# Patient Record
Sex: Male | Born: 1970 | Race: White | Hispanic: No | Smoking: Light tobacco smoker
Health system: Southern US, Community
[De-identification: ages and names within clinical notes are randomized; demographics above are authoritative.]

## PROBLEM LIST (undated history)

## (undated) DIAGNOSIS — L97509 Non-pressure chronic ulcer of other part of unspecified foot with unspecified severity: Secondary | ICD-10-CM

## (undated) DIAGNOSIS — N189 Chronic kidney disease, unspecified: Secondary | ICD-10-CM

## (undated) DIAGNOSIS — I10 Essential (primary) hypertension: Secondary | ICD-10-CM

## (undated) DIAGNOSIS — N289 Disorder of kidney and ureter, unspecified: Secondary | ICD-10-CM

## (undated) DIAGNOSIS — E119 Type 2 diabetes mellitus without complications: Secondary | ICD-10-CM

## (undated) DIAGNOSIS — G971 Other reaction to spinal and lumbar puncture: Secondary | ICD-10-CM

## (undated) DIAGNOSIS — Z992 Dependence on renal dialysis: Secondary | ICD-10-CM

## (undated) DIAGNOSIS — D649 Anemia, unspecified: Secondary | ICD-10-CM

## (undated) DIAGNOSIS — E11621 Type 2 diabetes mellitus with foot ulcer: Secondary | ICD-10-CM

## (undated) DIAGNOSIS — M869 Osteomyelitis, unspecified: Secondary | ICD-10-CM

## (undated) DIAGNOSIS — K219 Gastro-esophageal reflux disease without esophagitis: Secondary | ICD-10-CM

## (undated) HISTORY — PX: TOE AMPUTATION: SHX809

---

## 2010-06-17 ENCOUNTER — Emergency Department (HOSPITAL_COMMUNITY): Payer: Self-pay

## 2010-06-17 ENCOUNTER — Inpatient Hospital Stay (HOSPITAL_COMMUNITY)
Admission: EM | Admit: 2010-06-17 | Discharge: 2010-06-20 | DRG: 617 | Disposition: A | Discharge status: Home / Self Care | Payer: Self-pay | Attending: Family Medicine | Admitting: Family Medicine

## 2010-06-17 DIAGNOSIS — IMO0002 Reserved for concepts with insufficient information to code with codable children: Principal | ICD-10-CM | POA: Diagnosis present

## 2010-06-17 DIAGNOSIS — E1165 Type 2 diabetes mellitus with hyperglycemia: Principal | ICD-10-CM | POA: Diagnosis present

## 2010-06-17 DIAGNOSIS — M8610 Other acute osteomyelitis, unspecified site: Secondary | ICD-10-CM

## 2010-06-17 DIAGNOSIS — E785 Hyperlipidemia, unspecified: Secondary | ICD-10-CM | POA: Diagnosis present

## 2010-06-17 DIAGNOSIS — L97509 Non-pressure chronic ulcer of other part of unspecified foot with unspecified severity: Secondary | ICD-10-CM | POA: Diagnosis present

## 2010-06-17 DIAGNOSIS — I10 Essential (primary) hypertension: Secondary | ICD-10-CM | POA: Diagnosis present

## 2010-06-17 DIAGNOSIS — F17200 Nicotine dependence, unspecified, uncomplicated: Secondary | ICD-10-CM | POA: Diagnosis present

## 2010-06-17 DIAGNOSIS — L03039 Cellulitis of unspecified toe: Secondary | ICD-10-CM | POA: Diagnosis present

## 2010-06-17 DIAGNOSIS — M9080 Osteopathy in diseases classified elsewhere, unspecified site: Secondary | ICD-10-CM | POA: Diagnosis present

## 2010-06-17 DIAGNOSIS — M869 Osteomyelitis, unspecified: Secondary | ICD-10-CM | POA: Diagnosis present

## 2010-06-17 DIAGNOSIS — S98119A Complete traumatic amputation of unspecified great toe, initial encounter: Secondary | ICD-10-CM

## 2010-06-17 DIAGNOSIS — E118 Type 2 diabetes mellitus with unspecified complications: Secondary | ICD-10-CM

## 2010-06-17 DIAGNOSIS — L02619 Cutaneous abscess of unspecified foot: Secondary | ICD-10-CM | POA: Diagnosis present

## 2010-06-17 LAB — CBC
HCT: 34.7 % — ABNORMAL LOW (ref 39.0–52.0)
MCV: 85.3 fL (ref 78.0–100.0)
Platelets: 307 10*3/uL (ref 150–400)
RBC: 4.07 MIL/uL — ABNORMAL LOW (ref 4.22–5.81)
WBC: 7 10*3/uL (ref 4.0–10.5)

## 2010-06-17 LAB — BASIC METABOLIC PANEL
BUN: 13 mg/dL (ref 6–23)
Chloride: 101 mEq/L (ref 96–112)
Potassium: 4.1 mEq/L (ref 3.5–5.1)
Sodium: 134 mEq/L — ABNORMAL LOW (ref 135–145)

## 2010-06-17 LAB — GLUCOSE, CAPILLARY
Glucose-Capillary: 599 mg/dL (ref 70–99)
Glucose-Capillary: 428 mg/dL — ABNORMAL HIGH (ref 70–99)
Glucose-Capillary: 352 mg/dL — ABNORMAL HIGH (ref 70–99)
Glucose-Capillary: 277 mg/dL — ABNORMAL HIGH (ref 70–99)

## 2010-06-17 LAB — DIFFERENTIAL
Basophils Absolute: 0.1 10*3/uL (ref 0.0–0.1)
Eosinophils Absolute: 0.2 10*3/uL (ref 0.0–0.7)
Monocytes Relative: 9 % (ref 3–12)
Neutro Abs: 4.2 10*3/uL (ref 1.7–7.7)

## 2010-06-18 ENCOUNTER — Inpatient Hospital Stay (HOSPITAL_COMMUNITY): Payer: Self-pay

## 2010-06-18 LAB — BASIC METABOLIC PANEL
CO2: 28 mEq/L (ref 19–32)
Chloride: 103 mEq/L (ref 96–112)
GFR calc Af Amer: >60 mL/min (ref >60)
Potassium: 4.1 mEq/L (ref 3.5–5.1)
Sodium: 137 mEq/L (ref 135–145)

## 2010-06-18 LAB — CBC
Hemoglobin: 10.7 g/dL — ABNORMAL LOW (ref 13.0–17.0)
Platelets: 259 10*3/uL (ref 150–400)
RBC: 3.54 MIL/uL — ABNORMAL LOW (ref 4.22–5.81)
WBC: 6.8 10*3/uL (ref 4.0–10.5)

## 2010-06-18 LAB — GLUCOSE, CAPILLARY: Glucose-Capillary: 106 mg/dL — ABNORMAL HIGH (ref 70–99)

## 2010-06-18 LAB — MRSA PCR SCREENING: MRSA by PCR: NEGATIVE

## 2010-06-18 LAB — LIPID PANEL
Cholesterol: 160 mg/dL (ref 0–200)
LDL Cholesterol: 107 mg/dL — ABNORMAL HIGH (ref 0–99)
Total CHOL/HDL Ratio: 6.2 RATIO
Triglycerides: 134 mg/dL (ref <150)
VLDL: 27 mg/dL (ref 0–40)

## 2010-06-18 LAB — HEMOGLOBIN A1C
Hgb A1c MFr Bld: 15.2 % — ABNORMAL HIGH (ref <5.7)
Mean Plasma Glucose: 390 mg/dL — ABNORMAL HIGH (ref <117)

## 2010-06-18 MED ORDER — GADOBENATE DIMEGLUMINE 529 MG/ML IV SOLN
20.0000 mL | Freq: Once | INTRAVENOUS | Status: AC
  Administered 2010-06-18: 20 mL via INTRAVENOUS

## 2010-06-19 LAB — CBC
HCT: 32.2 % — ABNORMAL LOW (ref 39.0–52.0)
MCV: 87 fL (ref 78.0–100.0)
Platelets: 264 10*3/uL (ref 150–400)
RBC: 3.7 MIL/uL — ABNORMAL LOW (ref 4.22–5.81)
WBC: 6.6 10*3/uL (ref 4.0–10.5)

## 2010-06-19 LAB — GLUCOSE, CAPILLARY
Glucose-Capillary: 237 mg/dL — ABNORMAL HIGH (ref 70–99)
Glucose-Capillary: 225 mg/dL — ABNORMAL HIGH (ref 70–99)

## 2010-06-19 LAB — BASIC METABOLIC PANEL
BUN: 10 mg/dL (ref 6–23)
Chloride: 103 mEq/L (ref 96–112)
GFR calc non Af Amer: >60 mL/min (ref >60)
Potassium: 4.3 mEq/L (ref 3.5–5.1)
Sodium: 139 mEq/L (ref 135–145)

## 2010-06-20 LAB — CBC
HCT: 31.5 % — ABNORMAL LOW (ref 39.0–52.0)
Hemoglobin: 10.7 g/dL — ABNORMAL LOW (ref 13.0–17.0)
MCH: 30.1 pg (ref 26.0–34.0)
MCV: 88.5 fL (ref 78.0–100.0)
Platelets: 264 10*3/uL (ref 150–400)
RBC: 3.56 MIL/uL — ABNORMAL LOW (ref 4.22–5.81)
WBC: 7.1 10*3/uL (ref 4.0–10.5)

## 2010-06-20 LAB — GLUCOSE, CAPILLARY: Glucose-Capillary: 148 mg/dL — ABNORMAL HIGH (ref 70–99)

## 2010-06-20 LAB — BASIC METABOLIC PANEL
BUN: 9 mg/dL (ref 6–23)
CO2: 27 mEq/L (ref 19–32)
Chloride: 103 mEq/L (ref 96–112)
Creatinine, Ser: 1.12 mg/dL (ref 0.4–1.5)
Glucose, Bld: 240 mg/dL — ABNORMAL HIGH (ref 70–99)
Potassium: 4.2 mEq/L (ref 3.5–5.1)

## 2010-06-22 LAB — TISSUE CULTURE

## 2010-06-23 LAB — ANAEROBIC CULTURE

## 2010-06-24 NOTE — Op Note (Signed)
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  PROCEDURE:  Left second toe amputation through the metatarsophalangeal joint.  SURGEON:  Toni Arthurs, MD  ANESTHESIA:  General.  IV FLUIDS:  See anesthesia record.  ESTIMATED BLOOD LOSS:  Minimal.  TOURNIQUET TIME:  8 minutes with an ankle Esmarch.  COMPLICATIONS:  None apparent.  DISPOSITION:  Extubated awake and stable to recovery.  INDICATIONS FOR PROCEDURE:  The patient is a 40 year old male who presented to a local urgent care with a diabetic ulcer on second toe that has been there for more than 3 weeks.  He was also found to have a blood glucose greater than 600.  He was admitted to the hospital and plain films and an MRI were obtained showing osteomyelitis of the second toe under this severe ulcer.  I was consulted for treatment of this condition.  I explained the nature of this condition to the patient in detail.  We reviewed his treatment options including their risks and benefits.  He presents now for amputation of his second toe.  Due to evidence of flexor tenosynovitis and pyarthrosis of the PIP joint, the decision was made to amputate through the MTP joint.  He understands the risks and benefits of this procedure including specifically risks of bleeding, infection, nerve damage, blood clots, need for additional surgery, revision amputation, and death.  All of his questions were answered to his  satisfaction.  He elects now to have surgery.  PROCEDURE IN DETAIL:  After preoperative consent was obtained and the correct operative site was identified, the patient was brought to the operating room and placed supine on the operating table.  General anesthesia was induced.  Preoperative antibiotics were at therapeutic doses.  Surgical time-out was taken.  The left lower extremity was prepped and draped in standard sterile fashion.  The foot was exsanguinated and an Esmarch tourniquet was wrapped about the ankle.  A racket style incision was marked on the healthy skin around the base of the second toe.  This incision was marked to maintain a slightly longer plantar flap.  The previously marked incision was made and sharp dissection was carried down to the level of the bone.  Subperiosteal dissection was then carried along the shaft of the proximal phalanx to the MTP joint.  The toe was disarticulated at the MTP joint and passed off the field.  The flexor and extensor tendons were pulled out to their length and transected.  The tourniquet was released.  Hemostasis was achieved.  The wound was irrigated copiously.  There was no evidence of  residual infection within the flexor tendon sheath at this level.  There was no evidence of abscess or other subcutaneous tissue disease. Inverted simple sutures of 3-0 Monocryl were used to close the subcutaneous tissue, 3-0 nylon horizontal mattress sutures were used to loosely approximate the incision.  Sterile dressings were applied followed by a well-padded compression wrap.  Prior to application of dressings, 10 mL of 0.5% Marcaine were infiltrated into the subcutaneous tissue and around the digital nerves.  The patient was then awakened from anesthesia and transported to the recovery room in stable condition.  FOLLOWUP PLAN:  The patient will be weightbearing as tolerated in a hard sole shoe.  He will keep his foot elevated as much as possible.   He will be maintained on antibiotics pending the culture results.     Toni Arthurs, MD     JH/MEDQ  D:  06/18/2010  T:  06/19/2010  Job:  [id number]  Electronically Signed by Jonny Ruiz Yaniah Thiemann  on 06/24/2010 08:03:24 AM

## 2010-06-24 NOTE — Consult Note (Signed)
NAME:  Austin Mcdaniel, Austin Mcdaniel NO.:  [id number]  MEDICAL RECORD NO.:  [id number]           PATIENT TYPE:  E  LOCATION:  [facility]                         FACILITY:  [facility]  PHYSICIAN:  Toni Arthurs, MD        DATE OF BIRTH:  1970-11-08  DATE OF CONSULTATION:  06/18/2010 DATE OF DISCHARGE:                                CONSULTATION   REASON FOR CONSULTATION:  Left second toe ulcer.  HISTORY OF PRESENT ILLNESS:  The patient is a 40 year old male with past medical history significant for type 2 diabetes and a previous left hallux amputation.  He presented to an Urgent Care yesterday with a longstanding ulcer on the left second toe.  He was found to have extremely elevated blood glucose.  He was sent to the emergency department and was subsequently admitted as an inpatient.  X-rays on admission showed osteomyelitis of the distal phalanx.  An MRI was then obtained which confirmed osteomyelitis of the distal phalanx but also showed osteo of the middle phalanx and was suggestive for pyarthrosis of the PIP joint of the left second toe as well.  He denies ulcers on the other part of either foot.  He had his left hallux amputated in 2009 by Dr. Delford Field at [facility] in [facility].  He denies any recent fever, chills, nausea, vomiting, or changes in his appetite.  He denies any pain in his foot.  PAST MEDICAL HISTORY:  Type 2 diabetes.  PAST SURGICAL HISTORY:  Left hallux amputation in 2009, arthroscopy of the right knee in high school.  SOCIAL HISTORY:  The patient works in a warehouse.  He wears steel toed boots and started this job a month or so ago.  FAMILY HISTORY:  Positive for coronary artery disease and hypertension in his father, breast cancer in his grandmother.  SOCIAL HISTORY:  The patient dips approximately 2 times a week.  He drinks alcohol occasionally.  REVIEW OF SYSTEMS:  As above and otherwise negative.  PHYSICAL EXAMINATION:  The patient is a  well-nourished, well-developed male in no apparent distress.  He has quite poor dentition.  He is alert and oriented x4.  Mood and affect are normal. Extraocular motions are intact.  Respirations are unlabored.  The left foot has a previous well- healed hallux amputation.  The second toe is extremely swollen.  The distal end has a 1 cm open wound.  There is purulent fluid draining from the tip of the toe.  There is cellulitis about the toe to the level of the MTP joint.  The dorsalis pedis and posterior tibial pulses are 2+. He feels light touch throughout his foot.  He has 5/5 strength in plantarflexion and dorsiflexion of the ankle.  Skin is otherwise healthy and intact and there is no lymphadenopathy.  X-RAYS:  Reviewed his plain films and his MRI.  This shows osteomyelitis with destruction of the distal phalanx.  There is also increased signal at the middle phalanx and distal phalanx on the MRI as well as evidence of pyarthrosis and septic flexor tenosynovitis to the level of the PIP joint.  ASSESSMENT: 1.  Left second toe osteomyelitis. 2. Left second toe diabetic ulcer. 3. Status post left hallux amputation.  PLAN:  I explained the nature of this condition to the patient in detail.  At this point, treatment options include long-term IV antibiotics with wound care.  I think this would be suppressive only and would likely lead to recurrence and conceivably spread of infection further proximally.  The alternative is amputation of the toe.  Given the involvement of the PIP joint, I think this amputation should be done through the MTP joint as the disarticulation.  I think it can be closed loosely without undue risk.  He understands the risks and benefits of these various treatment options.  He would like to proceed with amputation of the second toe.  He understands specifically risks of bleeding, infection, nerve damage, blood clots, need for additional surgery, recurrent  infection, need for further amputation, and death. He elects to proceed.  We will make him n.p.o. at this point and hopefully get him scheduled for surgery later today.     Toni Arthurs, MD     JH/MEDQ  D:  06/18/2010  T:  06/19/2010  Job:  [id number]  Electronically Signed by Jonny Ruiz Rockelle Heuerman  on 06/24/2010 08:03:22 AM

## 2010-06-25 ENCOUNTER — Telehealth: Payer: Self-pay | Admitting: Family Medicine

## 2010-06-25 DIAGNOSIS — Z720 Tobacco use: Secondary | ICD-10-CM

## 2010-06-25 DIAGNOSIS — I10 Essential (primary) hypertension: Secondary | ICD-10-CM

## 2010-06-25 DIAGNOSIS — Z87891 Personal history of nicotine dependence: Secondary | ICD-10-CM | POA: Insufficient documentation

## 2010-06-25 DIAGNOSIS — E785 Hyperlipidemia, unspecified: Secondary | ICD-10-CM

## 2010-06-25 DIAGNOSIS — E1159 Type 2 diabetes mellitus with other circulatory complications: Secondary | ICD-10-CM

## 2010-06-25 LAB — CULTURE, ROUTINE-ABSCESS

## 2010-06-25 NOTE — Telephone Encounter (Signed)
Pt has no PCP discharged from hospital on 06/20/10. Set-up with f/u appt for blood draw to check Cr since he was started on an ACEi and to check CBG.

## 2010-07-01 ENCOUNTER — Other Ambulatory Visit: Payer: Self-pay

## 2010-07-01 DIAGNOSIS — E1159 Type 2 diabetes mellitus with other circulatory complications: Secondary | ICD-10-CM

## 2010-07-01 DIAGNOSIS — I10 Essential (primary) hypertension: Secondary | ICD-10-CM

## 2010-07-01 LAB — CREATININE, SERUM: Creat: 1.02 mg/dL (ref 0.40–1.50)

## 2010-07-01 LAB — GLUCOSE, CAPILLARY: Glucose-Capillary: 86 mg/dL (ref 70–99)

## 2010-07-01 NOTE — Progress Notes (Signed)
CREATNINE AND CBG DONE TODAY Austin Mcdaniel

## 2010-07-01 NOTE — Progress Notes (Signed)
CREATININE DRAWN GLUCOSE = 86 MG/DL BAJORDAN, MLS

## 2010-07-25 NOTE — Discharge Summary (Signed)
NAME:  Austin Mcdaniel, Austin Mcdaniel NO.:  [id number]  MEDICAL RECORD NO.:  [id number]           PATIENT TYPE:  E  LOCATION:  [facility]                         FACILITY:  [facility]  PHYSICIAN:  Pearlean Brownie, M.D.DATE OF BIRTH:  December 11, 1970  DATE OF ADMISSION:  06/17/2010 DATE OF DISCHARGE:  06/20/2010                              DISCHARGE SUMMARY   PRIMARY CARE PHYSICIAN:  Unassigned.  The patient has no PCP at the moment.  DISCHARGE DIAGNOSES: 1. Left second toe osteomyelitis status post left second toe     amputation to the metatarsophalangeal joint. 2. Diabetes type 2. 3. History of diabetic ulcer status post amputation of the left great     toe. 4. Hypertension. 5. Hyperlipidemia. 6. Tobacco dependence.  NEW MEDICATIONS AT DISCHARGE: 1. Tylenol 650 p.o. q.4 as needed for pain. 2. Aspirin 81 mg p.o. daily. 3. Ciprofloxacin 500 mg 1 tablet p.o. twice daily for the next 10     days. 4. Clindamycin 300 mg 1 tablet p.o. q.6 for the next 10 days. 5. Colace 100 mg 1 tablet p.o. twice daily. 6. Motrin [phone] mg p.o. q.6 as needed for pain. 7. Insulin 70/30 mix 15-25 units subcu twice daily.  The patient is     instructed to take 25 units in the morning with breakfast and lunch     and 15 units in the evening with dinner. 8. Lisinopril 10 mg p.o. daily. 9. Metformin XR 500 mg 1 tablet p.o. daily with supper. 10.Nicotine patch 14 mg one patch transdermally daily for the next     week, to step down to 7 mg in the following week. 11.Oxycodone 5 mg IR 1-2 tablets p.o. every 4 hours as needed for     pain, to take for the next 10 days. 12.Pravastatin 40 mg 1 tablet p.o. daily for the next 90 days. 13.The patient is provided with prescription for diabetic supplies     including lancets, glucose meter, insulin test strips. 14.Senokot 2 tablets p.o. twice daily.  PERTINENT LABS AT DISCHARGE:  The patient's wound culture; one abundant group B strep, moderate staph aureus.   Anaerobic culture, no anaerobes isolated.  Tissue culture; few staph aureus, moderate group B strep isolated.  Creatinine at discharge 1.12, glucose 240.  White count 7.1, hemoglobin 10.7, hematocrit 31.5, platelets 264.  A1c 15.2.  Fasting lipid panel; total cholesterol 160, HDL 26, LDL 107.  PERTINENT STUDIES AT DISCHARGE:  MRI of the left foot with contrast revealed osteomyelitis of the middle and distal phalangeal bone of the second toe, fluid in the flexor tendon sheath at the level of the proximal phalangeal bone of the second toe with a small effusion at the PIP joint, which could be affected.  CONSULT:  Orthopedic Surgery.  BRIEF HOSPITAL COURSE:  Mr. Campion is a 40 year old male with a known history of diabetes and diabetic ulcer who presents with elevated blood sugar and ulcer on the second toe of his left foot. 1. Left second toe osteomyelitis.  The patient's ulcer exams and     findings were consistent with osteomyelitis, physical exam, as well  as imaging concur with the diagnosis.  The patient was started on     IV vancomycin and Zosyn while an Ortho consult was obtained.  The     patient was evaluated by Orthopedic Surgery, Dr. Victorino Dike and it was     recommended the patient has the second left toe amputated.  The     patient had the amputation performed on June 19, 2010.  The     patient did well.  See Ortho op report for details of procedure.     The patient was transitioned from IV vancomycin and Zosyn to p.o.     Cipro and clindamycin at the date of discharge to cover both group     B strep as well as Pseudomonas and gram-negative.  The patient's     pain was well controlled during his hospital course.  He was     evaluated by Physical Therapy for any rehab needs and there were     none identified.  The patient at discharge was instructed to follow     up with Dr. Victorino Dike within the next 2 weeks. 2. Diabetes.  The patient has a known history of diabetes, but has      been without medications for the past 2 years secondary to lack of     insurance.  Blood sugar on admission was 600.  He was started on     insulin and his blood sugar came down, A1c is 15.2.  The patient     was transitioned to insulin Humulin 70/30, 25 units in the morning     and 15 units at night from a moderate sliding scale coverage.     Given the patient is a known diabetic, he was admitted with the     diagnosis as well as treatment and management of diabetes and it     was admitted with giving himself insulin as well as taking his own     blood sugars.  The patient's blood sugars responded to subcu     insulin and at discharge his latest blood sugar was 200.  The     patient was started on metformin as well XR 500 mg p.o. daily as an     insulin sensitizer. 3. Hypertension.  The patient has had elevated systolic blood     pressures on admission, it is usually in the 140s.  He had no known     history of hypertension and it was initially attributed to pain.     But as his pain was well controlled, his blood pressures remained     elevated.  The patient was started on ACE inhibitor, initially    lisinopril 10 mg p.o. daily for blood pressure control.  Creatinine     was 1.01 down to 0.93, discharge creatinine 1.12. 4. Hyperlipidemia.  The patient's fasting lipid profile is as     mentioned above.  The patient was therefore started on Pravachol 40     mg p.o. daily. 5. Tobacco abuse.  The patient was given nicotine patch 40 mg     transdermal daily to treat cravings.  The patient instructed to     continue nicotine patch at discharge.  DISCHARGE PHYSICAL EXAM:  At discharge, the patient is in no acute distress.  He is eager to go home.  He is ambulating well.  His foot was wrapped and he walks in padded foot.  Exam of his heart and lungs were all within normal  limits.  DISCHARGE INSTRUCTIONS:  The patient is discharged to home with instructions to increase activity slowly, to  consume a low-carbohydrate diet.  WOUND CARE:  He is instructed to leave the dressing on and to walk on the padded boot and to elevate his left foot as much as possible.  He is instructed to follow up with Dr. Victorino Dike in his clinic in 2 weeks.  FOLLOWUP ISSUES:  The patient was also provided prescription to have his blood drawn, to check his creatinine within 2 weeks, and instructed that he could be called in to the resident on-call for [facility]; however, following discharge, the patient was set up with a followup appointment at [facility] for blood draw and scheduled for Jul 01, 2010, between 8:45 and noon.  The patient was instructed to come in for his creatinine check and to have his blood sugar checks.  FOLLOWUP ISSUES:  The patient is also instructed to establish himself with a primary MD.  He was offered and given the information to follow up at the [facility] as well as home health department.  FOLLOWUP ISSUES AND RECOMMENDATIONS: 1. Follow up the patient's creatinine.  The patient started on     lisinopril.  Creatinine to be checked within the next 2 weeks. 2. Follow up the patient's CBGs.  The patient started on insulin, may     require a decreased dose as his infection resolved.  Follow up     fasting blood sugars.  DISCHARGE CONDITION:  The patient is discharged to home in stable medical condition.    ______________________________ Dessa Phi, MD   ______________________________ Pearlean Brownie, M.D.    JF/MEDQ  D:  06/25/2010  T:  06/26/2010  Job:  [id number]  Electronically Signed by Dessa Phi MD on 07/05/2010 03:13:08 PM Electronically Signed by Pearlean Brownie M.D. on 07/25/2010 09:37:10 AM

## 2010-07-25 NOTE — H&P (Signed)
NAME:  Austin Mcdaniel, Austin Mcdaniel NO.:  [id number]  MEDICAL RECORD NO.:  [id number]           PATIENT TYPE:  E  LOCATION:  [facility]                         FACILITY:  [facility]  PHYSICIAN:  Pearlean Brownie, M.D.DATE OF BIRTH:  06-Oct-1970  DATE OF ADMISSION:  06/17/2010 DATE OF DISCHARGE:                             HISTORY & PHYSICAL   PRIMARY CARE PHYSICIAN:  None, he is unassigned.  CHIEF COMPLAINT:  Foot pain.  HISTORY OF PRESENT ILLNESS:  This is a 40 year old male who presented to urgent care on [facility] with open left second toe wound. History of diabetes, previously on insulin, but has not had any meds secondary to finances greater than 1-1/2 years.  The patient started a new job 3 weeks ago which requires steel-toe boots.  Two weeks ago, he noticed a blister on the top of his second toe which he thought was from wearing the boot.  This subsequently broke down and became infected. The patient needs epsom salts and soap water to keep clean.  Upon evaluation in the ED, the patient denied any foot pain or any other systemic symptoms such as fever or chills.  CBG was noted to be 600 and the patient was started on glucose stabilizer.  There was no evidence of acidosis.  PAST MEDICAL HISTORY: 1. Diabetes mellitus, type 2. 2. History of osteomyelitis.  PAST SURGICAL HISTORY: 1. Amputation of left great toe in 2009. 2. Arthroscopy of the right knee as a teenager.  MEDICATIONS:  None.  ALLERGIES:  No known drug allergies.  FAMILY HISTORY:  Father is deceased from an MI and hypertension at age 21.  Mother is deceased from a car accident when the patient was 40 years old.  Paternal grandmother, breast cancer.  SOCIAL HISTORY:  The patient is separated, has 2 children.  He smokes cigars, also chews snuff.  Occasional alcohol.  No illicit drug use.  REVIEW OF SYSTEMS:  Negative for fever, chills, nausea, vomiting, chest pain, shortness of breath, or  abdominal pain.  No recent illness.  No dysuria.  No rash.  No change in bowel movements.  No change in mentation.  Positive decreased sensation change in the affected toe.  PHYSICAL EXAMINATION:  VITAL SIGNS:  Temperature 98, heart rate 80, respiratory rate 16, blood pressure 149/83, and O2 sat 99% on room air. GENERAL:  No acute distress, alert and oriented x3. HEENT:  PERL, EOMI, nonicteric.  Moist mucous membranes and oropharynx clear. NECK:  Supple. CARDIOVASCULAR:  Regular rate and rhythm.  No murmur. RESPIRATORY:  CTAB. ABDOMEN:  Normoactive bowel sounds.  Soft, nontender, and nondistended. EXTREMITIES:  Left foot status post great toe amputation.  Second digit on the left foot, positive swelling and erythema.  Positive probe at bone at tip with positive pus and bleeding.  Skin peeling on the medial aspect even piercing on his foot. NEURO:  Decreased sensation in the left second toe.  Sensation normal on the other digits.  Pulses are 2+ in lower extremity, radial 2+.  LABORATORY DATA:  Sodium 134, potassium 4.1, BUN 13, creatinine 1.01, and glucose 502.  White count 7.0, hemoglobin  17.1, hematocrit 34.7, and platelets 307.  Differential within normal limits.  X-ray of left foot, osteomyelitis of terminal tuft of the distal phalanx of second toe.  ASSESSMENT/PLAN:  This is a 40-year male admitted with presumptive osteomyelitis of the left second toe and hyperglycemia. 1. Osteomyelitis in the setting of uncontrolled diabetes mellitus.     Trauma with new job and boots likely initial culprit causing     blistering reaction.  Obtain MRI to confirm osteomyelitis and depth     of this infection.  Start vancomycin  and Zosyn.  Consult Ortho in     the morning.  No current signs of sepsis.  No culture sent of the     probing down to bone 2. Hyperglycemia, diabetes mellitus uncontrolled.  No meds.  Currently     without acidosis.  We will continue glucose stabilizer with CBG      less than 250.  We will give Lantus 10 units and allow the patient     to eat.  Check A1c.  Continue IV fluids at this time. 3. Nicotine dependence.  Nicotine patch given, smoking cessation. 4. Fluids, electrolytes, nutrition and gastrointestinal.  Per above.     IV fluids.  Lytes within normal limits. 5. Prophylaxis.  Heparin subcu 8 hours. 6. Disposition:  Pending clinical workup.     Milinda Antis, MD   ______________________________ Pearlean Brownie, M.D.    KD/MEDQ  D:  06/17/2010  T:  06/18/2010  Job:  [id number]  Electronically Signed by Milinda Antis MD on 07/19/2010 10:24:25 AM Electronically Signed by Pearlean Brownie M.D. on 07/25/2010 09:37:17 AM

## 2011-07-28 ENCOUNTER — Other Ambulatory Visit (HOSPITAL_COMMUNITY): Payer: Self-pay | Admitting: Plastic Surgery

## 2011-07-28 ENCOUNTER — Encounter (HOSPITAL_BASED_OUTPATIENT_CLINIC_OR_DEPARTMENT_OTHER): Payer: Self-pay | Attending: Plastic Surgery

## 2011-07-28 ENCOUNTER — Ambulatory Visit (HOSPITAL_COMMUNITY)
Admission: RE | Admit: 2011-07-28 | Discharge: 2011-07-28 | Disposition: A | Discharge status: Home / Self Care | Payer: Self-pay | Source: Ambulatory Visit | Attending: Plastic Surgery | Admitting: Plastic Surgery

## 2011-07-28 DIAGNOSIS — S98119A Complete traumatic amputation of unspecified great toe, initial encounter: Secondary | ICD-10-CM | POA: Insufficient documentation

## 2011-07-28 DIAGNOSIS — M79673 Pain in unspecified foot: Secondary | ICD-10-CM

## 2011-07-28 DIAGNOSIS — L97509 Non-pressure chronic ulcer of other part of unspecified foot with unspecified severity: Secondary | ICD-10-CM | POA: Insufficient documentation

## 2011-07-28 DIAGNOSIS — I10 Essential (primary) hypertension: Secondary | ICD-10-CM | POA: Insufficient documentation

## 2011-07-28 DIAGNOSIS — S98139A Complete traumatic amputation of one unspecified lesser toe, initial encounter: Secondary | ICD-10-CM | POA: Insufficient documentation

## 2011-07-28 DIAGNOSIS — E1169 Type 2 diabetes mellitus with other specified complication: Secondary | ICD-10-CM | POA: Insufficient documentation

## 2011-07-28 DIAGNOSIS — E785 Hyperlipidemia, unspecified: Secondary | ICD-10-CM | POA: Insufficient documentation

## 2011-07-28 DIAGNOSIS — M79609 Pain in unspecified limb: Secondary | ICD-10-CM | POA: Insufficient documentation

## 2011-07-28 LAB — GLUCOSE, CAPILLARY: Glucose-Capillary: 574 mg/dL (ref 70–99)

## 2011-07-28 NOTE — Progress Notes (Signed)
[facility]  NAME:  Austin Mcdaniel, MENTZER                ACCOUNT NO.:  [id number]  MEDICAL RECORD NO.:  [id number]      DATE OF BIRTH:  02-Jan-1971  PHYSICIAN:  Wayland Denis, DO       VISIT DATE:  07/28/2011                                  OFFICE VISIT   CHIEF COMPLAINT:  Left diabetic foot ulcer.  HISTORY OF PRESENT ILLNESS:  The patient is a 41 year old white male, who is here for evaluation of a left great toe amputated ulcer secondary to diabetes.  He underwent an amputation of the left great and second toe, one and three years ago.  It was healed up until the last couple of months when he noticed some blistering and the area opened.  He has been soaking it with Epsom salt and putting antibiotic ointment on it. Unfortunately, he does not have insurance so he did not seek care before now.  By his report, it is not getting any better and it does seem to be getting a bit worse.  He is not sure at this point what helps it.  MEDICATIONS:  Include ibuprofen.  PAST MEDICAL HISTORY:  Diabetes and lower extremity ulcers, hyperlipidemia, hypertension.  He is not on any medications for these 3 things.  REVIEW OF SYSTEMS:  Otherwise negative.  FAMILY HISTORY:  Separated and paying child support.  Not a smoker. 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  PHYSICAL EXAMINATION:  GENERAL:  The patient is alert, oriented, cooperative, not in any acute distress.  He is pleasant. HEENT:  Pupils are equal.  Extraocular muscles are intact. NECK:  No cervical lymphadenopathy. BREATHING:  Unlabored. HEART: Regular. ABDOMEN:  Soft. EXTREMITIES:  Upper extremity pulses are equal bilaterally.  Lower extremity pulses strong.  He has quite a bit of nonviable tissue around the great toe.  At this point, no bone is exposed; however, it is concerning for bone exposure.  There is a little bit of an odor, but no purulence coming from the area.  Recommend  [facility] for now with an x-ray to evaluate for possible osteo, elevation, multivitamin.  Check a hemoglobin A1c and have him follow up in 1 week.     Wayland Denis, DO     CS/MEDQ  D:  07/28/2011  T:  07/28/2011  Job:  [id number]

## 2011-08-12 NOTE — Progress Notes (Signed)
[facility]  NAME:  Austin Mcdaniel, Austin Mcdaniel                     ACCOUNT NO.:  MEDICAL RECORD NO.:  [id number]      DATE OF BIRTH:  05-15-1970  PHYSICIAN:  Wayland Denis, DO       VISIT DATE:  08/11/2011                                  OFFICE VISIT   The patient is a 41 year old male who is here for a followup on his left lower extremity foot ulcer.  He was using Silvercel on the area with some improvement.  No change in his medications or social history.  REVIEW OF SYSTEMS:  Otherwise negative.  PHYSICAL EXAMINATION:  GENERAL: He is alert, oriented, cooperative, not in any acute distress.  He is pleasant. EYES: Pupils are equal.  Extraocular muscles intact. NECK: No cervical lymphadenopathy. LUNGS: His breathing is unlabored. CARDIAC: His heart is regular. GI: His abdomen is soft.  The wound was debrided, and those notes are noted.  After that, collagen, Hydrogel alginate, and gauze with Kerlix and net was placed. We will have him continue that for the week and see him back in followup.     Wayland Denis, DO     CS/MEDQ  D:  08/11/2011  T:  08/11/2011  Job:  [id number]

## 2011-08-19 NOTE — Progress Notes (Signed)
[facility]  NAME:  Austin Mcdaniel, Austin Mcdaniel                     ACCOUNT NO.:  MEDICAL RECORD NO.:  [id number]      DATE OF BIRTH:  07-09-1970  PHYSICIAN:  Wayland Denis, DO       VISIT DATE:  08/18/2011                                  OFFICE VISIT   The patient is a 41 year old male who is here for followup on his left lower extremity ulcer.  We have been using collagen and alginate.  There is some improvement, but the main issue is that there is still pressure being placed on the area.  There is a small changes, but nothing really measurable. There has been no change in his medications or social history. Review of systems otherwise negative.  On exam, he is alert, oriented, cooperative, not in any acute distress. He is pleasant.  Pupils are equal.  Extraocular muscles are intact.  No cervical lymphadenopathy.  Breathing is unlabored.  Heart is regular. Abdomen is soft.  The wound is described above.  We will continue with collagen-alginate and casting.  We will have him follow up in a few days for change and then 1 week for re-evaluation.     Wayland Denis, DO     CS/MEDQ  D:  08/18/2011  T:  08/18/2011  Job:  [id number]

## 2011-08-25 ENCOUNTER — Encounter (HOSPITAL_BASED_OUTPATIENT_CLINIC_OR_DEPARTMENT_OTHER): Payer: Self-pay

## 2011-08-25 ENCOUNTER — Encounter (HOSPITAL_BASED_OUTPATIENT_CLINIC_OR_DEPARTMENT_OTHER): Payer: Self-pay | Attending: Plastic Surgery

## 2011-08-25 DIAGNOSIS — L97809 Non-pressure chronic ulcer of other part of unspecified lower leg with unspecified severity: Secondary | ICD-10-CM | POA: Insufficient documentation

## 2011-08-26 NOTE — Progress Notes (Signed)
[facility]  NAME:  Austin Mcdaniel, Austin Mcdaniel                ACCOUNT NO.:  [id number]  MEDICAL RECORD NO.:  [id number]      DATE OF BIRTH:  1970-12-17  PHYSICIAN:  Wayland Denis, DO       VISIT DATE:  08/25/2011                                  OFFICE VISIT   The patient is a 41 year old gentleman who is here for followup on his left lower extremity ulcer.  He was in a total noncontact cast this past week, which seems to have helped him a little bit.  He says it is a lot easier to walk-in than the [place].  There has been no change in his medications or social history.  REVIEW OF SYSTEMS:  Negative.  PHYSICAL EXAMINATION:  He is alert, oriented, and cooperative, not in any acute distress.  His breathing is unlabored.  His heart is regular. His lower extremity was debrided sharply and those notes are noted.  He does appear to have adequate blood flow and some granulating tissue at the base.  Unfortunately, he got a little blister on his fifth toe, and we will put some SilverCel on that with SilverCel on the wound and back in the total noncontact, and we will see him back in 1 week.  In the meantime, we have encouraged him to continue with his multivitamin and increase his protein.     Wayland Denis, DO     CS/MEDQ  D:  08/25/2011  T:  08/26/2011  Job:  [phone]

## 2011-09-01 NOTE — Progress Notes (Signed)
[facility]  NAME:  Austin Mcdaniel, Austin Mcdaniel                ACCOUNT NO.:  [id number]  MEDICAL RECORD NO.:  [id number]      DATE OF BIRTH:  [birth year]  PHYSICIAN:  Wayland Denis, DO            VISIT DATE:                                  OFFICE VISIT   The patient is a 41 year old gentleman, who is here for followup from his left lower extremity ulcer.  He was in a total noncontact cast over the past week, and his wound actually looks really good.  Unfortunately, he went to the beach in the area within the cast got extremely wet.  He states that he did not go into the water when it was just wet.  At any rate, it does not look like it has some improvement.  There is no change in social history.  REVIEW OF SYSTEMS:  Otherwise negative.  PHYSICAL EXAMINATION:  GENERAL:  He is alert, oriented, cooperative, not in any acute distress.  He is pleasant. HEENT:  Pupils equal.  Extraocular muscles intact.  No cervical or lymphadenopathy. LUNGS:  Breathing is unlabored. HEART:  Regular. ABDOMEN:  Soft.  The wound is improving, was debrided.  Recommend waiting a week and having him do calcium alginate, and follow up in 1 week.     Wayland Denis, DO     CS/MEDQ  D:  09/01/2011  T:  09/01/2011  Job:  [id number]

## 2011-09-05 ENCOUNTER — Ambulatory Visit: Payer: Self-pay | Admitting: Family Medicine

## 2011-09-16 NOTE — Progress Notes (Signed)
[facility]  NAME:  Austin Mcdaniel, Austin Mcdaniel                     ACCOUNT NO.:  MEDICAL RECORD NO.:  [id number]      DATE OF BIRTH:  05-17-1970  PHYSICIAN:  Wayland Denis, DO            VISIT DATE:                                  OFFICE VISIT   The patient is a 41 year old gentleman, here for followup on his left lower extremity ulcer.  He has been using Silvercel over the last week with pretty good improvement.  I think he would do much better with __________ but he does not want to wear it, says that he cannot sleep so.  No change in medications or social history.  REVIEW OF SYSTEMS:  Negative.  PHYSICAL EXAMINATION:  GENERAL:  He is alert, oriented, cooperative, not in any acute distress.  He is pleasant. HEENT:  Pupils equal.  Extraocular muscles intact. NECK:  No cervical lymphadenopathy.  Breathing is unlabored. HEART:  Regular. ABDOMEN:  Soft.  Wound improved with the measurements noted in the chart and we will continue with Silvercel, and see him back in a couple of weeks.     Wayland Denis, DO     CS/MEDQ  D:  09/15/2011  T:  09/16/2011  Job:  [id number]

## 2011-09-29 ENCOUNTER — Encounter (HOSPITAL_BASED_OUTPATIENT_CLINIC_OR_DEPARTMENT_OTHER): Payer: Self-pay | Attending: Plastic Surgery

## 2011-09-29 DIAGNOSIS — E1169 Type 2 diabetes mellitus with other specified complication: Secondary | ICD-10-CM | POA: Insufficient documentation

## 2011-09-29 DIAGNOSIS — L97509 Non-pressure chronic ulcer of other part of unspecified foot with unspecified severity: Secondary | ICD-10-CM | POA: Insufficient documentation

## 2011-09-30 NOTE — Progress Notes (Signed)
[facility]  NAME:  Austin Mcdaniel, Austin Mcdaniel                ACCOUNT NO.:  [id number]  MEDICAL RECORD NO.:  [id number]      DATE OF BIRTH:  10-28-1970  PHYSICIAN:  Wayland Denis, DO       VISIT DATE:  09/29/2011                                  OFFICE VISIT   The patient is a 41 year old gentleman who is here for followup on his plantar diabetic foot ulcer.  It is actually doing a little bit better with improvement in the overall size and granulation.  It does not appear to be infected, and there is no periwound irritation, infection, or swelling.  He has been trying to stay off it and does not smoke, but chews tobacco once in a while, and smokes cigars once in a while.  There is no change in his medical history or social history.  No change in medications.  REVIEW OF SYSTEMS:  Otherwise negative.  PHYSICAL EXAMINATION:  GENERAL:  He is alert, oriented, cooperative, not in any acute distress.  He is pleasant. HEENT:  Pupils are equal.  Extraocular muscles intact. NECK:  He does not have any cervical lymphadenopathy. LUNGS:  Breathing is unlabored. HEART:  Regular.  The wound is described above and noted in the nurse's note.  It was debrided and that is noted in the notes as well.  We will continue with the alginate, but add collagen as well, and have him follow up in a week.     Wayland Denis, DO     CS/MEDQ  D:  09/29/2011  T:  09/30/2011  Job:  [id number]

## 2012-01-02 ENCOUNTER — Encounter (HOSPITAL_COMMUNITY): Payer: Self-pay | Admitting: Emergency Medicine

## 2012-01-02 ENCOUNTER — Inpatient Hospital Stay (HOSPITAL_COMMUNITY): Payer: Self-pay

## 2012-01-02 ENCOUNTER — Emergency Department (HOSPITAL_COMMUNITY): Payer: Self-pay

## 2012-01-02 ENCOUNTER — Inpatient Hospital Stay (HOSPITAL_COMMUNITY)
Admission: EM | Admit: 2012-01-02 | Discharge: 2012-01-08 | DRG: 617 | Disposition: A | Discharge status: Home Health | Payer: No Typology Code available for payment source | Attending: Internal Medicine | Admitting: Internal Medicine

## 2012-01-02 DIAGNOSIS — Z91199 Patient's noncompliance with other medical treatment and regimen due to unspecified reason: Secondary | ICD-10-CM

## 2012-01-02 DIAGNOSIS — E11621 Type 2 diabetes mellitus with foot ulcer: Secondary | ICD-10-CM | POA: Diagnosis present

## 2012-01-02 DIAGNOSIS — F3289 Other specified depressive episodes: Secondary | ICD-10-CM | POA: Diagnosis present

## 2012-01-02 DIAGNOSIS — E118 Type 2 diabetes mellitus with unspecified complications: Secondary | ICD-10-CM

## 2012-01-02 DIAGNOSIS — Z89519 Acquired absence of unspecified leg below knee: Secondary | ICD-10-CM

## 2012-01-02 DIAGNOSIS — D638 Anemia in other chronic diseases classified elsewhere: Secondary | ICD-10-CM | POA: Diagnosis present

## 2012-01-02 DIAGNOSIS — F17200 Nicotine dependence, unspecified, uncomplicated: Secondary | ICD-10-CM | POA: Diagnosis present

## 2012-01-02 DIAGNOSIS — I10 Essential (primary) hypertension: Secondary | ICD-10-CM | POA: Diagnosis present

## 2012-01-02 DIAGNOSIS — Z720 Tobacco use: Secondary | ICD-10-CM

## 2012-01-02 DIAGNOSIS — L97509 Non-pressure chronic ulcer of other part of unspecified foot with unspecified severity: Secondary | ICD-10-CM

## 2012-01-02 DIAGNOSIS — E1149 Type 2 diabetes mellitus with other diabetic neurological complication: Secondary | ICD-10-CM | POA: Diagnosis present

## 2012-01-02 DIAGNOSIS — IMO0002 Reserved for concepts with insufficient information to code with codable children: Principal | ICD-10-CM | POA: Diagnosis present

## 2012-01-02 DIAGNOSIS — E785 Hyperlipidemia, unspecified: Secondary | ICD-10-CM | POA: Diagnosis present

## 2012-01-02 DIAGNOSIS — L089 Local infection of the skin and subcutaneous tissue, unspecified: Secondary | ICD-10-CM

## 2012-01-02 DIAGNOSIS — E871 Hypo-osmolality and hyponatremia: Secondary | ICD-10-CM | POA: Diagnosis not present

## 2012-01-02 DIAGNOSIS — L0390 Cellulitis, unspecified: Secondary | ICD-10-CM

## 2012-01-02 DIAGNOSIS — F411 Generalized anxiety disorder: Secondary | ICD-10-CM | POA: Diagnosis not present

## 2012-01-02 DIAGNOSIS — B9689 Other specified bacterial agents as the cause of diseases classified elsewhere: Secondary | ICD-10-CM | POA: Diagnosis present

## 2012-01-02 DIAGNOSIS — L97529 Non-pressure chronic ulcer of other part of left foot with unspecified severity: Secondary | ICD-10-CM | POA: Diagnosis present

## 2012-01-02 DIAGNOSIS — K219 Gastro-esophageal reflux disease without esophagitis: Secondary | ICD-10-CM | POA: Diagnosis present

## 2012-01-02 DIAGNOSIS — F329 Major depressive disorder, single episode, unspecified: Secondary | ICD-10-CM | POA: Diagnosis present

## 2012-01-02 DIAGNOSIS — N179 Acute kidney failure, unspecified: Secondary | ICD-10-CM | POA: Diagnosis present

## 2012-01-02 DIAGNOSIS — I96 Gangrene, not elsewhere classified: Secondary | ICD-10-CM | POA: Diagnosis present

## 2012-01-02 DIAGNOSIS — Z87891 Personal history of nicotine dependence: Secondary | ICD-10-CM | POA: Diagnosis present

## 2012-01-02 DIAGNOSIS — M9080 Osteopathy in diseases classified elsewhere, unspecified site: Secondary | ICD-10-CM | POA: Diagnosis present

## 2012-01-02 DIAGNOSIS — R739 Hyperglycemia, unspecified: Secondary | ICD-10-CM

## 2012-01-02 DIAGNOSIS — L02619 Cutaneous abscess of unspecified foot: Secondary | ICD-10-CM

## 2012-01-02 DIAGNOSIS — E1159 Type 2 diabetes mellitus with other circulatory complications: Secondary | ICD-10-CM | POA: Diagnosis present

## 2012-01-02 DIAGNOSIS — E1169 Type 2 diabetes mellitus with other specified complication: Principal | ICD-10-CM | POA: Diagnosis present

## 2012-01-02 DIAGNOSIS — D72829 Elevated white blood cell count, unspecified: Secondary | ICD-10-CM

## 2012-01-02 DIAGNOSIS — S98139A Complete traumatic amputation of one unspecified lesser toe, initial encounter: Secondary | ICD-10-CM

## 2012-01-02 DIAGNOSIS — M869 Osteomyelitis, unspecified: Secondary | ICD-10-CM | POA: Diagnosis present

## 2012-01-02 DIAGNOSIS — Z23 Encounter for immunization: Secondary | ICD-10-CM

## 2012-01-02 DIAGNOSIS — Z9119 Patient's noncompliance with other medical treatment and regimen: Secondary | ICD-10-CM

## 2012-01-02 DIAGNOSIS — D649 Anemia, unspecified: Secondary | ICD-10-CM

## 2012-01-02 HISTORY — DX: Osteomyelitis, unspecified: M86.9

## 2012-01-02 HISTORY — DX: Type 2 diabetes mellitus without complications: E11.9

## 2012-01-02 HISTORY — DX: Non-pressure chronic ulcer of other part of unspecified foot with unspecified severity: L97.509

## 2012-01-02 HISTORY — DX: Type 2 diabetes mellitus with foot ulcer: E11.621

## 2012-01-02 HISTORY — DX: Gastro-esophageal reflux disease without esophagitis: K21.9

## 2012-01-02 LAB — URINE MICROSCOPIC-ADD ON

## 2012-01-02 LAB — GLUCOSE, CAPILLARY: Glucose-Capillary: 389 mg/dL — ABNORMAL HIGH (ref 70–99)

## 2012-01-02 LAB — CBC WITH DIFFERENTIAL/PLATELET
Basophils Absolute: 0.1 10*3/uL (ref 0.0–0.1)
Eosinophils Absolute: 0.5 10*3/uL (ref 0.0–0.7)
Eosinophils Relative: 4 % (ref 0–5)
Lymphocytes Relative: 10 % — ABNORMAL LOW (ref 12–46)
Lymphs Abs: 1.2 10*3/uL (ref 0.7–4.0)
MCV: 83.2 fL (ref 78.0–100.0)
Neutrophils Relative %: 76 % (ref 43–77)
Platelets: 332 10*3/uL (ref 150–400)
RBC: 3.8 MIL/uL — ABNORMAL LOW (ref 4.22–5.81)
RDW: 11.6 % (ref 11.5–15.5)
WBC: 12 10*3/uL — ABNORMAL HIGH (ref 4.0–10.5)

## 2012-01-02 LAB — COMPREHENSIVE METABOLIC PANEL
ALT: 8 U/L (ref 0–53)
AST: 8 U/L (ref 0–37)
Alkaline Phosphatase: 89 U/L (ref 39–117)
CO2: 26 mEq/L (ref 19–32)
Calcium: 9.4 mg/dL (ref 8.4–10.5)
Potassium: 3.8 mEq/L (ref 3.5–5.1)
Sodium: 128 mEq/L — ABNORMAL LOW (ref 135–145)
Total Protein: 7.7 g/dL (ref 6.0–8.3)

## 2012-01-02 LAB — URINALYSIS, ROUTINE W REFLEX MICROSCOPIC
Ketones, ur: 15 mg/dL — AB
Specific Gravity, Urine: 1.025 (ref 1.005–1.030)
pH: 5 (ref 5.0–8.0)

## 2012-01-02 LAB — CBC
HCT: 31.6 % — ABNORMAL LOW (ref 39.0–52.0)
MCH: 29.8 pg (ref 26.0–34.0)
MCV: 83.4 fL (ref 78.0–100.0)
Platelets: 296 10*3/uL (ref 150–400)
RDW: 11.7 % (ref 11.5–15.5)

## 2012-01-02 LAB — LIPID PANEL
Cholesterol: 198 mg/dL (ref 0–200)
VLDL: 56 mg/dL — ABNORMAL HIGH (ref 0–40)

## 2012-01-02 MED ORDER — VANCOMYCIN HCL IN DEXTROSE 1-5 GM/200ML-% IV SOLN
1000.0000 mg | Freq: Once | INTRAVENOUS | Status: DC
  Filled 2012-01-02: qty 200

## 2012-01-02 MED ORDER — ONDANSETRON HCL 4 MG/2ML IJ SOLN
4.0000 mg | Freq: Once | INTRAMUSCULAR | Status: AC
  Administered 2012-01-05: 4 mg via INTRAVENOUS
  Filled 2012-01-02: qty 2

## 2012-01-02 MED ORDER — PIPERACILLIN-TAZOBACTAM 3.375 G IVPB
3.3750 g | Freq: Three times a day (TID) | INTRAVENOUS | Status: DC
  Administered 2012-01-03 – 2012-01-07 (×15): 3.375 g via INTRAVENOUS
  Filled 2012-01-02 (×16): qty 50

## 2012-01-02 MED ORDER — PIPERACILLIN-TAZOBACTAM 3.375 G IVPB
3.3750 g | Freq: Once | INTRAVENOUS | Status: AC
  Administered 2012-01-02: 3.375 g via INTRAVENOUS
  Filled 2012-01-02: qty 50

## 2012-01-02 MED ORDER — SODIUM CHLORIDE 0.9 % IV SOLN
INTRAVENOUS | Status: DC
  Administered 2012-01-05 – 2012-01-06 (×3): via INTRAVENOUS

## 2012-01-02 MED ORDER — HYDROCODONE-ACETAMINOPHEN 5-325 MG PO TABS
1.0000 | ORAL_TABLET | ORAL | Status: DC | PRN
  Administered 2012-01-02 – 2012-01-06 (×4): 2 via ORAL
  Filled 2012-01-02 (×4): qty 2

## 2012-01-02 MED ORDER — VANCOMYCIN HCL IN DEXTROSE 1-5 GM/200ML-% IV SOLN
1000.0000 mg | INTRAVENOUS | Status: AC
  Administered 2012-01-02: 1000 mg via INTRAVENOUS
  Filled 2012-01-02: qty 200

## 2012-01-02 MED ORDER — ACETAMINOPHEN 325 MG PO TABS
650.0000 mg | ORAL_TABLET | Freq: Four times a day (QID) | ORAL | Status: DC | PRN
  Administered 2012-01-02 – 2012-01-06 (×3): 650 mg via ORAL
  Filled 2012-01-02 (×2): qty 2

## 2012-01-02 MED ORDER — SODIUM CHLORIDE 0.9 % IV BOLUS (SEPSIS): 1000.0000 mL | Freq: Once | INTRAVENOUS | Status: DC

## 2012-01-02 MED ORDER — VANCOMYCIN HCL IN DEXTROSE 1-5 GM/200ML-% IV SOLN
1000.0000 mg | Freq: Two times a day (BID) | INTRAVENOUS | Status: DC
  Administered 2012-01-03 – 2012-01-05 (×5): 1000 mg via INTRAVENOUS
  Filled 2012-01-02 (×7): qty 200

## 2012-01-02 MED ORDER — ACETAMINOPHEN 650 MG RE SUPP: 650.0000 mg | Freq: Four times a day (QID) | RECTAL | Status: DC | PRN

## 2012-01-02 MED ORDER — GADOBENATE DIMEGLUMINE 529 MG/ML IV SOLN
20.0000 mL | Freq: Once | INTRAVENOUS | Status: AC | PRN
  Administered 2012-01-02: 20 mL via INTRAVENOUS

## 2012-01-02 MED ORDER — VANCOMYCIN HCL IN DEXTROSE 1-5 GM/200ML-% IV SOLN
1000.0000 mg | INTRAVENOUS | Status: DC
  Filled 2012-01-02: qty 200

## 2012-01-02 MED ORDER — INSULIN ASPART 100 UNIT/ML ~~LOC~~ SOLN
0.0000 [IU] | Freq: Three times a day (TID) | SUBCUTANEOUS | Status: DC
  Administered 2012-01-03: 6 [IU] via SUBCUTANEOUS
  Administered 2012-01-03 (×2): 15 [IU] via SUBCUTANEOUS
  Administered 2012-01-04: 11 [IU] via SUBCUTANEOUS
  Administered 2012-01-04 (×2): 7 [IU] via SUBCUTANEOUS
  Administered 2012-01-05 (×2): 11 [IU] via SUBCUTANEOUS
  Administered 2012-01-06: 4 [IU] via SUBCUTANEOUS
  Administered 2012-01-06: 7 [IU] via SUBCUTANEOUS
  Administered 2012-01-07 (×2): 4 [IU] via SUBCUTANEOUS

## 2012-01-02 MED ORDER — INSULIN ASPART 100 UNIT/ML ~~LOC~~ SOLN
6.0000 [IU] | Freq: Three times a day (TID) | SUBCUTANEOUS | Status: DC
  Administered 2012-01-03 – 2012-01-05 (×6): 6 [IU] via SUBCUTANEOUS

## 2012-01-02 MED ORDER — ENOXAPARIN SODIUM 40 MG/0.4ML ~~LOC~~ SOLN
40.0000 mg | SUBCUTANEOUS | Status: DC
  Administered 2012-01-02 – 2012-01-05 (×4): 40 mg via SUBCUTANEOUS
  Filled 2012-01-02 (×4): qty 0.4

## 2012-01-02 NOTE — ED Notes (Signed)
Called report to Bev.

## 2012-01-02 NOTE — ED Provider Notes (Signed)
History     CSN: [id number]  Arrival date & time 01/02/12  1050   First MD Initiated Contact with Patient 01/02/12 1215      No chief complaint on file.   (Consider location/radiation/quality/duration/timing/severity/associated sxs/prior treatment) HPI Comments: Patient presents with pain, swelling, draining, redness to his left foot for the past several weeks. Is a history of diabetes and has not been on his medications for the past year secondary to insurance issues. He states he has a small ulceration on the bottom of his foot 2 weeks ago it has spread up to the dorsum of his foot and has foul-smelling drainage and discharge. 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 Denies any fever, chills, nausea or vomiting. States his sugars have been high.  The history is provided by the patient.    Past Medical History  Diagnosis Date  . Diabetes mellitus without complication   . Acid reflux     No past surgical history on file.  No family history on file.  History  Substance Use Topics  . Smoking status: Current Some Day Smoker  . Smokeless tobacco: Not on file  . Alcohol Use: Yes      Review of Systems  Constitutional: Positive for activity change and appetite change. Negative for fever.  HENT: Negative for congestion and rhinorrhea.   Respiratory: Negative for cough, chest tightness and shortness of breath.   Cardiovascular: Negative for chest pain.  Gastrointestinal: Negative for nausea, vomiting and abdominal pain.  Genitourinary: Negative for dysuria and testicular pain.  Musculoskeletal: Positive for myalgias and arthralgias. Negative for back pain.  Skin: Negative for rash.  Neurological: Negative for dizziness, weakness and headaches.    Allergies  Review of patient's allergies indicates no known allergies.  Home Medications   No current outpatient prescriptions on file.  BP 155/83  Pulse 72  Temp 97.9 F (36.6 C) (Oral)  Resp 18  Ht 6'  1" (1.854 m)  Wt 225 lb (102.059 kg)  BMI 29.69 kg/m2  SpO2 99%  Physical Exam  Constitutional: He is oriented to person, place, and time. He appears well-developed and well-nourished. No distress.  HENT:  Head: Normocephalic.  Mouth/Throat: Oropharynx is clear and moist. No oropharyngeal exudate.  Eyes: Conjunctivae normal are normal. Pupils are equal, round, and reactive to light.  Neck: Normal range of motion.  Cardiovascular: Normal rate, regular rhythm and normal heart sounds.   No murmur heard. Abdominal: Soft. There is no tenderness. There is no rebound and no guarding.  Musculoskeletal: He exhibits edema and tenderness.       Patient has foul-smelling drainage from the left forefoot involving previous amputation site of the second and first toe. There is drainage from dorsum and plantar surface with fluctuance. 1 cm ulceration to plantar surface. +2 DP and PT pulse streaking erythema of the ankle.,   Neurological: He is alert and oriented to person, place, and time. No cranial nerve deficit.  Skin: Skin is warm.    ED Course  Procedures (including critical care time)  Labs Reviewed  GLUCOSE, CAPILLARY - Abnormal; Notable for the following:    Glucose-Capillary 406 (*)     All other components within normal limits  CBC WITH DIFFERENTIAL - Abnormal; Notable for the following:    WBC 12.0 (*)     RBC 3.80 (*)     Hemoglobin 11.4 (*)     HCT 31.6 (*)     MCHC 36.1 (*)     Neutro  Abs 9.1 (*)     Lymphocytes Relative 10 (*)     Monocytes Absolute 1.1 (*)     All other components within normal limits  COMPREHENSIVE METABOLIC PANEL - Abnormal; Notable for the following:    Sodium 128 (*)     Chloride 89 (*)     Glucose, Bld 423 (*)     BUN 39 (*)     Creatinine, Ser 1.96 (*)     Albumin 2.6 (*)     Total Bilirubin 0.2 (*)     GFR calc non Af Amer 41 (*)     GFR calc Af Amer 47 (*)     All other components within normal limits  URINALYSIS, ROUTINE W REFLEX MICROSCOPIC  - Abnormal; Notable for the following:    APPearance HAZY (*)     Glucose, UA >1000 (*)     Hgb urine dipstick MODERATE (*)     Bilirubin Urine SMALL (*)     Ketones, ur 15 (*)     Protein, ur 100 (*)     All other components within normal limits  URINE MICROSCOPIC-ADD ON - Abnormal; Notable for the following:    Casts GRANULAR CAST (*)     All other components within normal limits  CBC - Abnormal; Notable for the following:    RBC 3.79 (*)     Hemoglobin 11.3 (*)     HCT 31.6 (*)     All other components within normal limits  CREATININE, SERUM - Abnormal; Notable for the following:    Creatinine, Ser 1.90 (*)     GFR calc non Af Amer 42 (*)     GFR calc Af Amer 49 (*)     All other components within normal limits  LACTIC ACID, PLASMA  CULTURE, BLOOD (ROUTINE X 2)  CULTURE, BLOOD (ROUTINE X 2)  WOUND CULTURE  BASIC METABOLIC PANEL  CBC      1. Diabetic foot infection   2. Hyperglycemia   3. Cellulitis       MDM  Diabetic foot infection with uncontrolled sugars and noncompliance. No fevers, vitals stable, doesn't appear septic.  Broad spectrum antibiotics, cultures, xrays.  No gas on Xrays, no evidence of osteo. Pulses intact. Needs admission for antibiotics, sugar control, ortho consult for debridement v amputation.        Glynn Octave, MD 01/02/12 [phone]

## 2012-01-02 NOTE — ED Notes (Signed)
Has out of diabetes  meds x 1 year

## 2012-01-02 NOTE — Progress Notes (Signed)
ANTIBIOTIC CONSULT NOTE - INITIAL  Pharmacy Consult for vancomycin/zosyn Indication: diabetic left foot ulcer w/ suspected osteo  No Known Allergies  Patient Measurements:   Vital Signs: Temp: 98.5 F (36.9 C) (11/08 1444) Temp src: Oral (11/08 1444) BP: 157/87 mmHg (11/08 1645) Pulse Rate: 78  (11/08 1645) Intake/Output from previous day:   Intake/Output from this shift:    Labs:  Basename 01/02/12 1225  WBC 12.0*  HGB 11.4*  PLT 332  LABCREA --  CREATININE 1.96*   CrCl is unknown because there is no height on file for the current visit.  Microbiology: No results found for this or any previous visit (from the past 720 hour(s)).  Medical History: Past Medical History  Diagnosis Date  . Diabetes mellitus without complication   . Acid reflux     Medications:  Prescriptions prior to admission  Medication Sig Dispense Refill  . omeprazole (PRILOSEC) 20 MG capsule Take 20 mg by mouth daily.      Marland Kitchen sulfamethoxazole-trimethoprim (BACTRIM DS) 800-160 MG per tablet Take 1 tablet by mouth 2 (two) times daily.       Assessment: 41 yo male with diabetic left foot ulcer w/ suspected osteo to start vancomycin and zosyn. He received zosyn and vancomycin 1gm in the ED at around 4:30pm. SCr= 1.96 and CrCl~50.  Goal of Therapy:  Vancomycin trough level 15-20 mcg/ml  Plan:  -Zosyn 3.375gm IV q8h and vancomycin 1000mg  x1 (for a 2000mg  total load) followed by 1000mg  q12h. -Will follow renal function, cultures and vancomycin levels as needed  Harland German, Ilda Basset D 01/02/2012 5:56 PM

## 2012-01-02 NOTE — H&P (Signed)
Hospital Admission Note Date: 01/02/2012  Patient name: Austin Mcdaniel Medical record number: [id number] Date of birth: 1970/10/14 Age: 41 y.o. Gender: male PCP: Dessa Phi, MD  Medical Service: Internal Medicine Teaching Service  Attending physician:  Dr. Meredith Pel    1st Contact: Dr. Sherrine Maples   Pager: [phone] 2nd Contact: Dr. Bosie Clos   Pager: [phone] After 5 pm or weekends: 1st Contact:      Pager: [phone] 2nd Contact:      Pager: [phone]  Chief Complaint: left foot ulceration  History of Present Illness: 41 yo M with PMH DM II with multiple poorly healing foot ulcers, HTN, and HLD, who presents to the ED with worsening left foot ulceration and fluctuance. He states that in May he had a poorly healing plantar foot ulcer and required a diabetic pressure boot. The wound began to heal, but in September, it began worsening. He started wearing the boot again around this time. He states that just over the past day or 2 he developed a large ulcer on the dorsum of his foot that has worsened. His foot has become red, tender to palpation, and has been draining purulent material. He is supposed to be on insulin, but has not able to afford his medications for the past year. He is unsure what his blood sugars have been running.  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 Denies any fever, chills, nausea or vomiting.    Meds: No current outpatient prescriptions on file.  Allergies: Allergies as of 01/02/2012  . (No Known Allergies)   Past Medical History  Diagnosis Date  . Diabetes mellitus without complication   . Acid reflux    No past surgical history on file. No family history on file. History   Social History  . Marital Status: Legally Separated    Spouse Name: N/A    Number of Children: N/A  . Years of Education: N/A   Occupational History  . Not on file.   Social History Main Topics  . Smoking  status: Current Some Day Smoker  . Smokeless tobacco: Not on file  . Alcohol Use: Yes  . Drug Use:   . Sexually Active:    Other Topics Concern  . Not on file   Social History Narrative  . No narrative on file    Review of Systems: Constitutional: Denies fever, chills, diaphoresis, appetite change or fatigue.  HEENT: Denies photophobia, eye pain, redness, hearing loss, ear pain, congestion, sore throat, rhinorrhea, sneezing, mouth sores, trouble swallowing, neck pain, neck stiffness or tinnitus.  Respiratory: Denies SOB, DOE, cough, chest tightness, or wheezing.  Cardiovascular: Denies chest pain, palpitations or leg swelling.  Gastrointestinal: Denies nausea, vomiting, abdominal pain, diarrhea, constipation, blood in stool or abdominal distention.  Genitourinary: Denies dysuria, urgency, frequency, hematuria, flank pain or difficulty urinating.  Musculoskeletal: Denies myalgias, back pain, joint swelling, arthralgias or gait problem.  Skin: Recurrent left foot ulcerations, left 1st and 2nd toe amputation.  Neurological: Denies dizziness, seizures, syncope, weakness, light-headedness, numbness or headaches.  Psychiatric/Behavioral: Denies suicidal ideation, mood changes, confusion, nervousness, sleep disturbance or agitation   Physical Exam: Blood pressure 155/83, pulse 72, temperature 97.9 F (36.6 C), temperature source Oral, resp. rate 18, SpO2 99.00%. General: Alert & oriented x 3, well-developed, and cooperative on examination.  Head: Normocephalic and atraumatic.  Eyes: PERRL, EOMI, no injection and anicteric.  Mouth: Pharynx pink and moist.  Neck: Supple, full ROM.  Lungs: CTAB, normal respiratory effort, no accessory muscle use, no crackles, and no wheezes. Heart: Regular rate, regular rhythm, no murmur, no gallop, and no rub.  Abdomen: Soft, non-tender, non-distended, no guarding, no rebound tenderness, no organomegaly.  Msk: No joint swelling, warmth, or erythema.    Extremities: Left foot with erythema TTP, 4-5 circumferential eschar on dorsum of foot. Fluctuance in tissue around eschar with large blister proximal to left 5th toe. Fluctuance at stump of left foot. 1-2cm plantar ulcer the the 1st toe region, does not appear to reach the bone. 2+ radial and DP pulses bilaterally. No cyanosis, clubbing, edema Neurologic: Alert & oriented X3, cranial nerves II-XII intact, strength normal in all extremities. Decreased sensation in left foot.  Psych: Memory intact for recent and remote, normally interactive, good eye contact, not anxious appearing, and not depressed appearing.   Lab results: Basic Metabolic Panel:  Basename 01/02/12 1737 01/02/12 1225  NA -- 128*  K -- 3.8  CL -- 89*  CO2 -- 26  GLUCOSE -- 423*  BUN -- 39*  CREATININE 1.90* 1.96*  CALCIUM -- 9.4  MG -- --  PHOS -- --   Liver Function Tests:  Basename 01/02/12 1225  AST 8  ALT 8  ALKPHOS 89  BILITOT 0.2*  PROT 7.7  ALBUMIN 2.6*   CBC:  Basename 01/02/12 1737 01/02/12 1225  WBC 10.0 12.0*  NEUTROABS -- 9.1*  HGB 11.3* 11.4*  HCT 31.6* 31.6*  MCV 83.4 83.2  PLT 296 332   CBG:  Basename 01/02/12 1116  GLUCAP 406*   Urinalysis:  Basename 01/02/12 1420  COLORURINE YELLOW  LABSPEC 1.025  PHURINE 5.0  GLUCOSEU >1000*  HGBUR MODERATE*  BILIRUBINUR SMALL*  KETONESUR 15*  PROTEINUR 100*  UROBILINOGEN 0.2  NITRITE NEGATIVE  LEUKOCYTESUR NEGATIVE    Imaging results:  Dg Chest 2 View  01/02/2012  *RADIOLOGY REPORT*  Clinical Data: Fever, diabetes  CHEST - 2 VIEW  Comparison: None.  Findings: Cardiomediastinal silhouette is unremarkable.  No acute infiltrate or pleural effusion.  No pulmonary edema.  Bony thorax is unremarkable.  IMPRESSION: No active disease.   Original Report Authenticated By: Natasha Mead, M.D.    Dg Ankle Complete Left  01/02/2012  *RADIOLOGY REPORT*  Clinical Data: Ankle region soft tissue ulcer  LEFT ANKLE COMPLETE - 3+ VIEW  Comparison: None.   Findings: Frontal, lateral, and oblique views were obtained.  There is no fracture or effusion.  Ankle mortise appears intact.  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  No erosive change or bony destruction.  No soft tissue abscess seen.  IMPRESSION: Osteoarthritic change.  No fracture or joint effusion. No bony destruction.  No soft tissue abscess appreciated.   Original Report Authenticated By: Bretta Bang, M.D.    Dg Foot Complete Left  01/02/2012  *RADIOLOGY REPORT*  Clinical Data: Left foot ulcer.  LEFT FOOT - COMPLETE 3+ VIEW  Comparison: July 28, 2011.  Findings: Status post amputation of first and second toes. Chronic changes of the distal third and fourth metatarsals are noted which are unchanged compared to prior exam.  These most likely due to prior osteomyelitis. Swelling and gas is noted in the dorsal soft tissues overlying the metatarsals consistent with infection.  IMPRESSION: Findings consistent with cellulitis or ulcer involving the dorsal soft tissues.  No definite evidence of acute osteomyelitis is noted.   Original Report Authenticated By: Lupita Raider.,  M.D.     Other results: EKG: 06/19/11: Sinus bradycardia  Assessment & Plan by Problem:  1. Diabetic ulceration with infection of the left foot: Siginificant infection of the left foot, concerning for possible osteomyelitis. Checking MRI of the foot. Vanc and Zosyn have been started. Wound cultures were obtained. Orthopedic Surgery has been consulted. - Cotninue abx - F/u MRI - F/u with Ortho - Wound Care consult - Vicodin for pain control  2. DM: Pt supposed to be on insulin, but unable to afford for the past year or more. CBG in ED 423. Will start insulin with resistant SSI for coverage. - Novolog 6u TID w/ meals - Resistant SSI coverage - Diabetes educator consult  3. AKI: Cr 1.96 on admssion. Last Cr was 1.0. Na is 128. Will check FeNa to determine if this is pre, post, or intrinsic.  4. HTN:   BP moderately elevated in the ED. Pt not on any medications due to financial constraints. Will continue to monitor.  5. DVT PPX: Lovenox  Signed: Genelle Gather 01/02/2012, 6:42 PM

## 2012-01-02 NOTE — Consult Note (Signed)
Austin Phi, MD Chief Complaint: Foot ulcer History: Austin Mcdaniel is a 41 year old male with a history of uncontrolled DM with neuropathy, multiple toe amputations (Left Great toe in 2009, left 2nd toe in 2012) due to osteomyelitis, hyperlipidemia, HTN. Patient presents to the ED with worsening ulcer on his left dorsal foot. He reports this complaint started in may with a small ulcer on the plantar aspect of his left foot. He was able to receive care for this ulcer and he reports it was almost healed when 2 months ago it became worse looking. He then began to care for it himself by using a medical boot he already had. He reports it was improving until this past week when he took off the boot and noticed the new ulcer under the strap. In the past week the ulcer has grown in size and become more tender, prompting him to come to the ED. Patient denies any fever, chills, nausea, vomiting.  Past Medical History  Diagnosis Date  . Diabetes mellitus without complication   . Acid reflux     No Known Allergies  No current facility-administered medications on file prior to encounter.   Current Outpatient Prescriptions on File Prior to Encounter  Medication Sig Dispense Refill  . omeprazole (PRILOSEC) 20 MG capsule Take 20 mg by mouth daily.        Physical Exam: Filed Vitals:   01/02/12 1700  BP: 155/83  Pulse: 72  Temp: 97.9 F (36.6 C)  Resp: 18   A+O x3  NO SOB/CP Abd soft/nt Decreased sensation to LT in LE  Bilaterally Decreased pulses to palpation Ulceration to dorsal aspect of the midfoot Positive drainage/foul odor/ erythema No hip/knee/ankle pain with ROM  Image: Dg Chest 2 View  01/02/2012  *RADIOLOGY REPORT*  Clinical Data: Fever, diabetes  CHEST - 2 VIEW  Comparison: None.  Findings: Cardiomediastinal silhouette is unremarkable.  No acute infiltrate or pleural effusion.  No pulmonary edema.  Bony thorax is unremarkable.  IMPRESSION: No active disease.   Original Report  Authenticated By: Natasha Mead, M.D.    Dg Ankle Complete Left  01/02/2012  *RADIOLOGY REPORT*  Clinical Data: Ankle region soft tissue ulcer  LEFT ANKLE COMPLETE - 3+ VIEW  Comparison: None.  Findings: Frontal, lateral, and oblique views were obtained.  There is no fracture or effusion.  Ankle mortise appears intact.  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  No erosive change or bony destruction.  No soft tissue abscess seen.  IMPRESSION: Osteoarthritic change.  No fracture or joint effusion. No bony destruction.  No soft tissue abscess appreciated.   Original Report Authenticated By: Bretta Bang, M.D.    Mr Foot Left W Wo Contrast  01/02/2012  *RADIOLOGY REPORT*  Clinical Data: Blistering bottom of foot.  Prior amputations. Diabetes.  MRI OF THE LEFT FOREFOOT WITHOUT AND WITH CONTRAST  Technique:  Multiplanar, multisequence MR imaging was performed both before and after administration of intravenous contrast.  Contrast: 20mL MULTIHANCE GADOBENATE DIMEGLUMINE 529 MG/ML IV SOLN  Comparison: 01/02/2012 radiographs  Findings: Prior amputations of the first and second toes at the MTP joints observed.  Extensive irregular abscess noted dorsal and distal to the first and second metatarsals, with abnormal but low-level edema in the second metatarsal favoring chronic osteomyelitis, and deformity the head of the second metatarsal.  There is deformity, irregularity, and erosion of the heads of the third and fourth metatarsals which could be from the erosive arthropathy or fracture, but with osteomyelitis  also considered a possibility.  However, there is little in the way of edema in the adjacent third and fourth metatarsal shafts, and no phalangeal osteomyelitis is observed.  As expected, there is extensive surrounding infiltrative subcutaneous edema and enhancement particularly around the abscesses but also tracking in the dorsum of the foot.  Gas tracks within the dorsal abscess and within the  distal portions of the abscess, and I suspect draining sinus tracts along the amputation bed of the first and second toes.  On image 29 of series 10, draining sinus tract to the medial ball of the foot is also suggested.  Lisfranc ligament is intact.  Visualized midfoot structures intact.  Edema and enhancement tracks along the plantar musculature of the foot.  IMPRESSION:  1.  Considerable geographic abscess dorsal to the first and second metatarsals, tracking in the thickened and enhancing soft tissues, and also extending distal to the head of the first and second metatarsals.  There is probably some drainage along the amputation bed, and there is considerable gas within the abscess. 2.  Low-level edema and enhancement in the second metatarsal, favoring low-level osteomyelitis. 3.  Deformities and erosions of the heads of the third and fourth metatarsals, chronic since June 2013, potentially related to osteomyelitis, the erosive arthropathy, or prior fractures. However, there is not currently significant edema tracks in the shaft of the third and fourth metatarsals.  4.  Extensive regional cellulitis, especially dorsally. 5.  Edema and enhancement tracking along the plantar musculature of the foot, compatible with myositis/fasciitis   Original Report Authenticated By: Gaylyn Rong, M.D.    Dg Foot Complete Left  01/02/2012  *RADIOLOGY REPORT*  Clinical Data: Left foot ulcer.  LEFT FOOT - COMPLETE 3+ VIEW  Comparison: July 28, 2011.  Findings: Status post amputation of first and second toes. Chronic changes of the distal third and fourth metatarsals are noted which are unchanged compared to prior exam.  These most likely due to prior osteomyelitis. Swelling and gas is noted in the dorsal soft tissues overlying the metatarsals consistent with infection.  IMPRESSION: Findings consistent with cellulitis or ulcer involving the dorsal soft tissues.  No definite evidence of acute osteomyelitis is noted.   Original  Report Authenticated By: Lupita Raider.,  M.D.     A/P:  Patient with previous toe amputation with 3 day history of ulceration and drainage from left mid-foot lesion. Question local I+D vs amputation Patient is not clinically septic at present so emergent surgical debridement is not needed Will discuss case with Dr Victorino Dike for definitive management Agree with MRI Also order LE ABI to evaluate vascular supply and potential for healing Agree with wound nsg eval.

## 2012-01-02 NOTE — ED Notes (Signed)
In may had a bump got infected  Got better  Left foot ,  Bump came back late august has gotten worse worse swollenand  Draining  This week

## 2012-01-02 NOTE — H&P (Signed)
[facility] Note Date: 01/02/2012  Patient name: Austin Mcdaniel Medical record number: [id number] Date of birth: 26-Nov-1970 Age: 41 y.o. Gender: male PCP: Dessa Phi, MD  Medical Service: Internal Medicine Teaching Service B1  Attending physician: Margarito Liner, MD     Chief Complaint: Worsening Foot Ulcer  History of Present Illness:  Austin Mcdaniel is a 41 year old male with a history of uncontrolled DM with neuropathy, multiple toe amputations (Left Great toe in 2009, left 2nd toe in 2012) due to osteomyelitis, hyperlipidemia, HTN.  Patient presents to the ED with worsening ulcer on his left dorsal foot.  He reports this complaint started in may with a small ulcer on the plantar aspect of his left foot.  He was able to receive care for this ulcer and he reports it was almost healed when 2 months ago it became worse looking.  He then began to care for it himself by using a medical boot he already had.  He reports it was improving until this past week when he took off the boot and noticed the new ulcer under the strap.  In the past week the ulcer has grown in size and become more tender, prompting him to come to the ED.  Patient denies any fever, chills, nausea, vomiting.  Meds: No current outpatient prescriptions on file.  Allergies: Allergies as of 01/02/2012  . (No Known Allergies)   Past Medical History  Diagnosis Date  . Diabetes mellitus without complication   . Acid reflux    No past surgical history on file. No family history on file. History   Social History  . Marital Status: Legally Separated    Spouse Name: N/A    Number of Children: N/A  . Years of Education: N/A   Occupational History  . Not on file.   Social History Main Topics  . Smoking status: Current Some Day Smoker  . Smokeless tobacco: Not on file  . Alcohol Use: Yes  . Drug Use:   . Sexually Active:    Other Topics Concern  . Not on file   Social History Narrative  . No  narrative on file    Review of Systems: Constitutional: negative for chills, fatigue, fevers and malaise Eyes: negative for visual disturbance Ears, nose, mouth, throat, and face: negative for hearing loss and sore throat Respiratory: negative for wheezing and SOB Cardiovascular: negative for chest pain, dyspnea and palpitations Gastrointestinal: negative for constipation, diarrhea, nausea and vomiting Genitourinary:positive for dark yellow urine, negative for dysuria Musculoskeletal:positive for gait disturbance, negative for bone pain, muscle weakness and myalgias Neurological: negative for headaches and weakness  Physical Exam: Blood pressure 157/87, pulse 78, temperature 98.5 F (36.9 C), temperature source Oral, resp. rate 18, SpO2 98.00%. General appearance: alert, cooperative, appears stated age, no distress and mildly obese Head: Normocephalic, without obvious abnormality, atraumatic Eyes: conjunctivae/corneas clear. PERRL, EOM's intact. Fundi benign. Lungs: clear to auscultation bilaterally Heart: regular rate and rhythm, S1, S2 normal, no murmur, click, rub or gallop Abdomen: soft, non-tender; bowel sounds normal; no masses,  no organomegaly Extremities: Left foot, 1st and 2nd toes amputation, small ulcer 5mm depth 5mm circumfrance at plantar surface just below 1st toe site.  Large superficial ~10cm ulcer on dorsal surface covering 1st and 2nd ampute sites with fluctulance and errythemia, mild TTP and distal portion. Pulses: 2+ and symmetric  Lab results: Results for Austin Mcdaniel (MRN [id number]) as of 01/02/2012 18:04  Ref. Range 01/02/2012 12:25  Sodium Latest Range: 135-145 mEq/L  128 (L)  Potassium Latest Range: 3.5-5.1 mEq/L 3.8  Chloride Latest Range: 96-112 mEq/L 89 (L)  CO2 Latest Range: 19-32 mEq/L 26  BUN Latest Range: 6-23 mg/dL 39 (H)  Creatinine Latest Range: 0.50-1.35 mg/dL 4.54 (H)  Calcium Latest Range: 8.4-10.5 mg/dL 9.4  GFR calc non Af Amer Latest Range:  >90 mL/min 41 (L)  GFR calc Af Amer Latest Range: >90 mL/min 47 (L)  Glucose Latest Range: 70-99 mg/dL 098 (H)  Alkaline Phosphatase Latest Range: 39-117 U/L 89  Albumin Latest Range: 3.5-5.2 g/dL 2.6 (L)  AST Latest Range: 0-37 U/L 8  ALT Latest Range: 0-53 U/L 8  Total Protein Latest Range: 6.0-8.3 g/dL 7.7  Total Bilirubin Latest Range: 0.3-1.2 mg/dL 0.2 (L)  WBC Latest Range: 4.0-10.5 K/uL 12.0 (H)  RBC Latest Range: 4.22-5.81 MIL/uL 3.80 (L)  Hemoglobin Latest Range: 13.0-17.0 g/dL 11.9 (L)  HCT Latest Range: 39.0-52.0 % 31.6 (L)  MCV Latest Range: 78.0-100.0 fL 83.2  MCH Latest Range: 26.0-34.0 pg 30.0  MCHC Latest Range: 30.0-36.0 g/dL 14.7 (H)  RDW Latest Range: 11.5-15.5 % 11.6  Platelets Latest Range: 150-400 K/uL 332  Neutrophils Relative Latest Range: 43-77 % 76  Lymphocytes Relative Latest Range: 12-46 % 10 (L)  Monocytes Relative Latest Range: 3-12 % 9  Eosinophils Relative Latest Range: 0-5 % 4  Basophils Relative Latest Range: 0-1 % 0  NEUT# Latest Range: 1.7-7.7 K/uL 9.1 (H)  Lymphocytes Absolute Latest Range: 0.7-4.0 K/uL 1.2  Monocytes Absolute Latest Range: 0.1-1.0 K/uL 1.1 (H)  Eosinophils Absolute Latest Range: 0.0-0.7 K/uL 0.5  Basophils Absolute Latest Range: 0.0-0.1 K/uL 0.1   Results for Austin Mcdaniel (MRN [id number]) as of 01/02/2012 18:04  Ref. Range 01/02/2012 14:20  Color, Urine Latest Range: YELLOW  YELLOW  APPearance Latest Range: CLEAR  HAZY (A)  Specific Gravity, Urine Latest Range: 1.005-1.030  1.025  pH Latest Range: 5.0-8.0  5.0  Glucose Latest Range: NEGATIVE mg/dL >8657 (A)  Bilirubin Urine Latest Range: NEGATIVE  SMALL (A)  Ketones, ur Latest Range: NEGATIVE mg/dL 15 (A)  Protein Latest Range: NEGATIVE mg/dL 846 (A)  Urobilinogen, UA Latest Range: 0.0-1.0 mg/dL 0.2  Nitrite Latest Range: NEGATIVE  NEGATIVE  Leukocytes, UA Latest Range: NEGATIVE  NEGATIVE  Hgb urine dipstick Latest Range: NEGATIVE  MODERATE (A)  Urine-Other No range found  MUCOUS PRESENT  WBC, UA Latest Range: <3 WBC/hpf 0-2  RBC / HPF Latest Range: <3 RBC/hpf 0-2  Squamous Epithelial / LPF Latest Range: RARE  RARE  Bacteria, UA Latest Range: RARE  RARE  Casts Latest Range: NEGATIVE  GRANULAR CAST (A)   Imaging results:  Dg Chest 2 View  01/02/2012  *RADIOLOGY REPORT*  Clinical Data: Fever, diabetes  CHEST - 2 VIEW  Comparison: None.  Findings: Cardiomediastinal silhouette is unremarkable.  No acute infiltrate or pleural effusion.  No pulmonary edema.  Bony thorax is unremarkable.  IMPRESSION: No active disease.   Original Report Authenticated By: Natasha Mead, M.D.    Dg Ankle Complete Left  01/02/2012  *RADIOLOGY REPORT*  Clinical Data: Ankle region soft tissue ulcer  LEFT ANKLE COMPLETE - 3+ VIEW  Comparison: None.  Findings: Frontal, lateral, and oblique views were obtained.  There is no fracture or effusion.  Ankle mortise appears intact.  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  No erosive change or bony destruction.  No soft tissue abscess seen.  IMPRESSION: Osteoarthritic change.  No fracture or joint effusion. No bony destruction.  No soft tissue abscess appreciated.  Original Report Authenticated By: Bretta Bang, M.D.    Dg Foot Complete Left  01/02/2012  *RADIOLOGY REPORT*  Clinical Data: Left foot ulcer.  LEFT FOOT - COMPLETE 3+ VIEW  Comparison: July 28, 2011.  Findings: Status post amputation of first and second toes. Chronic changes of the distal third and fourth metatarsals are noted which are unchanged compared to prior exam.  These most likely due to prior osteomyelitis. Swelling and gas is noted in the dorsal soft tissues overlying the metatarsals consistent with infection.  IMPRESSION: Findings consistent with cellulitis or ulcer involving the dorsal soft tissues.  No definite evidence of acute osteomyelitis is noted.   Original Report Authenticated By: Lupita Raider.,  M.D.     Other results: None  Assessment & Plan by  Problem: Mr. Sankey is a 41 year old male with history of uncontrolled DM with peripheral neuropathy and osteomyelitis with previous amputations.  He now presents with a new worseing ulcer on the dorsal aspect of his left foot.  1. Infected neuropathic left foot ulcer- Large erythematous flocculent ulcer over left dorsal foot.  Patient with previous history of osteomyelitis with amputations for 1st and 2nd left toes.  Blood glucose on presentation is 423.  Left foot Xrays consistent with cellulitis without osteomyelitis.  Pt currently is afebrile and denies systemic symptoms.  Probe did not reach bone. Pt currently has leukocytosis of 12.  Obtained blood cultures, superficial wound culture.  Ordered left foot MRI.  Consulted orthopaedic surgery.  Will order bedrest, elevation, non-weight-bearing status, wound care.  Will start Vancomycin and Zosyn. Vicodin prn for pain control  2. Leukocytosis of 12. Pt afebrile.  No constitution complaints.  Most likely secondary to left foot ulcer. Will start Vancomycin and Zosyn.  3. Acute Kidney Injury:  Baseline Cr ~1 in 2012.  On admission Cr 1.96, BUN 39.  Granular casts on UA micro.  Will obtain FEna and Uosmo.  Will give IVF NS at 148ml/hr.  4. Hyponatremia: Na 128 on admission, will order urine osmolarity and fractional excretion of sodium.  5. Uncontrolled DM: Patient was treated with insulin over 1 year ago, currently with blood glucose of 423 and glucosuria.  Will obtain HbA1c.  Will start Novolog 6 units tid, and Novolog sliding scale.  6. Anemia:  Patient with Hgb of 11.4.  On previous admission Hgb was in 10-12 range  7. HTN: pt currently with BP in 150s/80s range, takes no home meds.  Will continue to monitor BP.  8. HLD: pt with history of high cholesterol on previous admission, will obtain lipid profile.  DVT PPx: Lovenox Code status: Full    This is a Psychologist, occupational Note.  The care of the patient was discussed with Dr. Bosie Clos and the  assessment and plan was formulated with their assistance.  Please see their note for official documentation of the patient encounter.   Signed: Gust Rung 01/02/2012, 6:01 PM

## 2012-01-03 ENCOUNTER — Encounter (HOSPITAL_COMMUNITY): Payer: Self-pay | Admitting: General Practice

## 2012-01-03 DIAGNOSIS — F411 Generalized anxiety disorder: Secondary | ICD-10-CM

## 2012-01-03 DIAGNOSIS — L97509 Non-pressure chronic ulcer of other part of unspecified foot with unspecified severity: Secondary | ICD-10-CM

## 2012-01-03 LAB — CBC
MCV: 83.2 fL (ref 78.0–100.0)
Platelets: 328 10*3/uL (ref 150–400)
RDW: 11.7 % (ref 11.5–15.5)
WBC: 8.1 10*3/uL (ref 4.0–10.5)

## 2012-01-03 LAB — BASIC METABOLIC PANEL
Calcium: 9.2 mg/dL (ref 8.4–10.5)
Creatinine, Ser: 1.99 mg/dL — ABNORMAL HIGH (ref 0.50–1.35)
GFR calc Af Amer: 46 mL/min — ABNORMAL LOW (ref >90)
GFR calc non Af Amer: 40 mL/min — ABNORMAL LOW (ref >90)
Sodium: 130 mEq/L — ABNORMAL LOW (ref 135–145)

## 2012-01-03 LAB — SODIUM, URINE, RANDOM: Sodium, Ur: 23 mEq/L

## 2012-01-03 LAB — GLUCOSE, CAPILLARY
Glucose-Capillary: 345 mg/dL — ABNORMAL HIGH (ref 70–99)
Glucose-Capillary: 247 mg/dL — ABNORMAL HIGH (ref 70–99)

## 2012-01-03 LAB — HEMOGLOBIN A1C: Mean Plasma Glucose: 369 mg/dL — ABNORMAL HIGH (ref <117)

## 2012-01-03 LAB — CREATININE, URINE, RANDOM: Creatinine, Urine: 104.1 mg/dL

## 2012-01-03 MED ORDER — LISINOPRIL 10 MG PO TABS
10.0000 mg | ORAL_TABLET | Freq: Every day | ORAL | Status: DC
  Administered 2012-01-03 – 2012-01-05 (×3): 10 mg via ORAL
  Filled 2012-01-03 (×5): qty 1

## 2012-01-03 MED ORDER — INFLUENZA VIRUS VACC SPLIT PF IM SUSP
0.5000 mL | Freq: Once | INTRAMUSCULAR | Status: AC
  Administered 2012-01-03: 0.5 mL via INTRAMUSCULAR
  Filled 2012-01-03: qty 0.5

## 2012-01-03 MED ORDER — PNEUMOCOCCAL VAC POLYVALENT 25 MCG/0.5ML IJ INJ
0.5000 mL | INJECTION | Freq: Once | INTRAMUSCULAR | Status: AC
  Administered 2012-01-03: 0.5 mL via INTRAMUSCULAR
  Filled 2012-01-03: qty 0.5

## 2012-01-03 MED ORDER — INSULIN GLARGINE 100 UNIT/ML ~~LOC~~ SOLN
10.0000 [IU] | Freq: Every day | SUBCUTANEOUS | Status: DC
  Administered 2012-01-03 – 2012-01-05 (×3): 10 [IU] via SUBCUTANEOUS

## 2012-01-03 NOTE — Progress Notes (Signed)
Subjective: Feels better   Objective: Vital signs in last 24 hours: Temp:  [97.9 F (36.6 C)-98.5 F (36.9 C)] 98.3 F (36.8 C) (11/09 0621) Pulse Rate:  [72-82] 75  (11/09 0621) Resp:  [18-20] 18  (11/09 0621) BP: (139-157)/(83-100) 154/88 mmHg (11/09 0621) SpO2:  [97 %-100 %] 100 % (11/09 0621) Weight:  [102.059 kg (225 lb)] 102.059 kg (225 lb) (11/08 1700)  Intake/Output from previous day: 11/08 0701 - 11/09 0700 In: 1500 [I.V.:1250; IV Piggyback:250] Out: -  Intake/Output this shift:     Basename 01/03/12 0650 01/02/12 1737 01/02/12 1225  HGB 11.1* 11.3* 11.4*    Basename 01/03/12 0650 01/02/12 1737  WBC 8.1 10.0  RBC 3.75* 3.79*  HCT 31.2* 31.6*  PLT 328 296    Basename 01/02/12 1737 01/02/12 1225  NA -- 128*  K -- 3.8  CL -- 89*  CO2 -- 26  BUN -- 39*  CREATININE 1.90* 1.96*  GLUCOSE -- 423*  CALCIUM -- 9.4   No results found for this basename: LABPT:2,INR:2 in the last 72 hours  Incision: moderate drainage No proximal cellulitis  Plan: Infected diabetic foot ulcer improved on IV Ab. Dr. Victorino Dike to see tomorrow. Dressing changes. NPO Sunday   Austin Mcdaniel C 01/03/2012, 8:16 AM

## 2012-01-03 NOTE — H&P (Signed)
Internal Medicine Attending Admission Note Date: 01/03/2012  Patient name: Austin Mcdaniel Medical record number: [id number] Date of birth: Apr 11, 1970 Age: 41 y.o. Gender: male  I saw and evaluated the patient. I reviewed the resident's note and I agree with the resident's findings and plan as documented in the resident's note, with the following additional comments.  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  He reports that he has not taken insulin or other medications for the past year because of difficulty affording them.   Physical Exam - key components related to admission:  Filed Vitals:   01/02/12 1645 01/02/12 1700 01/02/12 2207 01/03/12 0621  BP: 157/87 155/83 154/83 154/88  Pulse: 78 72 79 75  Temp:  97.9 F (36.6 C) 98.2 F (36.8 C) 98.3 F (36.8 C)  TempSrc:   Oral   Resp:  18 19 18   Height:  6\' 1"  (1.854 m)    Weight:  225 lb (102.059 kg)    SpO2: 98% 99% 97% 100%   General: Alert, no distress Lungs: Clear Heart: Regular; S1-S2, no S3, no S4, no murmurs Abdomen: Bowel sounds present, soft, nontender Extremities: Left foot is swollen and erythematous, with an ulcer on the dorsal aspect and foul smelling purulent drainage   Lab results:   Basic Metabolic Panel:  Basename 01/03/12 0650 01/02/12 1737 01/02/12 1225  NA 130* -- 128*  K 3.8 -- 3.8  CL 90* -- 89*  CO2 25 -- 26  GLUCOSE 367* -- 423*  BUN 44* -- 39*  CREATININE 1.99* 1.90* --  CALCIUM 9.2 -- 9.4  MG -- -- --  PHOS -- -- --   Liver Function  Tests:  Basename 01/02/12 1225  AST 8  ALT 8  ALKPHOS 89  BILITOT 0.2*  PROT 7.7  ALBUMIN 2.6*    CBC:  Basename 01/03/12 0650 01/02/12 1737 01/02/12 1225  WBC 8.1 10.0 --  NEUTROABS -- -- 9.1*  HGB 11.1* 11.3* --  HCT 31.2* 31.6* --  MCV 83.2 83.4 --  PLT 328 296 --    CBG:  Basename 01/03/12 0722 01/02/12 2303 01/02/12 1701 01/02/12 1116  GLUCAP 327* 342* 389* 406*    Fasting Lipid Panel:  Basename 01/02/12 1951  CHOL 198  HDL 10*  LDLCALC 132*  TRIG 280*  CHOLHDL 19.8  LDLDIRECT --   Urinalysis    Component Value Date/Time   COLORURINE YELLOW 01/02/2012 1420   APPEARANCEUR HAZY* 01/02/2012 1420   LABSPEC 1.025 01/02/2012 1420   PHURINE 5.0 01/02/2012 1420   GLUCOSEU >1000* 01/02/2012 1420   HGBUR MODERATE* 01/02/2012 1420   BILIRUBINUR SMALL* 01/02/2012 1420   KETONESUR 15* 01/02/2012 1420   PROTEINUR 100* 01/02/2012 1420   UROBILINOGEN 0.2 01/02/2012 1420   NITRITE NEGATIVE 01/02/2012 1420   LEUKOCYTESUR NEGATIVE 01/02/2012 1420    Urine microscopic: WBCs 0-2, RBC 0-2, squamous epithelial rare, bacteria rare, granular casts   Imaging results:  Dg Chest 2 View  01/02/2012  *RADIOLOGY REPORT*  Clinical Data: Fever, diabetes  CHEST - 2 VIEW  Comparison: None.  Findings: Cardiomediastinal silhouette is unremarkable.  No acute  infiltrate or pleural effusion.  No pulmonary edema.  Bony thorax is unremarkable.  IMPRESSION: No active disease.   Original Report Authenticated By: Natasha Mead, M.D.    Dg Ankle Complete Left  01/02/2012  *RADIOLOGY REPORT*  Clinical Data: Ankle region soft tissue ulcer  LEFT ANKLE COMPLETE - 3+ VIEW  Comparison: None.  Findings: Frontal, lateral, and oblique views were obtained.  There is no fracture or effusion.  Ankle mortise appears intact.  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  No erosive change or bony destruction.  No soft tissue abscess seen.  IMPRESSION: Osteoarthritic change.  No fracture or joint effusion. No  bony destruction.  No soft tissue abscess appreciated.   Original Report Authenticated By: Bretta Bang, M.D.    Mr Foot Left W Wo Contrast  01/02/2012  *RADIOLOGY REPORT*  Clinical Data: Blistering bottom of foot.  Prior amputations. Diabetes.  MRI OF THE LEFT FOREFOOT WITHOUT AND WITH CONTRAST  Technique:  Multiplanar, multisequence MR imaging was performed both before and after administration of intravenous contrast.  Contrast: 20mL MULTIHANCE GADOBENATE DIMEGLUMINE 529 MG/ML IV SOLN  Comparison: 01/02/2012 radiographs  Findings: Prior amputations of the first and second toes at the MTP joints observed.  Extensive irregular abscess noted dorsal and distal to the first and second metatarsals, with abnormal but low-level edema in the second metatarsal favoring chronic osteomyelitis, and deformity the head of the second metatarsal.  There is deformity, irregularity, and erosion of the heads of the third and fourth metatarsals which could be from the erosive arthropathy or fracture, but with osteomyelitis also considered a possibility.  However, there is little in the way of edema in the adjacent third and fourth metatarsal shafts, and no phalangeal osteomyelitis is observed.  As expected, there is extensive surrounding infiltrative subcutaneous edema and enhancement particularly around the abscesses but also tracking in the dorsum of the foot.  Gas tracks within the dorsal abscess and within the distal portions of the abscess, and I suspect draining sinus tracts along the amputation bed of the first and second toes.  On image 29 of series 10, draining sinus tract to the medial ball of the foot is also suggested.  Lisfranc ligament is intact.  Visualized midfoot structures intact.  Edema and enhancement tracks along the plantar musculature of the foot.  IMPRESSION:  1.  Considerable geographic abscess dorsal to the first and second metatarsals, tracking in the thickened and enhancing soft tissues, and also  extending distal to the head of the first and second metatarsals.  There is probably some drainage along the amputation bed, and there is considerable gas within the abscess. 2.  Low-level edema and enhancement in the second metatarsal, favoring low-level osteomyelitis. 3.  Deformities and erosions of the heads of the third and fourth metatarsals, chronic since June 2013, potentially related to osteomyelitis, the erosive arthropathy, or prior fractures. However, there is not currently significant edema tracks in the shaft of the third and fourth metatarsals.  4.  Extensive regional cellulitis, especially dorsally. 5.  Edema and enhancement tracking along the plantar musculature of the foot, compatible with myositis/fasciitis   Original Report Authenticated By: Gaylyn Rong, M.D.    Dg Foot Complete Left  01/02/2012  *RADIOLOGY REPORT*  Clinical Data: Left foot ulcer.  LEFT FOOT - COMPLETE 3+ VIEW  Comparison: July 28, 2011.  Findings: Status post amputation of first and second toes. Chronic changes of the distal third and fourth metatarsals are noted which are unchanged compared  to prior exam.  These most likely due to prior osteomyelitis. Swelling and gas is noted in the dorsal soft tissues overlying the metatarsals consistent with infection.  IMPRESSION: Findings consistent with cellulitis or ulcer involving the dorsal soft tissues.  No definite evidence of acute osteomyelitis is noted.   Original Report Authenticated By: Lupita Raider.,  M.D.    Assessment & Plan by Problem:  1.  Diabetic foot ulcer with associated foot infection and possible osteomyelitis.  Plan is empiric broad-spectrum antibiotic therapy with vancomycin and Zosyn; orthopedic surgery has been consulted and is following the patient; he will need surgical debridement as per orthopedic surgery.  2.  Type 2 diabetes mellitus.  This is poorly controlled due to patient not having taken his medications, which he reports is due to cost.   Plan is to adjust insulin regimen as inpatient; diabetes education; case management consultation for assistance with medications once he is discharged home.  3.  Renal insufficiency.  Patient's FENa is consistent with a prerenal etiology, possibly due to osmotic diuresis from persistent hyperglycemia.  Plan is IV normal saline volume replacement, follow renal function and urine output closely; further evaluation if his renal function does not normalize with volume replacement.  4.  Other problems as per resident physician's note.

## 2012-01-03 NOTE — H&P (Signed)
I have reviewed the note by Harmon Dun MS 4 and was present during the interview and physical exam.  Please see separate note for findings, assessment, and plan.  Resident Addendum to Medical Student Note   I have seen and examined the patient, and agree with the the medical student assessment and plan outlined above. Please see the separate H&P for additional details.    Length of Stay: 1   Kristie Cowman, MD PGY2, Internal Medicine Resident 01/03/2012, 3:28 PM

## 2012-01-03 NOTE — Progress Notes (Signed)
VASCULAR LAB PRELIMINARY  ARTERIAL  ABI completed:    RIGHT    LEFT    PRESSURE WAVEFORM  PRESSURE WAVEFORM  BRACHIAL 179 T BRACHIAL  T  DP   DP    AT 220 T AT 191 T  PT 223 T PT 231 T  PER   PER    GREAT TOE  NA GREAT TOE  NA    RIGHT LEFT  ABI >1.0 >1.0     Austin Mcdaniel, 01/03/2012, 8:02 AM

## 2012-01-03 NOTE — Progress Notes (Signed)
Medical Student Daily Progress Note   Subjective:    Interval Events:  Patient doing well, no complaints.  Seen at bedside, patient about to eat breakfast.  Denies fever, chills.  Admits slight HA, attributes to not having eaten yet.    Objective:    Vital Signs:   Temp:  [97.9 F (36.6 C)-98.5 F (36.9 C)] 98.3 F (36.8 C) (11/09 1610) Pulse Rate:  [72-82] 75  (11/09 0621) Resp:  [18-20] 18  (11/09 0621) BP: (139-157)/(83-100) 154/88 mmHg (11/09 0621) SpO2:  [97 %-100 %] 100 % (11/09 0621) Weight:  [102.059 kg (225 lb)] 102.059 kg (225 lb) (11/08 1700) Last BM Date: 01/02/12   Intake/Output:   Intake/Output Summary (Last 24 hours) at 01/03/12 1030 Last data filed at 01/03/12 0700  Gross per 24 hour  Intake   1500 ml  Output      0 ml  Net   1500 ml     Net since admission:    Physical Exam: GENERAL:  alert and oriented; resting comfortably in bed and in no distress LUNGS:  clear to auscultation bilaterally, normal work of breathing HEART:  normal rate; regular rhythm; normal S1 and S2, no S3 or S4 appreciated; no murmurs, rubs, or clicks ABDOMEN:  soft, non-tender, normal bowel sounds, no masses palpated EXTREMITIES: Left foot with erythema TTP, 4-5 circumferential eschar on dorsum of foot. Fluctuance in tissue around eschar with large blister proximal to left 5th toe. Fluctuance at stump of left foot. 1-2cm plantar ulcer the the 1st toe region. Pus discharge at 2nd toe amputation site. SKIN:  normal turgor    Labs: Basic Metabolic Panel:  Lab 01/03/12 9604 01/02/12 1737 01/02/12 1225  NA 130* -- 128*  K 3.8 -- 3.8  CL 90* -- 89*  CO2 25 -- 26  GLUCOSE 367* -- 423*  BUN 44* -- 39*  CREATININE 1.99* 1.90* 1.96*  CALCIUM 9.2 -- 9.4  MG -- -- --  PHOS -- -- --    Liver Function Tests:  Lab 01/02/12 1225  AST 8  ALT 8  ALKPHOS 89  BILITOT 0.2*  PROT 7.7  ALBUMIN 2.6*   No results found for this basename: LIPASE:5,AMYLASE:5 in the last 168  hours No results found for this basename: AMMONIA:3 in the last 168 hours  CBC:  Lab 01/03/12 0650 01/02/12 1737 01/02/12 1225  WBC 8.1 10.0 12.0*  NEUTROABS -- -- 9.1*  HGB 11.1* 11.3* 11.4*  HCT 31.2* 31.6* 31.6*  MCV 83.2 83.4 83.2  PLT 328 296 332    CBG:  Lab 01/03/12 0722 01/02/12 2303 01/02/12 1701 01/02/12 1116  GLUCAP 327* 342* 389* 406*   Microbiology: Results for orders placed during the hospital encounter of 01/02/12  WOUND CULTURE     Status: Normal (Preliminary result)   Collection Time   01/02/12  4:31 PM      Component Value Range Status Comment   Specimen Description WOUND FOOT LEFT   Final    Special Requests Normal   Final    Gram Stain     Final    Value: NO WBC SEEN     ABUNDANT SQUAMOUS EPITHELIAL CELLS PRESENT     ABUNDANT GRAM POSITIVE COCCI     IN PAIRS IN CLUSTERS MODERATE GRAM POSITIVE RODS     FEW GRAM NEGATIVE RODS   Culture Culture reincubated for better growth   Final    Report Status PENDING   Incomplete     Imaging: Dg Chest  2 View  01/02/2012  *RADIOLOGY REPORT*  Clinical Data: Fever, diabetes  CHEST - 2 VIEW  Comparison: None.  Findings: Cardiomediastinal silhouette is unremarkable.  No acute infiltrate or pleural effusion.  No pulmonary edema.  Bony thorax is unremarkable.  IMPRESSION: No active disease.   Original Report Authenticated By: Natasha Mead, M.D.    Dg Ankle Complete Left  01/02/2012  *RADIOLOGY REPORT*  Clinical Data: Ankle region soft tissue ulcer  LEFT ANKLE COMPLETE - 3+ VIEW  Comparison: None.  Findings: Frontal, lateral, and oblique views were obtained.  There is no fracture or effusion.  Ankle mortise appears intact.  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  No erosive change or bony destruction.  No soft tissue abscess seen.  IMPRESSION: Osteoarthritic change.  No fracture or joint effusion. No bony destruction.  No soft tissue abscess appreciated.   Original Report Authenticated By: Bretta Bang,  M.D.    Mr Foot Left W Wo Contrast  01/02/2012  *RADIOLOGY REPORT*  Clinical Data: Blistering bottom of foot.  Prior amputations. Diabetes.  MRI OF THE LEFT FOREFOOT WITHOUT AND WITH CONTRAST  Technique:  Multiplanar, multisequence MR imaging was performed both before and after administration of intravenous contrast.  Contrast: 20mL MULTIHANCE GADOBENATE DIMEGLUMINE 529 MG/ML IV SOLN  Comparison: 01/02/2012 radiographs  Findings: Prior amputations of the first and second toes at the MTP joints observed.  Extensive irregular abscess noted dorsal and distal to the first and second metatarsals, with abnormal but low-level edema in the second metatarsal favoring chronic osteomyelitis, and deformity the head of the second metatarsal.  There is deformity, irregularity, and erosion of the heads of the third and fourth metatarsals which could be from the erosive arthropathy or fracture, but with osteomyelitis also considered a possibility.  However, there is little in the way of edema in the adjacent third and fourth metatarsal shafts, and no phalangeal osteomyelitis is observed.  As expected, there is extensive surrounding infiltrative subcutaneous edema and enhancement particularly around the abscesses but also tracking in the dorsum of the foot.  Gas tracks within the dorsal abscess and within the distal portions of the abscess, and I suspect draining sinus tracts along the amputation bed of the first and second toes.  On image 29 of series 10, draining sinus tract to the medial ball of the foot is also suggested.  Lisfranc ligament is intact.  Visualized midfoot structures intact.  Edema and enhancement tracks along the plantar musculature of the foot.  IMPRESSION:  1.  Considerable geographic abscess dorsal to the first and second metatarsals, tracking in the thickened and enhancing soft tissues, and also extending distal to the head of the first and second metatarsals.  There is probably some drainage along the  amputation bed, and there is considerable gas within the abscess. 2.  Low-level edema and enhancement in the second metatarsal, favoring low-level osteomyelitis. 3.  Deformities and erosions of the heads of the third and fourth metatarsals, chronic since June 2013, potentially related to osteomyelitis, the erosive arthropathy, or prior fractures. However, there is not currently significant edema tracks in the shaft of the third and fourth metatarsals.  4.  Extensive regional cellulitis, especially dorsally. 5.  Edema and enhancement tracking along the plantar musculature of the foot, compatible with myositis/fasciitis   Original Report Authenticated By: Gaylyn Rong, M.D.    Dg Foot Complete Left  01/02/2012  *RADIOLOGY REPORT*  Clinical Data: Left foot ulcer.  LEFT FOOT - COMPLETE 3+ VIEW  Comparison: July 28, 2011.  Findings: Status post amputation of first and second toes. Chronic changes of the distal third and fourth metatarsals are noted which are unchanged compared to prior exam.  These most likely due to prior osteomyelitis. Swelling and gas is noted in the dorsal soft tissues overlying the metatarsals consistent with infection.  IMPRESSION: Findings consistent with cellulitis or ulcer involving the dorsal soft tissues.  No definite evidence of acute osteomyelitis is noted.   Original Report Authenticated By: Lupita Raider.,  M.D.       Medications:    Infusions: NS @ 181ml/hr   Scheduled Medications:    . enoxaparin (LOVENOX) injection  40 mg Subcutaneous Q24H  . influenza  inactive virus vaccine  0.5 mL Intramuscular Once  . insulin aspart  0-20 Units Subcutaneous TID WC  . insulin aspart  6 Units Subcutaneous TID WC  . insulin glargine  10 Units Subcutaneous Daily  . lisinopril  10 mg Oral Daily  . ondansetron (ZOFRAN) IV  4 mg Intravenous Once  . [COMPLETED] piperacillin-tazobactam (ZOSYN)  IV  3.375 g Intravenous Once  . piperacillin-tazobactam (ZOSYN)  IV  3.375 g  Intravenous Q8H  . pneumococcal 23 valent vaccine  0.5 mL Intramuscular Once  . sodium chloride  1,000 mL Intravenous Once  . vancomycin  1,000 mg Intravenous Once  . vancomycin  1,000 mg Intravenous Q12H  . [COMPLETED] vancomycin  1,000 mg Intravenous NOW  . [DISCONTINUED] vancomycin  1,000 mg Intravenous NOW     PRN Medications: acetaminophen, acetaminophen, [COMPLETED] gadobenate dimeglumine, HYDROcodone-acetaminophen    Assessment/ Plan:    Mr. Cory is a 41 year old male with history of uncontrolled DM with peripheral neuropathy and osteomyelitis with previous amputations. He now presents with a new worseing ulcer on the dorsal aspect of his left foot.  1. Infected neuropathic left foot ulcer- Large erythematous flocculent ulcer over left dorsal foot. Patient with previous history of osteomyelitis with amputations for 1st and 2nd left toes. Blood glucose on presentation is 423. Left foot Xrays consistent with cellulitis without osteomyelitis. MRI suggests possible low level osteomylitis of 2nd distal metatarsal, also gas and abscess present.  Pt currently is afebrile and denies systemic symptoms. Probe did not reach bone. Pt's leukocytosis has resolved. Superficial wound gram stain reveals G+ Rods and G+ Cocci.  Blood cultures Pending.  Orthopaedic surgery following, Dr. Victorino Dike will see tomorrow for probable surgery. ABI obtained which was normal.  Will continue bedrest, elevation, non-weight-bearing status, wound care. Continue Vancomycin and Zosyn. Vicodin prn for pain control   2.. Acute Kidney Injury: Baseline Cr ~1 in 2012. On admission Cr 1.96, BUN 39. FEna 0.34%.  Uosmo- Pending.  Most likely prerenal. Patient on IVF NS @ 164ml/hr.  3. Hyponatremia: Na 128 on admission, 130 today.  FEna 0.34% Uosmo pending.  Will continue IVF NS @ 146ml/hr.   4. Uncontrolled DM:  Blood glucose 367 today.  HbA1c 15.2 in 2012.  Will obtain HbA1c.  Contine Novolog 6 units tid, and Novolog sliding  scale.  Start Lantus 10 units  Daily.  5. Anemia:  Hgb of 11.1. Asymptomatic.  History of Anemia.  Will continue to monitor.  6. HTN: pt currently with BP in 150s/80s range, takes no home meds. Start lisinopril 10mg  daily.  7. HLD: Triglycerides 280, HDL 10, LDL 132. Will start cholesterol med on discharge.  8.Leukocytosis resolved  DVT PPx- Lovenox Code Status- Full      Length of Stay: 1 days  This is a Psychologist, occupational Note.  The care of the patient was discussed with Dr. Bosie Clos and the assessment and plan formulated with their assistance.  Please see their attached note or addendum for official documentation of the daily encounter.

## 2012-01-03 NOTE — Progress Notes (Signed)
Resident Addendum to Medical Student Note   I have seen and examined the patient, and agree with the the medical student assessment and plan outlined above. Please see my brief note below for additional details.  S: No acute events overnight   OBJECTIVE: VS: Reviewed  Meds: Reviewed  Labs: Reviewed  Imaging: Reviewed   Physical Exam: General: Vital signs reviewed and noted. Well-developed, well-nourished, in no acute distress; alert, appropriate and cooperative throughout examination.  Lungs:  Normal respiratory effort. Clear to auscultation BL without crackles or wheezes.  Heart: RRR. S1 and S2 normal without gallop, murmur, or rubs appreciated  Extremities: erythema to dorsum of left foot, TTP, plantar eschar. Prior amp site with fluctuant ulcer and pus     ASSESSMENT/ PLAN: Pt is a 41 y.o. yo male with a PMHx of poorly controlled diabetes and diabetic foot ulcers s/p prior toe amputations who was admitted on 01/02/2012 with symptoms of left foot pain, swelling, and drainage.  1. Diabetic Foot ulcer: MRI w/ evidence of osteomyelitis, wound culture with gm pos cocci and moderate gm pos rods, on Vanc and Zosyn, remains afebrile with resolved leukocytosis, Ortho following planning for OR Mon per Dr. Victorino Dike -cont broad spectrum Antibx -cont Wound Care -cont trend leukocytosis -blood cx pending   Lab 01/03/12 0650 01/02/12 1737 01/02/12 1225  WBC 8.1 10.0 12.0*   2. Diabetes Mellitus Type 2: poorly controlled secondary to noncompliance with meds (financial) -DM Educator -cont SSI resistant -started Lantus 10 units qd today  CBG (last 3)   Basename 01/03/12 1139 01/03/12 0722 01/02/12 2303  GLUCAP 345* 327* 342*    3. Acute Kidney Injury: likely secondary to dehydration in setting of persistent hyperglycemia, FENa <1% -cont NS IV fluids -monitor creatinine   Lab 01/03/12 0650 01/02/12 1737 01/02/12 1225  CREATININE 1.99* 1.90* 1.96*   4. Hypertension: above goal, pt  discharged last year on lisinopril 10 mg qd but noncompliant due to financial concerns -resume lisinopril 10 mg qd   5. Disposition: consult case manager for financial assistance with medications  Length of Stay: 1   Kristie Cowman, MD PGY2, Internal Medicine Resident 01/03/2012, 12:42 PM

## 2012-01-04 LAB — BASIC METABOLIC PANEL
Chloride: 95 mEq/L — ABNORMAL LOW (ref 96–112)
GFR calc Af Amer: 45 mL/min — ABNORMAL LOW (ref >90)
GFR calc non Af Amer: 39 mL/min — ABNORMAL LOW (ref >90)
Potassium: 3.7 mEq/L (ref 3.5–5.1)
Sodium: 132 mEq/L — ABNORMAL LOW (ref 135–145)

## 2012-01-04 LAB — CBC
HCT: 28.6 % — ABNORMAL LOW (ref 39.0–52.0)
MCHC: 34.3 g/dL (ref 30.0–36.0)
RDW: 11.9 % (ref 11.5–15.5)
WBC: 8.4 10*3/uL (ref 4.0–10.5)

## 2012-01-04 LAB — GLUCOSE, CAPILLARY
Glucose-Capillary: 223 mg/dL — ABNORMAL HIGH (ref 70–99)
Glucose-Capillary: 250 mg/dL — ABNORMAL HIGH (ref 70–99)
Glucose-Capillary: 259 mg/dL — ABNORMAL HIGH (ref 70–99)
Glucose-Capillary: 210 mg/dL — ABNORMAL HIGH (ref 70–99)

## 2012-01-04 LAB — WOUND CULTURE

## 2012-01-04 NOTE — Progress Notes (Signed)
Resident Addendum to Medical Student Note   I have seen and examined the patient, and agree with the the medical student assessment and plan outlined above. Please see my brief note below for additional details.  S: No acute events overnight   OBJECTIVE: VS: Reviewed  Meds: Reviewed  Labs: Reviewed  Imaging: Reviewed   Physical Exam: General: Vital signs reviewed and noted. Well-developed, well-nourished, in no acute distress; alert, appropriate and cooperative throughout examination.  Lungs:  Normal respiratory effort. Clear to auscultation BL without crackles or wheezes.  Heart: RRR. S1 and S2 normal without gallop, murmur, or rubs appreciated  Extremities: erythema to dorsum of left foot, TTP, plantar eschar. Prior amp site with fluctuant ulcer and pus     ASSESSMENT/ PLAN: Pt is a 41 y.o. yo male with a PMHx of poorly controlled diabetes and diabetic foot ulcers s/p prior toe amputations who was admitted on 01/02/2012 with symptoms of left foot pain, swelling, and drainage.  1. Diabetic Foot ulcer: MRI w/ evidence of osteomyelitis, wound culture with gm pos cocci and moderate gm pos rods, on Vanc and Zosyn, remains afebrile with resolved leukocytosis, Ortho following planning for OR Mon per Dr. Victorino Dike -cont broad spectrum Antibx -cont Wound Care -cont trend leukocytosis -blood cx pending   Lab 01/04/12 0617 01/03/12 0650 01/02/12 1737 01/02/12 1225  WBC 8.4 8.1 10.0 12.0*   2. Diabetes Mellitus Type 2: poorly controlled secondary to noncompliance with meds (financial), improvement in cbg on basal insulin and SSI with meal coverage -DM Educator -cont SSI resistant -cont Lantus 10 units qd with likely increase once no longer NPO  CBG (last 3)   Basename 01/04/12 0645 01/03/12 2211 01/03/12 1608  GLUCAP 250* 223* 247*    3. Acute Kidney Injury: FENa <1%, NPO overnight with continued hyperglycemia -increase NS IV fluids to 150cc/h -cont to monitor creatinine   Lab  01/04/12 0617 01/03/12 0650 01/02/12 1737 01/02/12 1225  CREATININE 2.03* 1.99* 1.90* 1.96*   4. Hypertension: above goal, pt discharged last year on lisinopril 10 mg qd but noncompliant due to financial concerns -cont lisinopril 10 mg qd   5. Disposition: case manager consulted for financial assistance with medications  Length of Stay: 2   Kristie Cowman, MD PGY2, Internal Medicine Resident 01/04/2012, 10:01 AM

## 2012-01-04 NOTE — Progress Notes (Signed)
edical Student Daily Progress Note   Subjective:    Interval Events:  No acute events overnight. Ortho Dr. Victorino Dike is scheduled to evaluate the patient today for surgery I&D vs. Amputation. He is NPO. CBG 250 this AM. SBP 144-154. The patient got first dose of 10 mg lisinopril yesterday night. The patient remains afebrile. Pain managed with Tylenol.  Subjectively the patient reports intermittent throbbing pain in his left foot. He denies chills, abdominal pain, N/V/D, CP, dyspnea, cough, dysuria, and headache.    Objective:    Vital Signs:   Temp:  [98.1 F (36.7 C)-98.3 F (36.8 C)] 98.2 F (36.8 C) (11/10 0615) Pulse Rate:  [69-71] 71  (11/10 0615) Resp:  [18] 18  (11/10 0615) BP: (123-153)/(78-86) 153/81 mmHg (11/10 0615) SpO2:  [97 %-99 %] 99 % (11/10 0615) Last BM Date: 01/03/12   Weights: 24-hour Weight change:   Filed Weights   01/02/12 1700  Weight: 225 lb (102.059 kg)   Net since admission:     Intake/Output:   Intake/Output Summary (Last 24 hours) at 01/04/12 0758 Last data filed at 01/04/12 0700  Gross per 24 hour  Intake   1480 ml  Output   1000 ml  Net    480 ml     24 hour UOP: 7.4 mL/kg/hr   Physical Exam: GENERAL:  resting comfortably in bed and in no distress HEENT:  pupils equal, round, and reactive to light; sclera anicteric; moist mucosa LUNGS:  clear to auscultation bilaterally with no wheezes, rhonchi or rales, normal work of breathing HEART:  normal rate; regular rhythm; normal S1 and S2, no S3 or S4 appreciated; no murmurs, rubs, or gallops ABDOMEN:  +BS, soft, non-tender, no masses or organomegaly appreciated EXTREMITIES:   No cyanosis or clubbing. No erythema or edema in Right LE. 1+ DP bilaterally. Erythema and mild edema of dorsum of left foot, TTP, plantar eschar. Prior amputation site with fluctuant ulcer active malodorous, puruluent draining.    Labs: Basic Metabolic Panel:  Lab 01/03/12 8119 01/02/12 1737 01/02/12 1225  NA 130*  -- 128*  K 3.8 -- 3.8  CL 90* -- 89*  CO2 25 -- 26  GLUCOSE 367* -- 423*  BUN 44* -- 39*  CREATININE 1.99* 1.90* 1.96*  CALCIUM 9.2 -- 9.4  MG -- -- --  PHOS -- -- --    Liver Function Tests:  Lab 01/02/12 1225  AST 8  ALT 8  ALKPHOS 89  BILITOT 0.2*  PROT 7.7  ALBUMIN 2.6*   No results found for this basename: LIPASE:5,AMYLASE:5 in the last 168 hours No results found for this basename: AMMONIA:3 in the last 168 hours  CBC:  Lab 01/03/12 0650 01/02/12 1737 01/02/12 1225  WBC 8.1 10.0 12.0*  NEUTROABS -- -- 9.1*  HGB 11.1* 11.3* 11.4*  HCT 31.2* 31.6* 31.6*  MCV 83.2 83.4 83.2  PLT 328 296 332    Cardiac Enzymes: No results found for this basename: CKTOTAL:5,CKMB:5,CKMBINDEX:5,TROPONINI:5 in the last 168 hours  BNP (last 3 results): No results found for this basename: PROBNP:3 in the last 8760 hours  CBG:  Lab 01/04/12 0645 01/03/12 2211 01/03/12 1608 01/03/12 1139 01/03/12 0722  GLUCAP 250* 223* 247* 345* 327*    Coagulation Studies: No results found for this basename: LABPROT:5,INR:5 in the last 72 hours  Microbiology: Results for orders placed during the hospital encounter of 01/02/12  CULTURE, BLOOD (ROUTINE X 2)     Status: Normal (Preliminary result)   Collection Time  01/02/12 12:33 PM      Component Value Range Status Comment   Specimen Description BLOOD RIGHT ANTECUBITAL   Final    Special Requests BOTTLES DRAWN AEROBIC AND ANAEROBIC 10CC   Final    Culture  Setup Time 01/02/2012 18:57   Final    Culture     Final    Value:        BLOOD CULTURE RECEIVED NO GROWTH TO DATE CULTURE WILL BE HELD FOR 5 DAYS BEFORE ISSUING A FINAL NEGATIVE REPORT   Report Status PENDING   Incomplete   CULTURE, BLOOD (ROUTINE X 2)     Status: Normal (Preliminary result)   Collection Time   01/02/12 12:38 PM      Component Value Range Status Comment   Specimen Description BLOOD LEFT ANTECUBITAL   Final    Special Requests BOTTLES DRAWN AEROBIC AND ANAEROBIC 10CC    Final    Culture  Setup Time 01/02/2012 18:57   Final    Culture     Final    Value:        BLOOD CULTURE RECEIVED NO GROWTH TO DATE CULTURE WILL BE HELD FOR 5 DAYS BEFORE ISSUING A FINAL NEGATIVE REPORT   Report Status PENDING   Incomplete   WOUND CULTURE     Status: Normal (Preliminary result)   Collection Time   01/02/12  4:31 PM      Component Value Range Status Comment   Specimen Description WOUND FOOT LEFT   Final    Special Requests Normal   Final    Gram Stain     Final    Value: NO WBC SEEN     ABUNDANT SQUAMOUS EPITHELIAL CELLS PRESENT     ABUNDANT GRAM POSITIVE COCCI     IN PAIRS IN CLUSTERS MODERATE GRAM POSITIVE RODS     FEW GRAM NEGATIVE RODS   Culture Culture reincubated for better growth   Final    Report Status PENDING   Incomplete     Other results:   Imaging: Dg Chest 2 View  01/02/2012  *RADIOLOGY REPORT*  Clinical Data: Fever, diabetes  CHEST - 2 VIEW  Comparison: None.  Findings: Cardiomediastinal silhouette is unremarkable.  No acute infiltrate or pleural effusion.  No pulmonary edema.  Bony thorax is unremarkable.  IMPRESSION: No active disease.   Original Report Authenticated By: Natasha Mead, M.D.    Dg Ankle Complete Left  01/02/2012  *RADIOLOGY REPORT*  Clinical Data: Ankle region soft tissue ulcer  LEFT ANKLE COMPLETE - 3+ VIEW  Comparison: None.  Findings: Frontal, lateral, and oblique views were obtained.  There is no fracture or effusion.  Ankle mortise appears intact.  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  No erosive change or bony destruction.  No soft tissue abscess seen.  IMPRESSION: Osteoarthritic change.  No fracture or joint effusion. No bony destruction.  No soft tissue abscess appreciated.   Original Report Authenticated By: Bretta Bang, M.D.    Mr Foot Left W Wo Contrast  01/02/2012  *RADIOLOGY REPORT*  Clinical Data: Blistering bottom of foot.  Prior amputations. Diabetes.  MRI OF THE LEFT FOREFOOT WITHOUT AND WITH  CONTRAST  Technique:  Multiplanar, multisequence MR imaging was performed both before and after administration of intravenous contrast.  Contrast: 20mL MULTIHANCE GADOBENATE DIMEGLUMINE 529 MG/ML IV SOLN  Comparison: 01/02/2012 radiographs  Findings: Prior amputations of the first and second toes at the MTP joints observed.  Extensive irregular abscess noted dorsal and distal  to the first and second metatarsals, with abnormal but low-level edema in the second metatarsal favoring chronic osteomyelitis, and deformity the head of the second metatarsal.  There is deformity, irregularity, and erosion of the heads of the third and fourth metatarsals which could be from the erosive arthropathy or fracture, but with osteomyelitis also considered a possibility.  However, there is little in the way of edema in the adjacent third and fourth metatarsal shafts, and no phalangeal osteomyelitis is observed.  As expected, there is extensive surrounding infiltrative subcutaneous edema and enhancement particularly around the abscesses but also tracking in the dorsum of the foot.  Gas tracks within the dorsal abscess and within the distal portions of the abscess, and I suspect draining sinus tracts along the amputation bed of the first and second toes.  On image 29 of series 10, draining sinus tract to the medial ball of the foot is also suggested.  Lisfranc ligament is intact.  Visualized midfoot structures intact.  Edema and enhancement tracks along the plantar musculature of the foot.  IMPRESSION:  1.  Considerable geographic abscess dorsal to the first and second metatarsals, tracking in the thickened and enhancing soft tissues, and also extending distal to the head of the first and second metatarsals.  There is probably some drainage along the amputation bed, and there is considerable gas within the abscess. 2.  Low-level edema and enhancement in the second metatarsal, favoring low-level osteomyelitis. 3.  Deformities and  erosions of the heads of the third and fourth metatarsals, chronic since June 2013, potentially related to osteomyelitis, the erosive arthropathy, or prior fractures. However, there is not currently significant edema tracks in the shaft of the third and fourth metatarsals.  4.  Extensive regional cellulitis, especially dorsally. 5.  Edema and enhancement tracking along the plantar musculature of the foot, compatible with myositis/fasciitis   Original Report Authenticated By: Gaylyn Rong, M.D.    Dg Foot Complete Left  01/02/2012  *RADIOLOGY REPORT*  Clinical Data: Left foot ulcer.  LEFT FOOT - COMPLETE 3+ VIEW  Comparison: July 28, 2011.  Findings: Status post amputation of first and second toes. Chronic changes of the distal third and fourth metatarsals are noted which are unchanged compared to prior exam.  These most likely due to prior osteomyelitis. Swelling and gas is noted in the dorsal soft tissues overlying the metatarsals consistent with infection.  IMPRESSION: Findings consistent with cellulitis or ulcer involving the dorsal soft tissues.  No definite evidence of acute osteomyelitis is noted.   Original Report Authenticated By: Lupita Raider.,  M.D.       Medications:    Infusions:    . sodium chloride       Scheduled Medications:    . enoxaparin (LOVENOX) injection  40 mg Subcutaneous Q24H  . [COMPLETED] influenza  inactive virus vaccine  0.5 mL Intramuscular Once  . insulin aspart  0-20 Units Subcutaneous TID WC  . insulin aspart  6 Units Subcutaneous TID WC  . insulin glargine  10 Units Subcutaneous Daily  . lisinopril  10 mg Oral Daily  . ondansetron (ZOFRAN) IV  4 mg Intravenous Once  . piperacillin-tazobactam (ZOSYN)  IV  3.375 g Intravenous Q8H  . [COMPLETED] pneumococcal 23 valent vaccine  0.5 mL Intramuscular Once  . sodium chloride  1,000 mL Intravenous Once  . vancomycin  1,000 mg Intravenous Once  . vancomycin  1,000 mg Intravenous Q12H     PRN  Medications: acetaminophen, acetaminophen, HYDROcodone-acetaminophen    Assessment/  Plan:    Mr. Weisman is a 41 year old man with PMH significant for uncontrolled DM with peripheral neuropathy s/p prior toe amputations (1st and 2nd toes of L foot) admitted on 01/02/12 for left foot swelling, drainage and pain.  1. Infected ft foot ulcer-  Continues to actively drain pus. No signs of systemic infection with resolved leukocytosis 8.4 (down from 12.0 on admission), no fever, and blood cultures NGTD X 1. 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 Superficial wound gram stain revealed Gram positive Rods, Gram positive Cocci with a few Gram negative rods. Orthopaedic surgery has been following the patient. Dr. Victorino Dike will evaluate the patient today for probable surgery today/Monday I&D vs amputation. ABI  was normal.  --Continue IV antibiotics for gram positive, gram negative and anaerobic coverage with Zosyn (day 2) and Vancomycin (day 2) --We appreciate Orthopedic Surgery in their continued management  --Orthopedic evaluation today for surgery today/[place] --Pt remains NPO since midnight in preparation for surgery  --Continue bedrest, non-weight-bearing status, wound care.  --Continue Tylenol for pain as needed --Blood culture NGTD X1   2. Uncontrolled DM2: Hgb A1C 14.5 reflecting poor control secondary to financial constraints. Due to SSI resistance the patient was started on 10 U Lantus in addition to Aspart with meals. Yesterday the patient rec'd 15, 15, and 6 U of aspart. AM CBG was 250 improved from 327 yesterday AM. --Inadequate control, with weight based dosing of 0.2 U/kg the patient would be on 20 U Lantus daily. Gradually up-titrate Lantus to 14 U with continued Aspart with meals. --Continue CBG  --DM Educator has been consulted  3. Hypertension: Systolic blood pressure has been 140-150; however, pt was  only started on anti-hypertensive lisinopril yesterday night. --Continue to monitor --Continue Lisinopril 10 mg po daily for hypertension control and renal protection  4. Acute Kidney Injury: Baseline Cr ~1 in 2012. On admission Cr 1.96, BUN 39. FEna 0.34%. Cre and BUN have increased with Cre 2.03 BUN 41 Most likely prerenal secondary to dehydration from continued hyperglycemia and NPO status since midnight. Urine Na is 23, consistent with a pre-renal etiology. Although serum osmolality was 296 on admission consistent with pseudohyponatremia from hyperglycemeia, it is now almost normal at 278. --Optimize DM2 management --Increase IVF NS @ 169ml/hr --Continue 10 mg po lisinopril po daily --Continue to monitor  5. Hyponatremia: Improved with Na 132 today (128 on admission). Serum osmolality is mild decreased at 278 mOsm/kg, suggesting pseudohyponatremia most likely secondary to hyperglycemia . --Continue to optimize blood glucose control  --IVF NS @ 141ml/hr --Continue to monitor   6. HLD: Triglycerides 280, HDL 10, LDL 132. With DM2, hyperlipidemia patient is at high risk for CAD. Initiate low dose statin prior to discharge. Per ATP III guidelines target LDL<90.  7. Normocytic Anemia: Likely AOCD. Hgb has dropped to 9.8 from 11.1. The patient is asymptomatic. Will continue to monitor.    8. Leukocytosis resolved   9. Disposition: SW consult for financial assistance with medications  10. DVT PPx- Lovenox, SCD Code Status- Full   Length of Stay: 2 days   This is a Psychologist, occupational Note.  The care of the patient was discussed with Dr. Bosie Clos and the assessment and plan formulated with their assistance.  Please see their attached note or addendum for official documentation of the daily encounter.

## 2012-01-05 DIAGNOSIS — E1169 Type 2 diabetes mellitus with other specified complication: Secondary | ICD-10-CM

## 2012-01-05 DIAGNOSIS — L0390 Cellulitis, unspecified: Secondary | ICD-10-CM

## 2012-01-05 LAB — CBC
HCT: 25.9 % — ABNORMAL LOW (ref 39.0–52.0)
HCT: 28.6 % — ABNORMAL LOW (ref 39.0–52.0)
Hemoglobin: 9 g/dL — ABNORMAL LOW (ref 13.0–17.0)
Hemoglobin: 9.9 g/dL — ABNORMAL LOW (ref 13.0–17.0)
MCH: 29.8 pg (ref 26.0–34.0)
MCHC: 34.6 g/dL (ref 30.0–36.0)
RBC: 3.32 MIL/uL — ABNORMAL LOW (ref 4.22–5.81)
RDW: 12 % (ref 11.5–15.5)
WBC: 6 10*3/uL (ref 4.0–10.5)

## 2012-01-05 LAB — BASIC METABOLIC PANEL
BUN: 35 mg/dL — ABNORMAL HIGH (ref 6–23)
BUN: 35 mg/dL — ABNORMAL HIGH (ref 6–23)
CO2: 30 mEq/L (ref 19–32)
Chloride: 97 mEq/L (ref 96–112)
Chloride: 98 mEq/L (ref 96–112)
GFR calc Af Amer: 44 mL/min — ABNORMAL LOW (ref >90)
GFR calc non Af Amer: 38 mL/min — ABNORMAL LOW (ref >90)
GFR calc non Af Amer: 36 mL/min — ABNORMAL LOW (ref >90)
Glucose, Bld: 172 mg/dL — ABNORMAL HIGH (ref 70–99)
Potassium: 4.1 mEq/L (ref 3.5–5.1)
Potassium: 4.4 mEq/L (ref 3.5–5.1)
Sodium: 134 mEq/L — ABNORMAL LOW (ref 135–145)
Sodium: 136 mEq/L (ref 135–145)

## 2012-01-05 LAB — VANCOMYCIN, TROUGH: Vancomycin Tr: 23.4 ug/mL — ABNORMAL HIGH (ref 10.0–20.0)

## 2012-01-05 LAB — GLUCOSE, CAPILLARY
Glucose-Capillary: 283 mg/dL — ABNORMAL HIGH (ref 70–99)
Glucose-Capillary: 281 mg/dL — ABNORMAL HIGH (ref 70–99)
Glucose-Capillary: 110 mg/dL — ABNORMAL HIGH (ref 70–99)
Glucose-Capillary: 185 mg/dL — ABNORMAL HIGH (ref 70–99)

## 2012-01-05 LAB — RETICULOCYTES: Retic Count, Absolute: 30.1 10*3/uL (ref 19.0–186.0)

## 2012-01-05 MED ORDER — SODIUM CHLORIDE 0.9 % IV SOLN
INTRAVENOUS | Status: DC
  Administered 2012-01-06: 125 mL/h via INTRAVENOUS

## 2012-01-05 MED ORDER — LIVING WELL WITH DIABETES BOOK
Freq: Once | Status: AC
  Administered 2012-01-05: 17:00:00
  Filled 2012-01-05: qty 1

## 2012-01-05 MED ORDER — INSULIN GLARGINE 100 UNIT/ML ~~LOC~~ SOLN
4.0000 [IU] | Freq: Once | SUBCUTANEOUS | Status: AC
  Administered 2012-01-05: 4 [IU] via SUBCUTANEOUS

## 2012-01-05 MED ORDER — LORAZEPAM 1 MG PO TABS
1.0000 mg | ORAL_TABLET | Freq: Once | ORAL | Status: AC
  Administered 2012-01-05: 1 mg via ORAL
  Filled 2012-01-05: qty 1

## 2012-01-05 MED ORDER — VANCOMYCIN HCL 1000 MG IV SOLR
750.0000 mg | Freq: Two times a day (BID) | INTRAVENOUS | Status: DC
  Administered 2012-01-06 – 2012-01-07 (×3): 750 mg via INTRAVENOUS
  Filled 2012-01-05 (×6): qty 750

## 2012-01-05 MED ORDER — CHLORHEXIDINE GLUCONATE 4 % EX LIQD
60.0000 mL | Freq: Once | CUTANEOUS | Status: AC
  Administered 2012-01-06: 4 via TOPICAL
  Filled 2012-01-05 (×2): qty 60

## 2012-01-05 MED ORDER — INSULIN GLARGINE 100 UNIT/ML ~~LOC~~ SOLN
14.0000 [IU] | Freq: Every day | SUBCUTANEOUS | Status: DC
Start: 2012-01-06 — End: 2012-01-08
  Administered 2012-01-06 – 2012-01-07 (×2): 14 [IU] via SUBCUTANEOUS

## 2012-01-05 NOTE — Progress Notes (Signed)
Internal Medicine Attending  Date: 01/05/2012  Patient name: Austin Mcdaniel Medical record number: [id number] Date of birth: [birth year] Age: 41 y.o. Gender: male  I saw and evaluated the patient. I reviewed the resident's note by Dr. Bosie Clos and I agree with the resident's findings and plans as documented in her note.

## 2012-01-05 NOTE — Progress Notes (Signed)
ANTIBIOTIC CONSULT NOTE - INITIAL  Pharmacy Consult for vancomycin/zosyn Indication: Left foot gangrene/osteo  Vital Signs: Temp: 98.5 F (36.9 C) (11/11 1416) BP: 129/83 mmHg (11/11 1416) Pulse Rate: 66  (11/11 1416) Intake/Output from previous day: 11/10 0701 - 11/11 0700 In: 2260 [P.O.:810; I.V.:1200; IV Piggyback:250] Out: 1600 [Urine:1600] Intake/Output from this shift:    Labs:  Basename 01/05/12 1909 01/05/12 1155 01/04/12 0617 01/03/12 0849  WBC 7.8 6.0 8.4 --  HGB 9.9* 9.0* 9.8* --  PLT 356 310 320 --  LABCREA -- -- -- 104.10  CREATININE 2.18* 2.06* 2.03* --   Estimated Creatinine Clearance: 56 ml/min (by C-G formula based on Cr of 2.18).  Basename 01/05/12 1910  VANCOTROUGH 23.4*  VANCOPEAK --  Drue Dun --  GENTTROUGH --  GENTPEAK --  GENTRANDOM --  TOBRATROUGH --  TOBRAPEAK --  TOBRARND --  AMIKACINPEAK --  AMIKACINTROU --  AMIKACIN --     Microbiology: Recent Results (from the past 720 hour(s))  CULTURE, BLOOD (ROUTINE X 2)     Status: Normal (Preliminary result)   Collection Time   01/02/12 12:33 PM      Component Value Range Status Comment   Specimen Description BLOOD RIGHT ANTECUBITAL   Final    Special Requests BOTTLES DRAWN AEROBIC AND ANAEROBIC 10CC   Final    Culture  Setup Time 01/02/2012 18:57   Final    Culture     Final    Value:        BLOOD CULTURE RECEIVED NO GROWTH TO DATE CULTURE WILL BE HELD FOR 5 DAYS BEFORE ISSUING A FINAL NEGATIVE REPORT   Report Status PENDING   Incomplete   CULTURE, BLOOD (ROUTINE X 2)     Status: Normal (Preliminary result)   Collection Time   01/02/12 12:38 PM      Component Value Range Status Comment   Specimen Description BLOOD LEFT ANTECUBITAL   Final    Special Requests BOTTLES DRAWN AEROBIC AND ANAEROBIC 10CC   Final    Culture  Setup Time 01/02/2012 18:57   Final    Culture     Final    Value:        BLOOD CULTURE RECEIVED NO GROWTH TO DATE CULTURE WILL BE HELD FOR 5 DAYS BEFORE ISSUING A FINAL  NEGATIVE REPORT   Report Status PENDING   Incomplete   WOUND CULTURE     Status: Normal   Collection Time   01/02/12  4:31 PM      Component Value Range Status Comment   Specimen Description WOUND FOOT LEFT   Final    Special Requests Normal   Final    Gram Stain     Final    Value: NO WBC SEEN     ABUNDANT SQUAMOUS EPITHELIAL CELLS PRESENT     ABUNDANT GRAM POSITIVE COCCI     IN PAIRS IN CLUSTERS MODERATE GRAM POSITIVE RODS     FEW GRAM NEGATIVE RODS   Culture     Final    Value: MODERATE GROUP B STREP(S.AGALACTIAE)ISOLATED     Note: TESTING AGAINST S. AGALACTIAE NOT ROUTINELY PERFORMED DUE TO PREDICTABILITY OF AMP/PEN/VAN SUSCEPTIBILITY.   Report Status 01/04/2012 FINAL   Final   SURGICAL PCR SCREEN     Status: Abnormal   Collection Time   01/05/12 12:10 AM      Component Value Range Status Comment   MRSA, PCR NEGATIVE  NEGATIVE Final    Staphylococcus aureus POSITIVE (*) NEGATIVE Final   Medications:  Vancomycin 1g IV q12 Zosyn 3.375g q8  Assessment: 41 year old male with left foot gangrene/osteo for BKA tomorrow. Patient has been afebrille with normal wbc. He does have some renal impairment with scr 2.18(trending upward), vancomycin trough checked tonight was above goal range.  New dose of 750mg  q12 hours should result in new trough ~17.  Zosyn 11/8 >> Vanc 11/8 >>  11/08 BCx >> NGTD 11/08 WCx >> Mod Group B Strep  Goal of Therapy:  Vancomycin trough level 15-20 mcg/ml  Plan:  No change to zosyn Vancomycin 750mg  IV q12 hours - will let trough trend down more before starting new dose Follow after surgery tomorrow  Severiano Gilbert 01/05/2012,8:38 PM

## 2012-01-05 NOTE — Progress Notes (Signed)
Resident Addendum to Medical Student Note   I have seen and examined the patient, and agree with the the medical student assessment and plan outlined above. Please see my brief note below for additional details.  S: No acute events overnight   OBJECTIVE: VS: Reviewed  Meds: Reviewed  Labs: Reviewed  Imaging: Reviewed   Physical Exam: General: Vital signs reviewed and noted. Well-developed, well-nourished, in no acute distress; alert, appropriate and cooperative throughout examination.  Lungs:  Normal respiratory effort. Clear to auscultation BL without crackles or wheezes.  Heart: RRR. S1 and S2 normal without gallop, murmur, or rubs appreciated  Extremities: erythema to dorsum of left foot, TTP, plantar eschar. Prior amp site with fluctuant ulcer and pus     ASSESSMENT/ PLAN: Pt is a 41 y.o. yo male with a PMHx of poorly controlled diabetes and diabetic foot ulcers s/p prior toe amputations who was admitted on 01/02/2012 with symptoms of left foot pain, swelling, and drainage.  1. Diabetic Foot ulcer: MRI w/ evidence of osteomyelitis, wound culture with gm pos cocci and moderate gm pos rods, on Vanc and Zosyn, remains afebrile with resolved leukocytosis, Ortho following planning for OR Tuesday -cont broad spectrum Antibx -cont Wound Care -cont trend leukocytosis -blood cx pending -NPO after midnight   Lab 01/04/12 0617 01/03/12 0650 01/02/12 1737 01/02/12 1225  WBC 8.4 8.1 10.0 12.0*   2. Diabetes Mellitus Type 2: poorly controlled secondary to noncompliance with meds (financial), improvement in cbg on basal insulin and SSI with meal coverage -DM Educator -cont SSI resistant -increase to Lantus 14units  CBG (last 3)   Basename 01/05/12 0647 01/04/12 2309 01/04/12 1618  GLUCAP 283* 268* 210*    3. Acute Kidney Injury: FENa <1%, NPO overnight with continued hyperglycemia, creatinine continues to slightly rise even with IVF -cont NS IV fluids to 150cc/h -hold  lisinopril -cont to monitor creatinine   Lab 01/04/12 0617 01/03/12 0650 01/02/12 1737 01/02/12 1225  CREATININE 2.03* 1.99* 1.90* 1.96*   4. Hypertension: above goal, pt discharged last year on lisinopril 10 mg qd but noncompliant due to financial concerns -hold lisinopril in setting of AKI -monitor bp   5. Disposition: case manager consulted for financial assistance with medications  Length of Stay: 3   Kristie Cowman, MD PGY2, Internal Medicine Resident 01/05/2012, 10:55 AM

## 2012-01-05 NOTE — Progress Notes (Signed)
Inpatient Diabetes Program Recommendations  AACE/ADA: New Consensus Statement on Inpatient Glycemic Control (2013)  Target Ranges:  Prepandial:   less than 140 mg/dL      Peak postprandial:   less than 180 mg/dL (1-2 hours)      Critically ill patients:  140 - 180 mg/dL   Reason for Visit: Consult - Uncontrolled Diabetes   41 year old man with history of type 2 diabetes mellitus, hypertension, hyperlipidemia, status post left second toe amputation in April of 2012 due to a left second toe diabetic ulcer with osteomyelitis of the distal and middle phalanges, admitted with foot ulceration with associated swelling, erythema, and purulent drainage which he says progressed over the 2 days prior to admission. He reports that he has not taken insulin or other medications for the past year because of difficulty affording them.  Self-employed without insurance and no PCP.  States he's been on Lantus and metformin in the past, but has been unable to afford them.  Has meter but no strips at home.  States he's attended diabetes classes several years ago at [facility].  Results for Austin Mcdaniel, Austin Mcdaniel (MRN [id number]) as of 01/05/2012 13:32  Ref. Range 01/05/2012 11:55  Sodium Latest Range: 135-145 mEq/L 134 (L)  Potassium Latest Range: 3.5-5.1 mEq/L 4.1  Chloride Latest Range: 96-112 mEq/L 97  CO2 Latest Range: 19-32 mEq/L 30  BUN Latest Range: 6-23 mg/dL 35 (H)  Creatinine Latest Range: 0.50-1.35 mg/dL 4.09 (H)  Calcium Latest Range: 8.4-10.5 mg/dL 8.9  GFR calc non Af Amer Latest Range: >90 mL/min 38 (L)  GFR calc Af Amer Latest Range: >90 mL/min 44 (L)  Glucose Latest Range: 70-99 mg/dL 811 (H)  Results for KENECHUKWU, ECKSTEIN (MRN [id number]) as of 01/05/2012 13:32  Ref. Range 01/03/2012 11:12  Hemoglobin A1C Latest Range: <5.7 % 14.5 (H)    Results for MACALISTER, ARNAUD (MRN [id number]) as of 01/05/2012 13:32  Ref. Range 01/03/2012 22:11 01/04/2012 06:45 01/04/2012 11:26 01/04/2012  16:18 01/04/2012 23:09 01/05/2012 06:47 01/05/2012 11:01  Glucose-Capillary Latest Range: 70-99 mg/dL 469 (H) 629 (H) 528 (H) 210 (H) 268 (H) 283 (H) 281 (H)     Uncontrolled DM with financial difficulties in obtaining meds.  Pt states he could purchase generic 70/30 insulin, which is much more affordable than Lantus or Levemir.  Willing to view diabetes videos on pt education channel and will order Living Well with Diabetes book from pharmacy.  Pt scheduled for BKA tomorrow.  Recommendations:  Increase Lantus to 20 units QD. Increase Novolog to 8 units tidwc.  Prior to discharge home, start 70/30 insulin and adjust. Will need PCP to manage diabetes. Would benefit from social work consultaton.  Will continue to follow.

## 2012-01-05 NOTE — Progress Notes (Signed)
Medical Student Daily Progress Note   Subjective:    Interval Events:  Patient seen at bedside.  Doing well, no F/C/N/V.  Pt afebrile.  Pain controlled with Vicodin prn.  Pt was seen by Austin Mcdaniel this morning, and discussed plan for BKA tomorrow.  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    Objective:    Vital Signs:   Temp:  [97.4 F (36.3 C)-98.4 F (36.9 C)] 97.4 F (36.3 C) (11/10 2306) Pulse Rate:  [64-70] 64  (11/10 2306) Resp:  [18-20] 18  (11/10 2306) BP: (146-149)/(76-79) 146/79 mmHg (11/10 2306) SpO2:  [99 %] 99 % (11/10 2306) Last BM Date: 01/03/12   Weights: 24-hour Weight change:   Filed Weights   01/02/12 1700  Weight: 102.059 kg (225 lb)     Intake/Output:   Intake/Output Summary (Last 24 hours) at 01/05/12 0956 Last data filed at 01/05/12 0700  Gross per 24 hour  Intake   2260 ml  Output   1600 ml  Net    660 ml        Physical Exam: GENERAL:  alert and oriented; resting comfortably in bed and in no distress ENT:  moist mucosa LUNGS:  clear to auscultation bilaterally, normal work of breathing, no wheezing/rhonci/rales HEART:  normal rate; regular rhythm; normal S1 and S2, no S3 or S4 appreciated; no murmurs, rubs, or clicks ABDOMEN:  soft, non-tender, normal bowel sounds, no masses palpated EXTREMITIES:  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 5cm eschar.  Purulent drainage from 2nd toe amputation site.  Mild TTP. SKIN:  normal turgor    Labs: Basic Metabolic Panel:  Lab 01/04/12 1478 01/03/12 0650 01/02/12 1737 01/02/12 1225  NA 132* 130* -- 128*  K 3.7 3.8 -- 3.8  CL 95* 90* -- 89*  CO2 26 25 -- 26  GLUCOSE 268* 367* -- 423*  BUN 41* 44* -- 39*  CREATININE 2.03* 1.99* 1.90* 1.96*  CALCIUM 8.5 9.2 -- 9.4  MG -- -- -- --  PHOS -- -- -- --    Liver Function Tests:  Lab 01/02/12 1225  AST 8  ALT 8  ALKPHOS 89   BILITOT 0.2*  PROT 7.7  ALBUMIN 2.6*   No results found for this basename: LIPASE:5,AMYLASE:5 in the last 168 hours No results found for this basename: AMMONIA:3 in the last 168 hours  CBC:  Lab 01/04/12 0617 01/03/12 0650 01/02/12 1737 01/02/12 1225  WBC 8.4 8.1 10.0 12.0*  NEUTROABS -- -- -- 9.1*  HGB 9.8* 11.1* 11.3* 11.4*  HCT 28.6* 31.2* 31.6* 31.6*  MCV 83.6 83.2 83.4 83.2  PLT 320 328 296 332    Cardiac Enzymes: No results found for this basename: CKTOTAL:5,CKMB:5,CKMBINDEX:5,TROPONINI:5 in the last 168 hours  BNP (last 3 results): No results found for this basename: PROBNP:3 in the last 8760 hours  CBG:  Lab 01/05/12 0647 01/04/12 2309 01/04/12 1618 01/04/12 1126 01/04/12 0645  GLUCAP 283* 268* 210* 259* 250*    Coagulation Studies: No results found for this basename: LABPROT:5,INR:5 in the last 72 hours  Microbiology: Results for orders placed during the hospital encounter of 01/02/12  CULTURE, BLOOD (ROUTINE X 2)     Status: Normal (Preliminary result)   Collection Time   01/02/12 12:33 PM      Component Value Range Status Comment   Specimen Description BLOOD RIGHT ANTECUBITAL   Final  Special Requests BOTTLES DRAWN AEROBIC AND ANAEROBIC 10CC   Final    Culture  Setup Time 01/02/2012 18:57   Final    Culture     Final    Value:        BLOOD CULTURE RECEIVED NO GROWTH TO DATE CULTURE WILL BE HELD FOR 5 DAYS BEFORE ISSUING A FINAL NEGATIVE REPORT   Report Status PENDING   Incomplete   CULTURE, BLOOD (ROUTINE X 2)     Status: Normal (Preliminary result)   Collection Time   01/02/12 12:38 PM      Component Value Range Status Comment   Specimen Description BLOOD LEFT ANTECUBITAL   Final    Special Requests BOTTLES DRAWN AEROBIC AND ANAEROBIC 10CC   Final    Culture  Setup Time 01/02/2012 18:57   Final    Culture     Final    Value:        BLOOD CULTURE RECEIVED NO GROWTH TO DATE CULTURE WILL BE HELD FOR 5 DAYS BEFORE ISSUING A FINAL NEGATIVE REPORT    Report Status PENDING   Incomplete   WOUND CULTURE     Status: Normal   Collection Time   01/02/12  4:31 PM      Component Value Range Status Comment   Specimen Description WOUND FOOT LEFT   Final    Special Requests Normal   Final    Gram Stain     Final    Value: NO WBC SEEN     ABUNDANT SQUAMOUS EPITHELIAL CELLS PRESENT     ABUNDANT GRAM POSITIVE COCCI     IN PAIRS IN CLUSTERS MODERATE GRAM POSITIVE RODS     FEW GRAM NEGATIVE RODS   Culture     Final    Value: MODERATE GROUP B STREP(S.AGALACTIAE)ISOLATED     Note: TESTING AGAINST S. AGALACTIAE NOT ROUTINELY PERFORMED DUE TO PREDICTABILITY OF AMP/PEN/VAN SUSCEPTIBILITY.   Report Status 01/04/2012 FINAL   Final   SURGICAL PCR SCREEN     Status: Abnormal   Collection Time   01/05/12 12:10 AM      Component Value Range Status Comment   MRSA, PCR NEGATIVE  NEGATIVE Final    Staphylococcus aureus POSITIVE (*) NEGATIVE Final      Imaging: Dg Chest 2 View  01/02/2012 *RADIOLOGY REPORT* Clinical Data: Fever, diabetes CHEST - 2 VIEW Comparison: None. Findings: Cardiomediastinal silhouette is unremarkable. No acute infiltrate or pleural effusion. No pulmonary edema. Bony thorax is unremarkable. IMPRESSION: No active disease. Original Report Authenticated By: Natasha Mead, M.D.  Dg Ankle Complete Left  01/02/2012 *RADIOLOGY REPORT* Clinical Data: Ankle region soft tissue ulcer LEFT ANKLE COMPLETE - 3+ VIEW Comparison: None. Findings: Frontal, lateral, and oblique views were obtained. There is no fracture or effusion. Ankle mortise appears intact. 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 No erosive change or bony destruction. No soft tissue abscess seen. IMPRESSION: Osteoarthritic change. No fracture or joint effusion. No bony destruction. No soft tissue abscess appreciated. Original Report Authenticated By: Bretta Bang, M.D.  Mr Foot Left W Wo Contrast  01/02/2012 *RADIOLOGY REPORT* Clinical Data: Blistering bottom of foot.  Prior amputations. Diabetes. MRI OF THE LEFT FOREFOOT WITHOUT AND WITH CONTRAST Technique: Multiplanar, multisequence MR imaging was performed both before and after administration of intravenous contrast. Contrast: 20mL MULTIHANCE GADOBENATE DIMEGLUMINE 529 MG/ML IV SOLN Comparison: 01/02/2012 radiographs Findings: Prior amputations of the first and second toes at the MTP joints observed. Extensive irregular abscess noted dorsal and distal to  the first and second metatarsals, with abnormal but low-level edema in the second metatarsal favoring chronic osteomyelitis, and deformity the head of the second metatarsal. There is deformity, irregularity, and erosion of the heads of the third and fourth metatarsals which could be from the erosive arthropathy or fracture, but with osteomyelitis also considered a possibility. However, there is little in the way of edema in the adjacent third and fourth metatarsal shafts, and no phalangeal osteomyelitis is observed. As expected, there is extensive surrounding infiltrative subcutaneous edema and enhancement particularly around the abscesses but also tracking in the dorsum of the foot. Gas tracks within the dorsal abscess and within the distal portions of the abscess, and I suspect draining sinus tracts along the amputation bed of the first and second toes. On image 29 of series 10, draining sinus tract to the medial ball of the foot is also suggested. Lisfranc ligament is intact. Visualized midfoot structures intact. Edema and enhancement tracks along the plantar musculature of the foot. IMPRESSION: 1. Considerable geographic abscess dorsal to the first and second metatarsals, tracking in the thickened and enhancing soft tissues, and also extending distal to the head of the first and second metatarsals. There is probably some drainage along the amputation bed, and there is considerable gas within the abscess. 2. Low-level edema and enhancement in the second metatarsal, favoring  low-level osteomyelitis. 3. Deformities and erosions of the heads of the third and fourth metatarsals, chronic since June 2013, potentially related to osteomyelitis, the erosive arthropathy, or prior fractures. However, there is not currently significant edema tracks in the shaft of the third and fourth metatarsals. 4. Extensive regional cellulitis, especially dorsally. 5. Edema and enhancement tracking along the plantar musculature of the foot, compatible with myositis/fasciitis Original Report Authenticated By: Gaylyn Rong, M.D.  Dg Foot Complete Left  01/02/2012 *RADIOLOGY REPORT* Clinical Data: Left foot ulcer. LEFT FOOT - COMPLETE 3+ VIEW Comparison: July 28, 2011. Findings: Status post amputation of first and second toes. Chronic changes of the distal third and fourth metatarsals are noted which are unchanged compared to prior exam. These most likely due to prior osteomyelitis. Swelling and gas is noted in the dorsal soft tissues overlying the metatarsals consistent with infection. IMPRESSION: Findings consistent with cellulitis or ulcer involving the dorsal soft tissues. No definite evidence of acute osteomyelitis is noted. Original Report Authenticated By: Lupita Raider., M.D.     Medications:    Infusions:    . sodium chloride 150 mL/hr at 01/05/12 0700     Scheduled Medications:    . enoxaparin (LOVENOX) injection  40 mg Subcutaneous Q24H  . insulin aspart  0-20 Units Subcutaneous TID WC  . insulin aspart  6 Units Subcutaneous TID WC  . insulin glargine  10 Units Subcutaneous Daily  . lisinopril  10 mg Oral Daily  . [COMPLETED] ondansetron (ZOFRAN) IV  4 mg Intravenous Once  . piperacillin-tazobactam (ZOSYN)  IV  3.375 g Intravenous Q8H  . sodium chloride  1,000 mL Intravenous Once  . vancomycin  1,000 mg Intravenous Q12H  . [DISCONTINUED] vancomycin  1,000 mg Intravenous Once     PRN Medications: acetaminophen, acetaminophen, HYDROcodone-acetaminophen    Assessment/  Plan:    Austin Mcdaniel is a 41 year old man with PMH significant for uncontrolled DM with peripheral neuropathy s/p prior toe amputations (1st and 2nd toes of L foot) admitted on 01/02/12 for left foot swelling, drainage and pain.   1. Infected Lt foot ulcer- Continues to actively drain pus. No  signs of systemic infection with resolved leukocytosis 8.4 (down from 12.0 on admission), no fever, and blood cultures NGTD X 1. 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 Superficial wound gram stain revealed Gram positive Rods, Gram positive Cocci with a few Gram negative rods, Culture reveals GBS. Orthopaedic surgery has been following the patient. Austin Mcdaniel evaluated patient today and scheduled BKA surgery for tomorrow. --Continue IV antibiotics for gram positive, gram negative and anaerobic coverage with Zosyn (day 3) and Vancomycin (day 3)  --We appreciate Orthopedic Surgery in their continued management  --Pt NPO midnight in preparation for surgery  --Continue bedrest, non-weight-bearing status, wound care.  --Continue Tylenol/Vicodin for pain as needed  --Continue to follow Blood Cultures 2. Uncontrolled DM2: Hgb A1C 14.5 reflecting poor control secondary to financial constraints.  --Inadequate control, will increase lantus to 14 units.  Continue Novolog  6 units with meals.  Continue RISS. --DM Educator has been consulted  3. Hypertension: Systolic blood pressure has been 140-150 --Hold Lisinopril due to possible prerenal AKI 4. Acute Kidney Injury: Baseline Cr ~1 in 2012. On admission Cr 1.96, BUN 39. FEna 0.34%. Uosmo 482. Cre and BUN have increased with Cre 2.03 BUN 41 Most likely prerenal secondary to dehydration from continued hyperglycemia and NPO status since midnight. Urine Na is 23, consistent with a pre-renal etiology. Although serum osmolality was 296 on admission consistent with pseudohyponatremia from  hyperglycemeia, it is now almost normal at 278.  --Optimize DM2 management  --IVF NS @ 161ml/hr  --Hold lisinopril --Continue to monitor  5. Pseudohyponatremia: Na 132, Corrected Na 134.7.   --Check BMP today --Continue to optimize blood glucose control  --IVF NS @ 164ml/hr  --Continue to monitor  6. Anxiety: Pt reports anxiety about surgery tomorrow.  Has a previous history of Anxiety for which he took Lexapro --Will give Ativan 1mg . --Will consider starting SSRI prior to D/C. 7. HLD: Non-fastingTriglycerides 280, HDL 10, LDL 132. With DM2, hyperlipidemia patient is at high risk for CAD. May initiate low dose statin prior to discharge. Per ATP III guidelines target LDL<90.  8. Normocytic Anemia: Likely AOCD. Hgb has dropped to 9.8 from 11.1. The patient is asymptomatic. Will continue to monitor.  --Order Anemia panel --Will consider transfusing if symptomatic or Hgb <7.0 9. Disposition: Orthopaedic surgery BKA tomorrow.  SW consult for financial assistance with medications  10. DVT PPx- Lovenox Code Status- Full       Length of Stay: 3 days   This is a Psychologist, occupational Note.  The care of the patient was discussed with Dr. Bosie Clos and the assessment and plan formulated with their assistance.  Please see their attached note or addendum for official documentation of the daily encounter.

## 2012-01-05 NOTE — Progress Notes (Signed)
Subjective: Pt known to me for previous left forefoot infection and amputation.  Pt began having trouble again in may.  Had some success with local wound care until this past week.  He says the foot swelled suddenly and got painful.  He has been on abx in the hospital over the weekend.  Denies f/c/n/v/wt loss.  Objective: Vital signs in last 24 hours: Temp:  [97.4 F (36.3 C)-98.4 F (36.9 C)] 97.4 F (36.3 C) (11/10 2306) Pulse Rate:  [64-70] 64  (11/10 2306) Resp:  [18-20] 18  (11/10 2306) BP: (146-149)/(76-79) 146/79 mmHg (11/10 2306) SpO2:  [99 %] 99 % (11/10 2306)  Intake/Output from previous day: 11/10 0701 - 11/11 0700 In: 2260 [P.O.:810; I.V.:1200; IV Piggyback:250] Out: 1600 [Urine:1600] Intake/Output this shift:     Basename 01/04/12 0617 01/03/12 0650 01/02/12 1737 01/02/12 1225  HGB 9.8* 11.1* 11.3* 11.4*    Basename 01/04/12 0617 01/03/12 0650  WBC 8.4 8.1  RBC 3.42* 3.75*  HCT 28.6* 31.2*  PLT 320 328    Basename 01/04/12 0617 01/03/12 0650  NA 132* 130*  K 3.7 3.8  CL 95* 90*  CO2 26 25  BUN 41* 44*  CREATININE 2.03* 1.99*  GLUCOSE 268* 367*  CALCIUM 8.5 9.2   No results found for this basename: LABPT:2,INR:2 in the last 72 hours  PE:  WN WD male in nad.  A and O x 4.  Mood and affect normal.  EOMI.  Respirations unlabored.  L foot with dorsal gangrene to level of midfoot with complete compromise of dorsal skin and plantar ulcer under 1st MT head.  no lymphadenopathy.  No sens to LT at foot.  Heelcord tight.  5/5 strength in PF and DF of the ankle.  Assessment/Plan: L foot gangrene - to OR tomorrow of BKA.  Given the extensive nature of the necrosis, there is insufficient skin to allow amputation at a more distal level.  Pt can eat today and will be put on the OR schedule for tomorrow for BKA.  The risks and benefits of the alternative treatment options have been discussed in detail.  The patient wishes to proceed with surgery and specifically  understands risks of bleeding, infection, nerve damage, blood clots, need for additional surgery, amputation and death.    Toni Arthurs January 25, 2012, 7:52 AM

## 2012-01-05 NOTE — Consult Note (Signed)
WOC will not see pt, pt just seen prior to my arrival for orthopedic eval.  Dr. Victorino Dike to take pt to OR tom. For BKA. Re consult if needed, will not follow at this time. Thanks  Demitrius Crass [employer], CWOCN [phone])

## 2012-01-05 NOTE — Progress Notes (Signed)
Utilization review completed. Vetta Couzens, RN, BSN. 

## 2012-01-06 ENCOUNTER — Inpatient Hospital Stay (HOSPITAL_COMMUNITY): Payer: MEDICAID | Admitting: Anesthesiology

## 2012-01-06 ENCOUNTER — Encounter (HOSPITAL_COMMUNITY): Payer: Self-pay | Admitting: Anesthesiology

## 2012-01-06 ENCOUNTER — Encounter (HOSPITAL_COMMUNITY)
Admission: EM | Disposition: A | Discharge status: Home Health | Payer: Self-pay | Source: Home / Self Care | Attending: Internal Medicine

## 2012-01-06 ENCOUNTER — Encounter (HOSPITAL_COMMUNITY): Payer: Self-pay | Admitting: Certified Registered Nurse Anesthetist

## 2012-01-06 HISTORY — PX: AMPUTATION: SHX166

## 2012-01-06 LAB — CBC
Hemoglobin: 9.5 g/dL — ABNORMAL LOW (ref 13.0–17.0)
MCH: 29.7 pg (ref 26.0–34.0)
MCV: 86.3 fL (ref 78.0–100.0)
Platelets: 325 10*3/uL (ref 150–400)
RBC: 3.2 MIL/uL — ABNORMAL LOW (ref 4.22–5.81)
WBC: 7.3 10*3/uL (ref 4.0–10.5)

## 2012-01-06 LAB — BASIC METABOLIC PANEL
CO2: 26 mEq/L (ref 19–32)
Chloride: 102 mEq/L (ref 96–112)
Creatinine, Ser: 1.93 mg/dL — ABNORMAL HIGH (ref 0.50–1.35)
Glucose, Bld: 249 mg/dL — ABNORMAL HIGH (ref 70–99)
Sodium: 136 mEq/L (ref 135–145)

## 2012-01-06 LAB — GLUCOSE, CAPILLARY
Glucose-Capillary: 188 mg/dL — ABNORMAL HIGH (ref 70–99)
Glucose-Capillary: 165 mg/dL — ABNORMAL HIGH (ref 70–99)
Glucose-Capillary: 120 mg/dL — ABNORMAL HIGH (ref 70–99)

## 2012-01-06 LAB — FERRITIN: Ferritin: 678 ng/mL — ABNORMAL HIGH (ref 22–322)

## 2012-01-06 LAB — IRON AND TIBC
Iron: 31 ug/dL — ABNORMAL LOW (ref 42–135)
TIBC: 195 ug/dL — ABNORMAL LOW (ref 215–435)

## 2012-01-06 SURGERY — AMPUTATION BELOW KNEE
Anesthesia: Monitor Anesthesia Care | Site: Leg Lower | Laterality: Left | Wound class: Dirty or Infected

## 2012-01-06 MED ORDER — PROPOFOL INFUSION 10 MG/ML OPTIME
INTRAVENOUS | Status: DC | PRN
  Administered 2012-01-06: 50 ug/kg/min via INTRAVENOUS

## 2012-01-06 MED ORDER — BUPIVACAINE HCL (PF) 0.75 % IJ SOLN
INTRAMUSCULAR | Status: DC | PRN
  Administered 2012-01-06: 12 mg

## 2012-01-06 MED ORDER — PHENYLEPHRINE HCL 10 MG/ML IJ SOLN
INTRAMUSCULAR | Status: DC | PRN
  Administered 2012-01-06 (×4): 80 ug via INTRAVENOUS

## 2012-01-06 MED ORDER — HYDROMORPHONE HCL PF 1 MG/ML IJ SOLN: 0.5000 mg | INTRAMUSCULAR | Status: DC | PRN

## 2012-01-06 MED ORDER — OXYCODONE HCL 5 MG/5ML PO SOLN: 5.0000 mg | Freq: Once | ORAL | Status: DC | PRN

## 2012-01-06 MED ORDER — METOCLOPRAMIDE HCL 10 MG PO TABS: 5.0000 mg | ORAL_TABLET | Freq: Three times a day (TID) | ORAL | Status: DC | PRN

## 2012-01-06 MED ORDER — 0.9 % SODIUM CHLORIDE (POUR BTL) OPTIME
TOPICAL | Status: DC | PRN
  Administered 2012-01-06: 1000 mL

## 2012-01-06 MED ORDER — ONDANSETRON HCL 4 MG PO TABS: 4.0000 mg | ORAL_TABLET | Freq: Four times a day (QID) | ORAL | Status: DC | PRN

## 2012-01-06 MED ORDER — MIDAZOLAM HCL 5 MG/5ML IJ SOLN
INTRAMUSCULAR | Status: DC | PRN
  Administered 2012-01-06: 2 mg via INTRAVENOUS

## 2012-01-06 MED ORDER — DOCUSATE SODIUM 100 MG PO CAPS
100.0000 mg | ORAL_CAPSULE | Freq: Two times a day (BID) | ORAL | Status: DC
  Administered 2012-01-06 – 2012-01-08 (×4): 100 mg via ORAL
  Filled 2012-01-06 (×5): qty 1

## 2012-01-06 MED ORDER — BACITRACIN ZINC 500 UNIT/GM EX OINT
TOPICAL_OINTMENT | CUTANEOUS | Status: AC
  Filled 2012-01-06: qty 15

## 2012-01-06 MED ORDER — METOCLOPRAMIDE HCL 5 MG/ML IJ SOLN: 5.0000 mg | Freq: Three times a day (TID) | INTRAMUSCULAR | Status: DC | PRN

## 2012-01-06 MED ORDER — ONDANSETRON HCL 4 MG/2ML IJ SOLN
INTRAMUSCULAR | Status: DC | PRN
  Administered 2012-01-06: 4 mg via INTRAVENOUS

## 2012-01-06 MED ORDER — MORPHINE SULFATE 2 MG/ML IJ SOLN
2.0000 mg | INTRAMUSCULAR | Status: DC | PRN
  Administered 2012-01-06 – 2012-01-07 (×8): 4 mg via INTRAVENOUS
  Filled 2012-01-06: qty 1
  Filled 2012-01-06 (×8): qty 2

## 2012-01-06 MED ORDER — OXYCODONE HCL 5 MG PO TABS: 5.0000 mg | ORAL_TABLET | ORAL | Status: DC | PRN

## 2012-01-06 MED ORDER — PROPOFOL 10 MG/ML IV BOLUS
INTRAVENOUS | Status: DC | PRN
  Administered 2012-01-06: 50 mg via INTRAVENOUS

## 2012-01-06 MED ORDER — ENOXAPARIN SODIUM 40 MG/0.4ML ~~LOC~~ SOLN
40.0000 mg | SUBCUTANEOUS | Status: DC
  Administered 2012-01-07 – 2012-01-08 (×2): 40 mg via SUBCUTANEOUS
  Filled 2012-01-06 (×4): qty 0.4

## 2012-01-06 MED ORDER — OXYCODONE HCL 5 MG PO TABS: 5.0000 mg | ORAL_TABLET | Freq: Once | ORAL | Status: DC | PRN

## 2012-01-06 MED ORDER — INSULIN ASPART 100 UNIT/ML ~~LOC~~ SOLN: 8.0000 [IU] | Freq: Three times a day (TID) | SUBCUTANEOUS | Status: DC

## 2012-01-06 MED ORDER — HYDROMORPHONE HCL PF 1 MG/ML IJ SOLN
0.2500 mg | INTRAMUSCULAR | Status: DC | PRN
  Administered 2012-01-06: 0.5 mg via INTRAVENOUS
  Filled 2012-01-06: qty 1

## 2012-01-06 MED ORDER — SENNA 8.6 MG PO TABS
1.0000 | ORAL_TABLET | Freq: Two times a day (BID) | ORAL | Status: DC
  Administered 2012-01-07 – 2012-01-08 (×4): 8.6 mg via ORAL
  Filled 2012-01-06 (×6): qty 1

## 2012-01-06 MED ORDER — BACITRACIN ZINC 500 UNIT/GM EX OINT
TOPICAL_OINTMENT | CUTANEOUS | Status: DC | PRN
  Administered 2012-01-06: 1 via TOPICAL

## 2012-01-06 MED ORDER — OXYCODONE HCL 5 MG PO TABS
5.0000 mg | ORAL_TABLET | ORAL | Status: DC | PRN
Start: 2012-01-06 — End: 2012-01-08
  Administered 2012-01-07 – 2012-01-08 (×3): 15 mg via ORAL
  Filled 2012-01-06 (×3): qty 3

## 2012-01-06 MED ORDER — ACETAMINOPHEN 325 MG PO TABS
ORAL_TABLET | ORAL | Status: AC
  Filled 2012-01-06: qty 2

## 2012-01-06 MED ORDER — LACTATED RINGERS IV SOLN: INTRAVENOUS | Status: DC

## 2012-01-06 MED ORDER — FENTANYL CITRATE 0.05 MG/ML IJ SOLN
INTRAMUSCULAR | Status: DC | PRN
  Administered 2012-01-06 (×3): 50 ug via INTRAVENOUS

## 2012-01-06 MED ORDER — LACTATED RINGERS IV SOLN
INTRAVENOUS | Status: DC | PRN
  Administered 2012-01-06 (×2): via INTRAVENOUS

## 2012-01-06 MED ORDER — ONDANSETRON HCL 4 MG/2ML IJ SOLN
4.0000 mg | Freq: Four times a day (QID) | INTRAMUSCULAR | Status: DC | PRN
  Administered 2012-01-06: 4 mg via INTRAVENOUS
  Filled 2012-01-06: qty 2

## 2012-01-06 SURGICAL SUPPLY — 53 items
BANDAGE ELASTIC 6 VELCRO ST LF (GAUZE/BANDAGES/DRESSINGS) ×2 IMPLANT
BANDAGE ESMARK 6X9 LF (GAUZE/BANDAGES/DRESSINGS) ×1 IMPLANT
BLADE SAW RECIP 87.9 MT (BLADE) ×2 IMPLANT
BNDG COHESIVE 6X5 TAN STRL LF (GAUZE/BANDAGES/DRESSINGS) ×4 IMPLANT
BNDG ESMARK 6X9 LF (GAUZE/BANDAGES/DRESSINGS) ×2
BNDG GAUZE STRTCH 6 (GAUZE/BANDAGES/DRESSINGS) IMPLANT
CHLORAPREP W/TINT 26ML (MISCELLANEOUS) ×2 IMPLANT
CLOTH BEACON ORANGE TIMEOUT ST (SAFETY) ×2 IMPLANT
COVER SURGICAL LIGHT HANDLE (MISCELLANEOUS) ×2 IMPLANT
CUFF TOURNIQUET SINGLE 34IN LL (TOURNIQUET CUFF) ×2 IMPLANT
CUFF TOURNIQUET SINGLE 44IN (TOURNIQUET CUFF) IMPLANT
DRAPE EXTREMITY T 121X128X90 (DRAPE) ×2 IMPLANT
DRAPE INCISE IOBAN 66X45 STRL (DRAPES) ×2 IMPLANT
DRAPE PROXIMA HALF (DRAPES) ×4 IMPLANT
DRAPE U-SHAPE 47X51 STRL (DRAPES) ×4 IMPLANT
DRSG ADAPTIC 3X8 NADH LF (GAUZE/BANDAGES/DRESSINGS) ×2 IMPLANT
DRSG MEPITEL 4X7.2 (GAUZE/BANDAGES/DRESSINGS) ×2 IMPLANT
ELECT CAUTERY BLADE 6.4 (BLADE) IMPLANT
ELECT REM PT RETURN 9FT ADLT (ELECTROSURGICAL) ×2
ELECTRODE REM PT RTRN 9FT ADLT (ELECTROSURGICAL) ×1 IMPLANT
EVACUATOR 1/8 PVC DRAIN (DRAIN) IMPLANT
GLOVE BIO SURGEON STRL SZ8 (GLOVE) ×2 IMPLANT
GLOVE BIOGEL PI IND STRL 8 (GLOVE) ×1 IMPLANT
GLOVE BIOGEL PI INDICATOR 8 (GLOVE) ×1
GOWN PREVENTION PLUS XLARGE (GOWN DISPOSABLE) ×2 IMPLANT
GOWN STRL NON-REIN LRG LVL3 (GOWN DISPOSABLE) ×2 IMPLANT
IMMOBILIZER KNEE 20 (SOFTGOODS) ×2
IMMOBILIZER KNEE 20 THIGH 36 (SOFTGOODS) ×1 IMPLANT
KIT BASIN OR (CUSTOM PROCEDURE TRAY) ×2 IMPLANT
KIT ROOM TURNOVER OR (KITS) ×2 IMPLANT
MANIFOLD NEPTUNE II (INSTRUMENTS) ×2 IMPLANT
NS IRRIG 1000ML POUR BTL (IV SOLUTION) ×2 IMPLANT
PACK GENERAL/GYN (CUSTOM PROCEDURE TRAY) ×2 IMPLANT
PAD ARMBOARD 7.5X6 YLW CONV (MISCELLANEOUS) ×2 IMPLANT
PAD CAST 4YDX4 CTTN HI CHSV (CAST SUPPLIES) ×1 IMPLANT
PADDING CAST COTTON 4X4 STRL (CAST SUPPLIES) ×1
PADDING CAST COTTON 6X4 STRL (CAST SUPPLIES) ×2 IMPLANT
SPONGE GAUZE 4X4 12PLY (GAUZE/BANDAGES/DRESSINGS) ×2 IMPLANT
SPONGE LAP 18X18 X RAY DECT (DISPOSABLE) ×2 IMPLANT
STAPLER VISISTAT 35W (STAPLE) ×2 IMPLANT
STOCKINETTE IMPERVIOUS LG (DRAPES) ×2 IMPLANT
SUT MNCRL AB 3-0 PS2 18 (SUTURE) ×2 IMPLANT
SUT PDS 2 0 (SUTURE) ×4 IMPLANT
SUT PDS AB 0 CT 36 (SUTURE) ×2 IMPLANT
SUT PROLENE 3 0 PS 2 (SUTURE) ×2 IMPLANT
SUT PROLENE 3 0 SH 48 (SUTURE) IMPLANT
SUT SILK 2 0 (SUTURE) ×1
SUT SILK 2-0 18XBRD TIE 12 (SUTURE) ×1 IMPLANT
SWAB COLLECTION DEVICE MRSA (MISCELLANEOUS) IMPLANT
TOWEL OR 17X24 6PK STRL BLUE (TOWEL DISPOSABLE) ×2 IMPLANT
TOWEL OR 17X26 10 PK STRL BLUE (TOWEL DISPOSABLE) ×2 IMPLANT
TUBE ANAEROBIC SPECIMEN COL (MISCELLANEOUS) IMPLANT
WATER STERILE IRR 1000ML POUR (IV SOLUTION) ×2 IMPLANT

## 2012-01-06 NOTE — Interval H&P Note (Signed)
History and Physical Interval Note:  01/06/2012 3:37 PM  Austin Mcdaniel  has presented today for surgery, with the diagnosis of LEFT FOOT GANGRENE  The various methods of treatment have been discussed with the patient and family. After consideration of risks, benefits and other options for treatment, the patient has consented to left below knee amputation as a surgical intervention .  The patient's history has been reviewed, patient examined, no change in status, stable for surgery.  I have reviewed the patient's chart and labs.  Questions were answered to the patient's satisfaction.     Toni Arthurs

## 2012-01-06 NOTE — Brief Op Note (Signed)
01/02/2012 - 01/06/2012  4:55 PM  PATIENT:  Austin Mcdaniel  41 y.o. male  PRE-OPERATIVE DIAGNOSIS:  LEFT FOOT GANGRENE  POST-OPERATIVE DIAGNOSIS:  LEFT FOOT GANGRENE  Procedure(s): Left below knee amputation  SURGEON:  Toni Arthurs, MD  ASSISTANT: n/a  ANESTHESIA:   spinal  EBL:  minimal   TOURNIQUET:   Total Tourniquet Time Documented: Thigh (Left) - 37 minutes  COMPLICATIONS:  None apparent  DISPOSITION:  Extubated, awake and stable to recovery.  DICTATION ID:  [id number]

## 2012-01-06 NOTE — Anesthesia Postprocedure Evaluation (Signed)
  Anesthesia Post-op Note  Patient: Austin Mcdaniel  Procedure(s) Performed: Procedure(s) (LRB) with comments: AMPUTATION BELOW KNEE (Left) - DR.HEWITT WOULD LIKE TO FOLLOW HIS OTHER AFTERNOON CASE STARTING AROUND 1700-1715  Patient Location: PACU  Anesthesia Type:Spinal  Level of Consciousness: awake  Airway and Oxygen Therapy: Patient Spontanous Breathing  Post-op Pain: none  Post-op Assessment: Post-op Vital signs reviewed  Post-op Vital Signs: stable  Complications: No apparent anesthesia complications

## 2012-01-06 NOTE — Anesthesia Preprocedure Evaluation (Addendum)
Anesthesia Evaluation  Patient identified by MRN, date of birth, ID band Patient awake    Reviewed: Allergy & Precautions, H&P , NPO status , Patient's Chart, lab work & pertinent test results  Airway Mallampati: II TM Distance: >3 FB Neck ROM: Full    Dental No notable dental hx. (+) Teeth Intact and Dental Advisory Given   Pulmonary neg pulmonary ROS,  breath sounds clear to auscultation  Pulmonary exam normal       Cardiovascular hypertension, Rhythm:Regular Rate:Normal     Neuro/Psych negative neurological ROS  negative psych ROS   GI/Hepatic Neg liver ROS, GERD-  Medicated and Controlled,  Endo/Other  diabetes, Type 2, Oral Hypoglycemic Agents  Renal/GU negative Renal ROS  negative genitourinary   Musculoskeletal   Abdominal   Peds  Hematology negative hematology ROS (+)   Anesthesia Other Findings   Reproductive/Obstetrics negative OB ROS                           Anesthesia Physical Anesthesia Plan  ASA: III  Anesthesia Plan: MAC and Spinal   Post-op Pain Management:    Induction: Intravenous  Airway Management Planned: Simple Face Mask  Additional Equipment:   Intra-op Plan:   Post-operative Plan:   Informed Consent: I have reviewed the patients History and Physical, chart, labs and discussed the procedure including the risks, benefits and alternatives for the proposed anesthesia with the patient or authorized representative who has indicated his/her understanding and acceptance.   Dental advisory given  Plan Discussed with: CRNA  Anesthesia Plan Comments:         Anesthesia Quick Evaluation

## 2012-01-06 NOTE — Anesthesia Procedure Notes (Signed)
Spinal  Patient location during procedure: OR Start time: 01/06/2012 1:45 PM End time: 01/06/2012 1:52 PM Staffing Anesthesiologist: Eduard Clos EDMOND Performed by: anesthesiologist  Preanesthetic Checklist Completed: patient identified, surgical consent, pre-op evaluation, timeout performed, IV checked, risks and benefits discussed and monitors and equipment checked Spinal Block Patient position: sitting Prep: Betadine Patient monitoring: cardiac monitor, continuous pulse ox and blood pressure Approach: midline Location: L3-4 Injection technique: single-shot Needle Needle type: Spinocan  Needle gauge: 25 G Needle length: 9 cm Assessment Sensory level: T10 Additional Notes Functioning IV was confirmed and monitors were applied. Sterile prep and drape, including hand hygiene and sterile gloves were used. The patient was positioned and the spine was prepped. The skin was anesthetized with lidocaine.  Free flow of clear CSF was obtained prior to injecting local anesthetic into the CSF.  The spinal needle aspirated freely following injection.  The needle was carefully withdrawn.  The patient tolerated the procedure well.

## 2012-01-06 NOTE — Progress Notes (Signed)
Internal Medicine Attending  Date: 01/06/2012  Patient name: Austin Mcdaniel Medical record number: [id number] Date of birth: Jun 29, 1970 Age: 41 y.o. Gender: male  I saw and evaluated the patient on a.m. rounds with house staff. I reviewed the resident's note by Dr. Bosie Clos and I agree with the resident's findings and plans as documented in her note.

## 2012-01-06 NOTE — Progress Notes (Signed)
Medical Student Daily Progress Note   Subjective:    Interval Events:  Patient doing well. No complaints.  Anxiety under control.  Afebrile, no events overnight.  Dr. Victorino Dike planning on BKA today.  Currently NPO.    Objective:    Vital Signs:   Temp:  [97.3 F (36.3 C)-98.5 F (36.9 C)] 97.3 F (36.3 C) (11/12 0527) Pulse Rate:  [56-70] 56  (11/12 0527) Resp:  [18-20] 18  (11/12 0527) BP: (123-151)/(70-83) 123/70 mmHg (11/12 0527) SpO2:  [96 %-98 %] 96 % (11/12 0527) Last BM Date: 01/03/12   Weights: 24-hour Weight change:   Filed Weights   01/02/12 1700  Weight: 102.059 kg (225 lb)     Intake/Output:   Intake/Output Summary (Last 24 hours) at 01/06/12 0728 Last data filed at 01/05/12 1700  Gross per 24 hour  Intake    720 ml  Output      0 ml  Net    720 ml       Physical Exam: GENERAL:  alert and oriented; resting comfortably in bed and in no distress ENT:  moist mucosa LUNGS:  clear to auscultation bilaterally, normal work of breathing HEART:  normal rate; regular rhythm; normal S1 and S2, no S3 or S4 appreciated; no murmurs, rubs, or clicks ABDOMEN:  soft, non-tender, normal bowel sounds, no masses palpated EXTREMITIES:  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 5cm eschar. Purulent drainage from 2nd toe amputation site. Mild TTP.    Labs: Basic Metabolic Panel:  Lab 01/05/12 1610 01/05/12 1155 01/04/12 0617 01/03/12 0650 01/02/12 1737 01/02/12 1225  NA 136 134* 132* 130* -- 128*  K 4.4 4.1 3.7 3.8 -- 3.8  CL 98 97 95* 90* -- 89*  CO2 30 30 26 25  -- 26  GLUCOSE 172* 243* 268* 367* -- 423*  BUN 35* 35* 41* 44* -- 39*  CREATININE 2.18* 2.06* 2.03* 1.99* 1.90* --  CALCIUM 8.9 8.9 8.5 -- -- --  MG -- -- -- -- -- --  PHOS -- -- -- -- -- --    Liver Function Tests:  Lab 01/02/12 1225  AST 8  ALT 8  ALKPHOS 89  BILITOT 0.2*  PROT 7.7  ALBUMIN 2.6*   No results found for this basename: LIPASE:5,AMYLASE:5 in the last 168 hours No results found for  this basename: AMMONIA:3 in the last 168 hours  CBC:  Lab 01/05/12 1909 01/05/12 1155 01/04/12 0617 01/03/12 0650 01/02/12 1737 01/02/12 1225  WBC 7.8 6.0 8.4 8.1 10.0 --  NEUTROABS -- -- -- -- -- 9.1*  HGB 9.9* 9.0* 9.8* 11.1* 11.3* --  HCT 28.6* 25.9* 28.6* 31.2* 31.6* --  MCV 86.1 86.0 83.6 83.2 83.4 --  PLT 356 310 320 328 296 --    Cardiac Enzymes: No results found for this basename: CKTOTAL:5,CKMB:5,CKMBINDEX:5,TROPONINI:5 in the last 168 hours  BNP (last 3 results): No results found for this basename: PROBNP:3 in the last 8760 hours  CBG:  Lab 01/06/12 0644 01/05/12 2143 01/05/12 1613 01/05/12 1101 01/05/12 0647  GLUCAP 243* 185* 110* 281* 283*    Coagulation Studies: No results found for this basename: LABPROT:5,INR:5 in the last 72 hours  Microbiology: Results for orders placed during the hospital encounter of 01/02/12  CULTURE, BLOOD (ROUTINE X 2)     Status: Normal (Preliminary result)   Collection Time   01/02/12 12:33 PM      Component Value Range Status Comment   Specimen Description BLOOD RIGHT ANTECUBITAL   Final  Special Requests BOTTLES DRAWN AEROBIC AND ANAEROBIC 10CC   Final    Culture  Setup Time 01/02/2012 18:57   Final    Culture     Final    Value:        BLOOD CULTURE RECEIVED NO GROWTH TO DATE CULTURE WILL BE HELD FOR 5 DAYS BEFORE ISSUING A FINAL NEGATIVE REPORT   Report Status PENDING   Incomplete   CULTURE, BLOOD (ROUTINE X 2)     Status: Normal (Preliminary result)   Collection Time   01/02/12 12:38 PM      Component Value Range Status Comment   Specimen Description BLOOD LEFT ANTECUBITAL   Final    Special Requests BOTTLES DRAWN AEROBIC AND ANAEROBIC 10CC   Final    Culture  Setup Time 01/02/2012 18:57   Final    Culture     Final    Value:        BLOOD CULTURE RECEIVED NO GROWTH TO DATE CULTURE WILL BE HELD FOR 5 DAYS BEFORE ISSUING A FINAL NEGATIVE REPORT   Report Status PENDING   Incomplete   WOUND CULTURE     Status: Normal    Collection Time   01/02/12  4:31 PM      Component Value Range Status Comment   Specimen Description WOUND FOOT LEFT   Final    Special Requests Normal   Final    Gram Stain     Final    Value: NO WBC SEEN     ABUNDANT SQUAMOUS EPITHELIAL CELLS PRESENT     ABUNDANT GRAM POSITIVE COCCI     IN PAIRS IN CLUSTERS MODERATE GRAM POSITIVE RODS     FEW GRAM NEGATIVE RODS   Culture     Final    Value: MODERATE GROUP B STREP(S.AGALACTIAE)ISOLATED     Note: TESTING AGAINST S. AGALACTIAE NOT ROUTINELY PERFORMED DUE TO PREDICTABILITY OF AMP/PEN/VAN SUSCEPTIBILITY.   Report Status 01/04/2012 FINAL   Final   SURGICAL PCR SCREEN     Status: Abnormal   Collection Time   01/05/12 12:10 AM      Component Value Range Status Comment   MRSA, PCR NEGATIVE  NEGATIVE Final    Staphylococcus aureus POSITIVE (*) NEGATIVE Final      Imaging: No results found.    Medications:    Infusions:    . sodium chloride 150 mL/hr at 01/05/12 1605  . sodium chloride 125 mL/hr (01/06/12 0000)     Scheduled Medications:    . chlorhexidine  60 mL Topical Once  . insulin aspart  0-20 Units Subcutaneous TID WC  . insulin aspart  6 Units Subcutaneous TID WC  . insulin glargine  14 Units Subcutaneous Daily  . [COMPLETED] insulin glargine  4 Units Subcutaneous Once  . [COMPLETED] living well with diabetes book   Does not apply Once  . [COMPLETED] LORazepam  1 mg Oral Once  . [COMPLETED] ondansetron (ZOFRAN) IV  4 mg Intravenous Once  . piperacillin-tazobactam (ZOSYN)  IV  3.375 g Intravenous Q8H  . sodium chloride  1,000 mL Intravenous Once  . vancomycin  750 mg Intravenous Q12H  . [DISCONTINUED] enoxaparin (LOVENOX) injection  40 mg Subcutaneous Q24H  . [DISCONTINUED] insulin glargine  10 Units Subcutaneous Daily  . [DISCONTINUED] lisinopril  10 mg Oral Daily  . [DISCONTINUED] vancomycin  1,000 mg Intravenous Once  . [DISCONTINUED] vancomycin  1,000 mg Intravenous Q12H     PRN  Medications: acetaminophen, acetaminophen, HYDROcodone-acetaminophen    Assessment/ Plan:  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  1. Infected Lt foot ulcer- Continues to actively drain pus. No signs of systemic infection with resolved leukocytosis, afebrile, and blood cultures NGTD. 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 Orthopaedic surgery has been following the patient.  --Dr. Victorino Dike scheduled patient for BKA surgery for today at 2pm. We appreciate Orthopedic Surgery in their continued management  --Pt NPO in preparation for surgery  --Continue IV antibiotics for gram positive, gram negative and anaerobic coverage with Zosyn (day 4) and Vancomycin (day 4).  Pharmacy dosing. --Continue bedrest, non-weight-bearing status, wound care.  --Continue Tylenol/Vicodin for pain as needed  --Continue to follow Blood Cultures for 5 days. 2. Uncontrolled DM2: Hgb A1C 14.5 reflecting poor control secondary to financial constraints.  --Inadequate control, pateint seen by DM educator will follow recommendations.  Increase lantus to 20 units. Increase Novolog 8 units tid with meals. Continue RISS.  -- Patient unable to afford Lantus will change insulin management to 70/30 and adjust prior to discharge to increase compliance with therapy. 3. Hypertension: BP 123/70 this morning with lisinopril held. --Hold Lisinopril due to possible prerenal AKI. --Continue to monitor BP 4. Acute Kidney Injury: Baseline Cr ~1 in 2012. On admission Cr 1.96, BUN 39. FEna 0.34%. Uosmo 482, Urine Na is 23, consistent with a pre-renal etiology. . Creatine trending up to 2.18 yesterday, BUN decreased to 35.  Most likely prerenal secondary to dehydration from  hyperglycemia.   Blood glucose improving to 172 yesterday. --Optimize DM2 management --IVF NS @ 133ml/hr  --Hold lisinopril  --Continue to monitor  --Check BMet this AM. 5. Normocytic Anemia: Hgb is 9.9. The patient is asymptomatic. Iron decreased, TIBC decreased, Ferritin increased.  History and labs consistent with AOCD --Will consider transfusing if symptomatic or Hgb <7.0  6. HLD: Non-fastingTriglycerides 280, HDL 10, LDL 132. With DM2, hyperlipidemia patient is at high risk for CAD. Per ATP III guidelines target LDL<90.  -- Initiate low dose statin prior to discharge 7. Anxiety: Resolved. Pt given 1mg  Ativan yesterday. 8. Hyponatremia: resolved.  Na 136, Corrected Na 137.2.  9. Disposition: Orthopaedic surgery BKA today. SW consult for financial assistance with medications  10. DVT PPx- Lovenox  Code Status- Full       Length of Stay: 4 days   This is a Psychologist, occupational Note.  The care of the patient was discussed with Dr. Bosie Clos and the assessment and plan formulated with their assistance.  Please see their attached note or addendum for official documentation of the daily encounter.

## 2012-01-06 NOTE — Transfer of Care (Signed)
Immediate Anesthesia Transfer of Care Note  Patient: Austin Mcdaniel  Procedure(s) Performed: Procedure(s) (LRB) with comments: AMPUTATION BELOW KNEE (Left) - DR.HEWITT WOULD LIKE TO FOLLOW HIS OTHER AFTERNOON CASE STARTING AROUND 1700-1715  Patient Location: PACU  Anesthesia Type:Spinal  Level of Consciousness: awake, alert  and oriented  Airway & Oxygen Therapy: Patient Spontanous Breathing  Post-op Assessment: Report given to PACU RN and Post -op Vital signs reviewed and stable  Post vital signs: Reviewed and stable  Complications: No apparent anesthesia complications

## 2012-01-06 NOTE — Preoperative (Signed)
Beta Blockers   Reason not to administer Beta Blockers:Not Applicable 

## 2012-01-06 NOTE — Progress Notes (Signed)
Anesthesia PACU note---Sensory level at T 8.

## 2012-01-06 NOTE — Progress Notes (Signed)
Resident Addendum to Medical Student Note   I have seen and examined the patient, and agree with the the medical student assessment and plan outlined above. Please see my brief note below for additional details.  S: No acute events overnight   OBJECTIVE: VS: Reviewed  Meds: Reviewed  Labs: Reviewed  Imaging: Reviewed   Physical Exam: General: Well-developed, well-nourished, in no acute distress; alert, appropriate and cooperative throughout examination.  Lungs:  Normal respiratory effort. Clear to auscultation BL without crackles or wheezes.  Heart: RRR. S1 and S2 normal without gallop, murmur, or rubs appreciated  Extremities: LLE foot bandaged with gauze, yellow drainage noted     ASSESSMENT/ PLAN: Pt is a 41 y.o. yo male with a PMHx of poorly controlled diabetes and diabetic foot ulcers s/p prior toe amputations who was admitted on 01/02/2012 with symptoms of left foot pain, swelling, and drainage.  1. Diabetic Foot ulcer: MRI w/ evidence of osteomyelitis, wound culture with gm pos cocci and moderate gm pos rods, on Day #4 Vanc and Zosyn, remains afebrile with resolved leukocytosis, Ortho following planning for OR today -cont broad spectrum Antibx -cont Wound Care -cont trend leukocytosis -blood cx pending   Lab 01/06/12 0830 01/05/12 1909 01/05/12 1155 01/04/12 0617 01/03/12 0650  WBC 7.3 7.8 6.0 8.4 8.1   2. Diabetes Mellitus Type 2: poorly controlled secondary to noncompliance with meds (financial), improvement in cbg on basal insulin and SSI with meal coverage, DME consulted, will plan for transition to 70/30 insulin before discharge -cont SSI resistant -increase to Lantus 20 units -increase meal coverage to 8 units Novolog  CBG (last 3)   Basename 01/06/12 0644 01/05/12 2143 01/05/12 1613  GLUCAP 243* 185* 110*    3. Acute Kidney Injury: FENa <1%, NPO overnight with continued hyperglycemia, creatinine continues to slightly rise even with IVF -cont NS IV fluids to  150cc/h -cont hold lisinopril -cont to monitor creatinine   Lab 01/06/12 0830 01/05/12 1909 01/05/12 1155 01/04/12 0617 01/03/12 0650  CREATININE 1.93* 2.18* 2.06* 2.03* 1.99*   4. Hypertension: above goal, pt discharged last year on lisinopril 10 mg qd but noncompliant due to financial concerns -hold lisinopril in setting of AKI -monitor bp   5. Disposition: case manager consulted for financial assistance with medications, Social worker to assist with financial counseling assistance  Length of Stay: 4   Kristie Cowman, MD PGY2, Internal Medicine Resident 01/06/2012, 12:08 PM

## 2012-01-06 NOTE — Progress Notes (Signed)
Sensory level at C 4

## 2012-01-07 ENCOUNTER — Encounter (HOSPITAL_COMMUNITY): Payer: Self-pay | Admitting: Orthopedic Surgery

## 2012-01-07 DIAGNOSIS — E119 Type 2 diabetes mellitus without complications: Secondary | ICD-10-CM

## 2012-01-07 DIAGNOSIS — S88119A Complete traumatic amputation at level between knee and ankle, unspecified lower leg, initial encounter: Secondary | ICD-10-CM

## 2012-01-07 LAB — CBC
MCH: 29.9 pg (ref 26.0–34.0)
MCHC: 34.3 g/dL (ref 30.0–36.0)
MCV: 87.1 fL (ref 78.0–100.0)
Platelets: 384 10*3/uL (ref 150–400)
RDW: 12 % (ref 11.5–15.5)

## 2012-01-07 LAB — GLUCOSE, CAPILLARY
Glucose-Capillary: 201 mg/dL — ABNORMAL HIGH (ref 70–99)
Glucose-Capillary: 161 mg/dL — ABNORMAL HIGH (ref 70–99)
Glucose-Capillary: 145 mg/dL — ABNORMAL HIGH (ref 70–99)

## 2012-01-07 LAB — BASIC METABOLIC PANEL
BUN: 21 mg/dL (ref 6–23)
CO2: 25 mEq/L (ref 19–32)
Calcium: 8.8 mg/dL (ref 8.4–10.5)
Creatinine, Ser: 1.62 mg/dL — ABNORMAL HIGH (ref 0.50–1.35)
GFR calc non Af Amer: 51 mL/min — ABNORMAL LOW (ref >90)
Glucose, Bld: 210 mg/dL — ABNORMAL HIGH (ref 70–99)
Sodium: 135 mEq/L (ref 135–145)

## 2012-01-07 LAB — FOLATE: Folate: 9.5 ng/mL (ref >5.4)

## 2012-01-07 MED ORDER — SIMVASTATIN 10 MG PO TABS
10.0000 mg | ORAL_TABLET | Freq: Every day | ORAL | Status: DC
  Administered 2012-01-07: 10 mg via ORAL
  Filled 2012-01-07 (×2): qty 1

## 2012-01-07 MED ORDER — DOCUSATE SODIUM 100 MG PO CAPS: 100.0000 mg | ORAL_CAPSULE | Freq: Two times a day (BID) | ORAL | Status: DC

## 2012-01-07 MED ORDER — ASPIRIN EC 325 MG PO TBEC: 325.0000 mg | DELAYED_RELEASE_TABLET | Freq: Every day | ORAL | Status: DC

## 2012-01-07 MED ORDER — SENNOSIDES 8.6 MG PO TABS: 2.0000 | ORAL_TABLET | Freq: Every day | ORAL | Status: DC

## 2012-01-07 MED ORDER — OXYCODONE HCL 5 MG PO TABS: 5.0000 mg | ORAL_TABLET | ORAL | Status: DC | PRN

## 2012-01-07 MED ORDER — LISINOPRIL 5 MG PO TABS
5.0000 mg | ORAL_TABLET | Freq: Every day | ORAL | Status: DC
  Administered 2012-01-08: 5 mg via ORAL
  Filled 2012-01-07 (×2): qty 1

## 2012-01-07 MED ORDER — HYDROMORPHONE HCL PF 1 MG/ML IJ SOLN
1.0000 mg | INTRAMUSCULAR | Status: DC | PRN
  Administered 2012-01-07 – 2012-01-08 (×3): 1 mg via INTRAVENOUS
  Filled 2012-01-07 (×4): qty 1

## 2012-01-07 MED ORDER — CITALOPRAM HYDROBROMIDE 10 MG PO TABS
10.0000 mg | ORAL_TABLET | Freq: Every day | ORAL | Status: DC
  Administered 2012-01-07 – 2012-01-08 (×2): 10 mg via ORAL
  Filled 2012-01-07 (×2): qty 1

## 2012-01-07 NOTE — Progress Notes (Signed)
Utilization review completed. Bayli Quesinberry, RN, BSN. 

## 2012-01-07 NOTE — Progress Notes (Signed)
Rehab Admissions Coordinator Note:  Patient was screened by Brock Ra for appropriateness for an Inpatient Acute Rehab Consult.  At this time, we are recommending Inpatient Rehab consult.  Brock Ra 01/07/2012, 12:04 PM  I can be reached at [phone].

## 2012-01-07 NOTE — Progress Notes (Signed)
Subjective: 1 Day Post-Op Procedure(s) (LRB): AMPUTATION BELOW KNEE (Left) Patient reports pain as mild.  Says his leg feels "sore".  Rough night last night but did well with PT and OT today.  Objective: Vital signs in last 24 hours: Temp:  [98.2 F (36.8 C)-99 F (37.2 C)] 98.2 F (36.8 C) (11/13 1400) Pulse Rate:  [79-108] 79  (11/13 1400) Resp:  [16-18] 18  (11/13 1400) BP: (148-173)/(79-88) 173/87 mmHg (11/13 1400) SpO2:  [95 %-98 %] 95 % (11/13 1400)  Intake/Output from previous day: 11/12 0701 - 11/13 0700 In: 1900 [P.O.:100; I.V.:1400; IV Piggyback:400] Out: 1295 [Urine:1275; Blood:20] Intake/Output this shift:     Basename 01/07/12 1147 01/06/12 0830 01/05/12 1909 01/05/12 1155  HGB 9.5* 9.5* 9.9* 9.0*    Basename 01/07/12 1147 01/06/12 0830  WBC 10.5 7.3  RBC 3.18* 3.20*  HCT 27.7* 27.6*  PLT 384 325    Basename 01/07/12 1147 01/06/12 0830  NA 135 136  K 4.4 5.0  CL 100 102  CO2 25 26  BUN 21 35*  CREATININE 1.62* 1.93*  GLUCOSE 210* 249*  CALCIUM 8.8 8.6   No results found for this basename: LABPT:2,INR:2 in the last 72 hours  L LE dressed and dry.  Knee immobilizer in place.  Assessment/Plan: 1 Day Post-Op Procedure(s) (LRB): AMPUTATION BELOW KNEE (Left) Up with therapy  OK to d/c abx since amputation was through healthy appearing tissue.  Pt can be discharged at any time from my perspective.  Toni Arthurs 01/07/2012, 8:01 PM

## 2012-01-07 NOTE — Progress Notes (Signed)
Medical Student Daily Progress Note   Subjective:    Interval Events:  Patient underwent BKA yesterday with Dr. Victorino Dike.  Patient reports pain in the left lower leg that responds to PRN pain medication.  Admits some intermittent anxiety.  Denies any N/V, loose stools.  Eating and drinking without issues.      Objective:    Vital Signs:   Temp:  [97.1 F (36.2 C)-99 F (37.2 C)] 99 F (37.2 C) (11/13 0540) Pulse Rate:  [56-108] 108  (11/13 0540) Resp:  [12-20] 16  (11/13 0540) BP: (148-181)/(79-88) 169/88 mmHg (11/13 0540) SpO2:  [91 %-98 %] 98 % (11/13 0540) Last BM Date: 01/06/12   Weights: 24-hour Weight change:   Filed Weights   01/02/12 1700  Weight: 102.059 kg (225 lb)      Intake/Output:   Intake/Output Summary (Last 24 hours) at 01/07/12 0956 Last data filed at 01/07/12 0539  Gross per 24 hour  Intake   1750 ml  Output   1295 ml  Net    455 ml        Physical Exam: GENERAL:  alert and oriented; no acute distress EYES:  pupils equal, round, and reactive to light; sclera anicteric ENT:  moist mucosa LUNGS:  clear to auscultation bilaterally, normal work of breathing HEART:  normal rate; regular rhythm; normal S1 and S2, no S3 or S4 appreciated; no murmurs, rubs, or clicks ABDOMEN:  soft, non-tender, normal bowel sounds, no masses palpated EXTREMITIES:  Left lower extremity BKA, wrapped in bandage and overlying brace. SKIN:  normal turgor    Labs: Basic Metabolic Panel:  Lab 01/06/12 1610 01/05/12 1909 01/05/12 1155 01/04/12 0617 01/03/12 0650  NA 136 136 134* 132* 130*  K 5.0 4.4 4.1 3.7 3.8  CL 102 98 97 95* 90*  CO2 26 30 30 26 25   GLUCOSE 249* 172* 243* 268* 367*  BUN 35* 35* 35* 41* 44*  CREATININE 1.93* 2.18* 2.06* 2.03* 1.99*  CALCIUM 8.6 8.9 8.9 -- --  MG -- -- -- -- --  PHOS -- -- -- -- --    Liver Function Tests:  Lab 01/02/12 1225  AST 8  ALT 8  ALKPHOS 89  BILITOT 0.2*  PROT 7.7  ALBUMIN 2.6*   No results found for this  basename: LIPASE:5,AMYLASE:5 in the last 168 hours No results found for this basename: AMMONIA:3 in the last 168 hours  CBC:  Lab 01/06/12 0830 01/05/12 1909 01/05/12 1155 01/04/12 0617 01/03/12 0650 01/02/12 1225  WBC 7.3 7.8 6.0 8.4 8.1 --  NEUTROABS -- -- -- -- -- 9.1*  HGB 9.5* 9.9* 9.0* 9.8* 11.1* --  HCT 27.6* 28.6* 25.9* 28.6* 31.2* --  MCV 86.3 86.1 86.0 83.6 83.2 --  PLT 325 356 310 320 328 --    Cardiac Enzymes: No results found for this basename: CKTOTAL:5,CKMB:5,CKMBINDEX:5,TROPONINI:5 in the last 168 hours  BNP (last 3 results): No results found for this basename: PROBNP:3 in the last 8760 hours  CBG:  Lab 01/07/12 0639 01/06/12 2145 01/06/12 1658 01/06/12 1328 01/06/12 1144  GLUCAP 146* 153* 120* 165* 188*    Coagulation Studies: No results found for this basename: LABPROT:5,INR:5 in the last 72 hours  Microbiology: Results for orders placed during the hospital encounter of 01/02/12  CULTURE, BLOOD (ROUTINE X 2)     Status: Normal (Preliminary result)   Collection Time   01/02/12 12:33 PM      Component Value Range Status Comment   Specimen Description BLOOD RIGHT  ANTECUBITAL   Final    Special Requests BOTTLES DRAWN AEROBIC AND ANAEROBIC 10CC   Final    Culture  Setup Time 01/02/2012 18:57   Final    Culture     Final    Value:        BLOOD CULTURE RECEIVED NO GROWTH TO DATE CULTURE WILL BE HELD FOR 5 DAYS BEFORE ISSUING A FINAL NEGATIVE REPORT   Report Status PENDING   Incomplete   CULTURE, BLOOD (ROUTINE X 2)     Status: Normal (Preliminary result)   Collection Time   01/02/12 12:38 PM      Component Value Range Status Comment   Specimen Description BLOOD LEFT ANTECUBITAL   Final    Special Requests BOTTLES DRAWN AEROBIC AND ANAEROBIC 10CC   Final    Culture  Setup Time 01/02/2012 18:57   Final    Culture     Final    Value:        BLOOD CULTURE RECEIVED NO GROWTH TO DATE CULTURE WILL BE HELD FOR 5 DAYS BEFORE ISSUING A FINAL NEGATIVE REPORT   Report  Status PENDING   Incomplete   WOUND CULTURE     Status: Normal   Collection Time   01/02/12  4:31 PM      Component Value Range Status Comment   Specimen Description WOUND FOOT LEFT   Final    Special Requests Normal   Final    Gram Stain     Final    Value: NO WBC SEEN     ABUNDANT SQUAMOUS EPITHELIAL CELLS PRESENT     ABUNDANT GRAM POSITIVE COCCI     IN PAIRS IN CLUSTERS MODERATE GRAM POSITIVE RODS     FEW GRAM NEGATIVE RODS   Culture     Final    Value: MODERATE GROUP B STREP(S.AGALACTIAE)ISOLATED     Note: TESTING AGAINST S. AGALACTIAE NOT ROUTINELY PERFORMED DUE TO PREDICTABILITY OF AMP/PEN/VAN SUSCEPTIBILITY.   Report Status 01/04/2012 FINAL   Final   SURGICAL PCR SCREEN     Status: Abnormal   Collection Time   01/05/12 12:10 AM      Component Value Range Status Comment   MRSA, PCR NEGATIVE  NEGATIVE Final    Staphylococcus aureus POSITIVE (*) NEGATIVE Final      Imaging: Dg Chest 2 View  01/02/2012 *RADIOLOGY REPORT* Clinical Data: Fever, diabetes CHEST - 2 VIEW Comparison: None. Findings: Cardiomediastinal silhouette is unremarkable. No acute infiltrate or pleural effusion. No pulmonary edema. Bony thorax is unremarkable. IMPRESSION: No active disease. Original Report Authenticated By: Natasha Mead, M.D.  Dg Ankle Complete Left  01/02/2012 *RADIOLOGY REPORT* Clinical Data: Ankle region soft tissue ulcer LEFT ANKLE COMPLETE - 3+ VIEW Comparison: None. Findings: Frontal, lateral, and oblique views were obtained. There is no fracture or effusion. Ankle mortise appears intact. 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 No erosive change or bony destruction. No soft tissue abscess seen. IMPRESSION: Osteoarthritic change. No fracture or joint effusion. No bony destruction. No soft tissue abscess appreciated. Original Report Authenticated By: Bretta Bang, M.D.  Mr Foot Left W Wo Contrast  01/02/2012 *RADIOLOGY REPORT* Clinical Data: Blistering bottom of foot. Prior  amputations. Diabetes. MRI OF THE LEFT FOREFOOT WITHOUT AND WITH CONTRAST Technique: Multiplanar, multisequence MR imaging was performed both before and after administration of intravenous contrast. Contrast: 20mL MULTIHANCE GADOBENATE DIMEGLUMINE 529 MG/ML IV SOLN Comparison: 01/02/2012 radiographs Findings: Prior amputations of the first and second toes at the MTP joints observed. Extensive  irregular abscess noted dorsal and distal to the first and second metatarsals, with abnormal but low-level edema in the second metatarsal favoring chronic osteomyelitis, and deformity the head of the second metatarsal. There is deformity, irregularity, and erosion of the heads of the third and fourth metatarsals which could be from the erosive arthropathy or fracture, but with osteomyelitis also considered a possibility. However, there is little in the way of edema in the adjacent third and fourth metatarsal shafts, and no phalangeal osteomyelitis is observed. As expected, there is extensive surrounding infiltrative subcutaneous edema and enhancement particularly around the abscesses but also tracking in the dorsum of the foot. Gas tracks within the dorsal abscess and within the distal portions of the abscess, and I suspect draining sinus tracts along the amputation bed of the first and second toes. On image 29 of series 10, draining sinus tract to the medial ball of the foot is also suggested. Lisfranc ligament is intact. Visualized midfoot structures intact. Edema and enhancement tracks along the plantar musculature of the foot. IMPRESSION: 1. Considerable geographic abscess dorsal to the first and second metatarsals, tracking in the thickened and enhancing soft tissues, and also extending distal to the head of the first and second metatarsals. There is probably some drainage along the amputation bed, and there is considerable gas within the abscess. 2. Low-level edema and enhancement in the second metatarsal, favoring  low-level osteomyelitis. 3. Deformities and erosions of the heads of the third and fourth metatarsals, chronic since June 2013, potentially related to osteomyelitis, the erosive arthropathy, or prior fractures. However, there is not currently significant edema tracks in the shaft of the third and fourth metatarsals. 4. Extensive regional cellulitis, especially dorsally. 5. Edema and enhancement tracking along the plantar musculature of the foot, compatible with myositis/fasciitis Original Report Authenticated By: Gaylyn Rong, M.D.  Dg Foot Complete Left  01/02/2012 *RADIOLOGY REPORT* Clinical Data: Left foot ulcer. LEFT FOOT - COMPLETE 3+ VIEW Comparison: July 28, 2011. Findings: Status post amputation of first and second toes. Chronic changes of the distal third and fourth metatarsals are noted which are unchanged compared to prior exam. These most likely due to prior osteomyelitis. Swelling and gas is noted in the dorsal soft tissues overlying the metatarsals consistent with infection. IMPRESSION: Findings consistent with cellulitis or ulcer involving the dorsal soft tissues. No definite evidence of acute osteomyelitis is noted. Original Report Authenticated By: Lupita Raider., M.D.     Medications:    Infusions:    . sodium chloride 150 mL/hr at 01/06/12 0856  . [DISCONTINUED] sodium chloride 125 mL/hr (01/06/12 0000)  . [DISCONTINUED] lactated ringers       Scheduled Medications:    . [EXPIRED] acetaminophen      . [COMPLETED] chlorhexidine  60 mL Topical Once  . docusate sodium  100 mg Oral BID  . enoxaparin (LOVENOX) injection  40 mg Subcutaneous Q24H  . insulin aspart  0-20 Units Subcutaneous TID WC  . insulin aspart  8 Units Subcutaneous TID WC  . insulin glargine  14 Units Subcutaneous Daily  . piperacillin-tazobactam (ZOSYN)  IV  3.375 g Intravenous Q8H  . senna  1 tablet Oral BID  . vancomycin  750 mg Intravenous Q12H  . [DISCONTINUED] sodium chloride  1,000 mL  Intravenous Once     PRN Medications: acetaminophen, acetaminophen, metoCLOPramide (REGLAN) injection, metoCLOPramide, morphine injection, ondansetron (ZOFRAN) IV, ondansetron, oxyCODONE, [DISCONTINUED] 0.9 % irrigation (POUR BTL), [DISCONTINUED] bacitracin, [DISCONTINUED] HYDROcodone-acetaminophen, [DISCONTINUED]  HYDROmorphone (DILAUDID) injection, [DISCONTINUED]  HYDROmorphone (DILAUDID)  injection, [DISCONTINUED] oxyCODONE, [DISCONTINUED] oxyCODONE, [DISCONTINUED] oxyCODONE    Assessment/ Plan:    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   1. Infected Lt foot ulcer Status post below knee amputation:  Patient doing well POD #1.  Afebrile.  Pain controlled with PRN pain medications.  Blood cultures negative to date. --Discuss discontinuation of antibiotics with Orthopaedic surgery --Continue wound care per Ortho.  --Continue Tylenol/Oxycodone for pain as needed  --Continue to follow Blood Cultures -- Inpatient rehab to evaluate. 2. Uncontrolled DM2: Hgb A1C 14.5 reflecting poor control secondary to financial constraints.  --Improved control on Lantus 14units, Novolog 8 units tid with meals.  Will continue with this regiment for now and consider switching to 70/30 tomorrow in preparation for discharge. 3. Hypertension: Systolic blood pressure has been elevated in 160-180 range. --Restart Lisinopril 5mg  and monitor creatine closely 4. Acute Kidney Injury: Baseline Cr ~1 in 2012. On admission Cr 1.96, BUN 39. FEna 0.34%. Uosmo 482. Cre and BUN have increased with Cre 2.03 BUN 41 Most likely prerenal secondary to dehydration from continued hyperglycemia and NPO status since midnight. Urine Na is 23, consistent with a pre-renal etiology. Creatine today has decrease to 1.63, BUN has decreased to 21. --Optimize DM2 management    --IVF NS @ 128ml/hr  --Continue to monitor, pt likely has component of CKD from uncontrolled DM and HTN. 5. Anxiety:  Has a previous history of Anxiety for which he took Lexapro 12 years ago.  Patient reports he has had increased anxiety lately --Will Consult inpatient Psychiatry to evaluate and give recommendations on treatment. 6. HLD: Non-fastingTriglycerides 280, HDL 10, LDL 132. With DM2, hyperlipidemia patient is at high risk for CAD. Per ATP III guidelines target LDL<90.  --Start Simvastatin 10mg  8. Normocytic Anemia: Hgb is 9.5. The patient is asymptomatic. Iron decreased, TIBC decreased, Ferritin increased. History and labs consistent with AOCI  --Will consider transfusing if symptomatic or Hgb <7.0  9. Disposition: Inpatient rehab consult. 10. DVT PPx- Lovenox, SCDs Code Status- Full       Length of Stay: 5 days   This is a Psychologist, occupational Note.  The care of the patient was discussed with Dr. Sherrine Maples and the assessment and plan formulated with their assistance.  Please see their attached note or addendum for official documentation of the daily encounter.

## 2012-01-07 NOTE — Op Note (Signed)
NAME:  KYRA, SIGONA NO.:  [id number]  MEDICAL RECORD NO.:  [id number]  LOCATION:  [id number]                        FACILITY:  [facility]  PHYSICIAN:  Toni Arthurs, MD        DATE OF BIRTH:  [birth year]  DATE OF PROCEDURE:  01/06/2012 DATE OF DISCHARGE:                              OPERATIVE REPORT   PREOPERATIVE DIAGNOSIS:  Left foot gangrene.  POSTOPERATIVE DIAGNOSIS:  Left foot gangrene.  PROCEDURE:  Left below-knee amputation.  SURGEON:  Toni Arthurs, MD  ANESTHESIA:  Spinal.  ESTIMATED BLOOD LOSS:  Minimal.  TOURNIQUET TIME:  37 minutes at 225 mmHg.  COMPLICATIONS:  None apparent.  DISPOSITION:  Extubated, awake, and stable to recovery.  INDICATION FOR PROCEDURE:  The patient is a 41 year old male with uncontrolled diabetes.  He has a history of left first and second toe amputations.  He presented to the emergency department last Friday with a necrotic left foot.  He was started on IV vancomycin and Zosyn.  He presents now for left below-knee amputation.  There is insufficient skin to allow for successful amputation distal to that site.  He understands the risks and benefits, the alternative treatment options and elects surgical treatment.  He specifically understands risks of bleeding, infection, nerve damage, blood clots, need for additional surgery, revision amputation, and death.  PROCEDURE IN DETAIL:  After preoperative consent was obtained and the correct operative site was identified, the patient was brought to the operating room and placed supine on the operating table.  Spinal anesthesia was administered.  Left lower extremity was prepped and draped in standard sterile fashion with tourniquet around the thigh. The extremity was elevated for approximately a minute and the tourniquet was inflated to 225 mmHg.  A posterior flap incision was marked on the skin 15 cm distal from the medial joint line of the knee.  The previously marked incision was  made and sharp dissection was carried down through the skin and subcutaneous tissue.  The periosteum of the tibia was incised approximately a cm proximal from the skin incision. The periosteum was elevated circumferentially and reciprocating saw was then used to cut through the tibia, taking care to protect the surrounding soft tissue.  Fibula was identified and dissected free.  The fibula was cut with the reciprocating saw approximately 2 cm proximal from the tibial cut.  An amputation knife was then used to cut posterior to the tibia and fibula, contouring the posterior flap appropriately and removing the distal portion of the leg in its entirety.  The leg was passed off the field as a specimen to Pathology.  The cut edges of the tibia and fibula were beveled and smoothed with a rasp.  The named blood vessels were all doubly suture ligated with 2-0 silk suture.  All the named nerves were divided on traction with a #10 blade and allowed to retract into the soft tissues.  The gastrocnemius tendon was then repaired to the anterior periosteum of the tibia using imbricating sutures of 0 PDS.  The posterior fascia was then repaired to the anterior fascia using inverted simple sutures of 3-0 Monocryl.  The skin was closed with staples.  Sterile  dressings were applied followed by a bulky compression dressing and a knee immobilizer.  The tourniquet was released at 37 minutes after application of the dressings.  The patient was then awakened from anesthesia and transported to the recovery room in stable condition.  FOLLOWUP PLAN:  The patient will be readmitted to the medicine service. He will be nonweightbearing on the left lower extremity.  He will have physical therapy and follow up with me in 3 weeks for staple removal.     Toni Arthurs, MD     JH/MEDQ  D:  01/06/2012  T:  01/07/2012  Job:  [id number]

## 2012-01-07 NOTE — Progress Notes (Signed)
Resident Addendum to Medical Student Note   I have seen and examined the patient, and agree with the the medical student assessment and plan outlined above. Please see my brief note below for additional details.  S: POD 1 s/p left BKA. C/o some pain at amputation site.   OBJECTIVE: VS: Reviewed  Meds: Reviewed  Labs: Reviewed  Imaging: Reviewed   Physical Exam: General: Well-developed, well-nourished, in no acute distress; alert, appropriate and cooperative throughout examination.  Lungs:  Normal respiratory effort. Clear to auscultation BL without crackles or wheezes.  Heart: RRR. S1 and S2 normal without gallop, murmur, or rubs appreciated  Extremities: L BKA with ace bandages under lg black foam brace     ASSESSMENT/ PLAN: Pt is a 41 y.o. yo male with a PMHx of poorly controlled diabetes and diabetic foot ulcers s/p prior toe amputations who was admitted on 01/02/2012 with symptoms of left foot pain, swelling, and drainage.  1. Left BKA: POD #1 s/p left BKA by Orthopedics 2/2 diabetic foot ulcer. MRI on 11/8 w/ evidence of osteomyelitis, wound culture with gm pos cocci and moderate gm pos rods, on Day #5 Vanc and Zosyn, remains afebrile with resolved leukocytosis. Will discuss abx with Ortho. - am CBC - F/u Bcx- neg to date - F/u w/ inpatient rehab  Lab 01/07/12 1147 01/06/12 0830 01/05/12 1909 01/05/12 1155 01/04/12 0617  WBC 10.5 7.3 7.8 6.0 8.4   2. Diabetes Mellitus Type 2: poorly controlled secondary to noncompliance with meds (financial), improvement in cbg on basal insulin and SSI with meal coverage, DME consulted, will plan for transition to 70/30 insulin before discharge - Continue SSI resistant - Continue Lantus 14 units - Continue meal coverage to 8 units Novolog CBG (last 3)   Basename 01/07/12 1103 01/07/12 0639 01/06/12 2145  GLUCAP 201* 146* 153*    3. Acute Kidney Injury: FENa <1%, NPO overnight with continued hyperglycemia, creatinine slowly improving, so  starting ACEi at low dose. - Lisinopril 5mg  daily -cont NS IV fluids to 150cc/h -cont to monitor creatinine   Lab 01/07/12 1147 01/06/12 0830 01/05/12 1909 01/05/12 1155 01/04/12 0617  CREATININE 1.62* 1.93* 2.18* 2.06* 2.03*   4. Hypertension: above goal, pt discharged last year on lisinopril 10 mg qd but noncompliant due to financial concerns. Restarting Lisinopril at 5mg  daily. - Lisinopril 5mg  daily - monitor bp   5. Anxiety: Endorses some anxiety post op. Will consult Psych and start low dose Citalopram.  6. Disposition: Case manager consulted for financial assistance with medications, Social worker to assist with financial counseling assistance.  Length of Stay: 5   Kristie Cowman, MD PGY2, Internal Medicine Resident 01/07/2012, 3:29 PM

## 2012-01-07 NOTE — Progress Notes (Signed)
[facility]  [facility]    SUBJECTIVE:     Austin Mcdaniel is a 41 year old male with DM II, HTN, hypothyroidism and Hx of aortocoronary bypass graft who presents to clinic for F/U.     HPI:   1. HTN   Amlodipine (5 MG) , losartan (50 MG) , metoprolol (50 MG) daily   Blood pressure normally 130/70 at home. Pt reports occ L side chest pain at rest (5 out of 10 ) for 12 years.  Does not use nitroglycerin because says pain is not that bad. No headaches, no palpitations.     2. DM   Insulin 30 units at night, Metformin BID, Jardiance 1x day   No increase thirst , dry mouth, no blurred vision , headaches   Blood sugar fasting 110    Blood sugar post- prandial 180-200     Patient Active Problem List   Diagnosis   . Atherosclerosis of coronary artery   . Hypercholesterolemia   . Essential hypertension   . Type 2 diabetes mellitus with microalbuminuria, with long-term current use of insulin (CMS-HCC)   . Status post coronary artery bypass graft   . Healthcare maintenance   . Acquired hypothyroidism   . Microcytic anemia   . Adenomatous polyp of colon, unspecified part of colon   . Benign prostatic hyperplasia with weak urinary stream   . Chronic bilateral low back pain without sciatica   . Keloid   . Chronic pain of both knees       No past surgical history on file.     No family history on file.    Social History     Socioeconomic History   . Marital status: Married   Tobacco Use   . Smoking status: Never   . Smokeless tobacco: Never   Substance and Sexual Activity   . Alcohol use: No   . Drug use: No       Current Outpatient Medications   Medication Instructions   . amLODIPINE (NORVASC) 5 mg, Oral, DAILY   . atorvastatin (LIPITOR) 40 mg, Oral, AT BEDTIME   . BD VEO INSULIN SYRINGE U/F 31G X 15/64" 1 ML MISC USE 1 SYRINGE 4 TIMES DAILY BEFORE MEAL(S) AND AT NIGHT   . Blood Glucose Monitoring Suppl (TRUETRACK BLOOD GLUCOSE) w/Device KIT Please give blood glucose machine with test strips and  lancets   . EQ ASPIRIN ADULT LOW DOSE 81 MG EC tablet Take 1 tablet by mouth once daily   . ferrous sulfate 325 (65 Fe) MG tablet Take 1 tablet by mouth twice daily   . glucose blood test strip 1 strip, Other, 3 TIMES DAILY BEFORE MEALS   . Insulin Pen Needle (PEN NEEDLES 3/16") 31G X 5 MM MISC Use one pen needle with each insulin administration.   . JARDIANCE 10 MG tablet Take 1 tablet by mouth once daily   . lancets 1 each, Other, 3 TIMES DAILY BEFORE MEALS   . LANTUS SOLOSTAR 100 UNIT/ML SOPN INJECT 30 UNITS SUBCUTANEOUSLY AT BEDTIME   . levothyroxine (SYNTHROID) 50 mcg, Oral, DAILY BEFORE BREAKFAST   . losartan (COZAAR) 50 mg, Oral, DAILY   . metFORMIN (GLUCOPHAGE) 1000 mg tablet TAKE 1 TABLET BY MOUTH TWICE DAILY WITH MEALS   . metoprolol succinate (TOPROL XL) 50 mg, Oral, DAILY   . nitroGLYcerin (NITROSTAT) 0.4 mg, Sublingual, EVERY 5 MIN PRN   . omeprazole (PRILOSEC) 20 mg, Oral, DAILY   .   potassium citrate (UROCIT-K) 10 MEQ (1080 MG) Sustained-Release tablet 10 mEq, Oral, 3 TIMES DAILY   . tamsulosin (FLOMAX) 0.4 mg, Oral, DAILY   . U-100 1 mL U-100 1 ML syringe 1 Syringe, Other, 4 TIMES DAILY BEFORE MEALS & NIGHTLY       No Known Allergies    OBJECTIVE:   BP 152/68   Pulse 72   Temp 98 F (36.7 C) (Temporal)   Resp 18   Ht 5' 10" (1.778 m)   Wt 70.5 kg (155 lb 7.1 oz)   BMI 22.30 kg/m   Chaperone: ***  General: No acute distress.  Heart: RRR, no murmurs   Chest: CTAB, no rales, ronchi   Extremities: No peripheral edema      Labs and Imaging:      ASSESSMENT & PLAN:     1. Stable Angina Pectoris   # {Healthcare Maintenance}  # {Body mass index is 22.3 kg/m.}  # {Nutrition and Exercise Counseling}  {Interventions done today to address healthcare maintenance, e.g. vaccines, cancer screening, screening tests}  - {Intervention #1}  - {Intervention #2}    Follow Up: No follow-ups on file.    Discussed the importance of monitoring labs as well as medication compliance and the risks of significant  morbidity and mortality if not done  Patient was instructed to call the clinic if has not heard back in regards to diagnostic tests within 1 week.  Discussed healthy diet and exercise  Patient was given strict return precautions should symptoms worsen or fail to improve for any concerns.    Alexis Pellecer, MS-3   [employer]

## 2012-01-07 NOTE — Evaluation (Signed)
Physical Therapy Evaluation Patient Details Name: Austin Mcdaniel MRN: [id number] DOB: 1970/03/07 Today's Date: 01/07/2012 Time: 2130-8657 PT Time Calculation (min): 29 min  PT Assessment / Plan / Recommendation Clinical Impression  Pt is a 41 y/o male s/p L BKA.  Pt would benefit from CIR consutl to maximize functional mobility and prepare pt and L residual limb for eventual fitting and use of prosthesis.  Acute PT will continue to follow pt to initiate and progress rehab process.      PT Assessment  Patient needs continued PT services    Follow Up Recommendations  CIR    Does the patient have the potential to tolerate intense rehabilitation      Barriers to Discharge None      Equipment Recommendations  Wheelchair (measurements);Tub/shower seat;Rolling walker with 5" wheels    Recommendations for Other Services Rehab consult   Frequency Min 5X/week    Precautions / Restrictions Precautions Precautions: Fall Restrictions Weight Bearing Restrictions: No   Pertinent Vitals/Pain Pt c/o 6-8/10 pain in L residual limb. Pt medicated 1 hour prior to PT eval (per pt).  Elevated L residual limb and notified RN of pt pain at end of session.         Mobility  Bed Mobility Bed Mobility: Sit to Supine Supine to Sit: Not tested (comment) Sit to Supine: 5: Supervision Details for Bed Mobility Assistance: Cueing for technique  Transfers Transfers: Sit to Stand;Stand to Sit;Stand Pivot Transfers Sit to Stand: 4: Min assist;With upper extremity assist;From chair/3-in-1;With armrests Stand to Sit: 4: Min guard;To bed;With upper extremity assist Stand Pivot Transfers: 4: Min guard Details for Transfer Assistance: Instructed pt in proper hand placement and technique for sit <--> stand and stand pivot transfers NWB on L LE.  Assist to steady pt to stand.   Ambulation/Gait Ambulation/Gait Assistance: 4: Min guard Ambulation Distance (Feet): 10 Feet Assistive device: Rolling  walker Ambulation/Gait Assistance Details: Min guard for safety.  Gait Pattern: Step-to pattern General Gait Details: Pt's c/o UE fatigue Stairs: No Wheelchair Mobility Wheelchair Mobility: No    Shoulder Instructions     Exercises Amputee Exercises Quad Sets: 5 reps;Left;AROM Hip Flexion/Marching: AROM;Left;10 reps;Supine   PT Diagnosis: Difficulty walking;Acute pain  PT Problem List: Decreased activity tolerance;Decreased mobility;Decreased knowledge of use of DME;Decreased safety awareness;Decreased knowledge of precautions;Pain PT Treatment Interventions: DME instruction;Stair training;Gait training;Functional mobility training;Therapeutic activities;Therapeutic exercise;Patient/family education;Wheelchair mobility training   PT Goals Acute Rehab PT Goals PT Goal Formulation: With patient Time For Goal Achievement: 01/14/12 Potential to Achieve Goals: Good Pt will Transfer Bed to Chair/Chair to Bed: with modified independence PT Transfer Goal: Bed to Chair/Chair to Bed - Progress: Goal set today Pt will Ambulate: 16 - 50 feet;with modified independence;with rolling walker PT Goal: Ambulate - Progress: Goal set today Pt will Go Up / Down Stairs: 1-2 stairs;with min assist;with least restrictive assistive device PT Goal: Up/Down Stairs - Progress: Goal set today Pt will Perform Home Exercise Program: Independently PT Goal: Perform Home Exercise Program - Progress: Goal set today Pt will Propel Wheelchair: > 150 feet;Independently PT Goal: Propel Wheelchair - Progress: Goal set today  Visit Information  Last PT Received On: 01/07/12 Assistance Needed: +1    Subjective Data  Subjective: Agree to PT eval    Prior Functioning  Home Living Lives With: Significant other Available Help at Discharge: Available PRN/intermittently (only in the evenings) Type of Home: House Home Access: Stairs to enter [employer] of Steps: 3 Entrance Stairs-Rails: None Home Layout:  Two level;Able to live on main level with bedroom/bathroom Bathroom Shower/Tub: Walk-in Contractor: Standard Bathroom Accessibility: Yes How Accessible: Accessible via walker Home Adaptive Equipment: Crutches Prior Function Level of Independence: Independent Able to Take Stairs?: Yes Driving: Yes Vocation: Full time employment Communication Communication: No difficulties Dominant Hand: Right    Cognition  Overall Cognitive Status: Appears within functional limits for tasks assessed/performed Arousal/Alertness: Awake/alert Orientation Level: Appears intact for tasks assessed Behavior During Session: [facility] for tasks performed    Extremity/Trunk Assessment Right Upper Extremity Assessment RUE ROM/Strength/Tone: Within functional levels Left Upper Extremity Assessment LUE ROM/Strength/Tone: Within functional levels Right Lower Extremity Assessment RLE ROM/Strength/Tone: Within functional levels RLE Sensation: WFL - Proprioception;WFL - Light Touch RLE Coordination: WFL - gross motor Left Lower Extremity Assessment LLE ROM/Strength/Tone: Unable to fully assess (secondary to BKA) Trunk Assessment Trunk Assessment: Normal   Balance Balance Balance Assessed: Yes Static Sitting Balance Static Sitting - Balance Support: No upper extremity supported Static Sitting - Level of Assistance: 7: Independent Static Sitting - Comment/# of Minutes: several minutes on EOB with no LOB.  Static Standing Balance Static Standing - Balance Support: Bilateral upper extremity supported Static Standing - Level of Assistance: 5: Stand by assistance Static Standing - Comment/# of Minutes: 1 minute with stand by assist for safety.    End of Session PT - End of Session Equipment Utilized During Treatment: Gait belt;Left knee immobilizer Activity Tolerance: Patient limited by fatigue;Patient limited by pain Patient left: in bed;with call bell/phone within reach Nurse Communication:  Mobility status;Weight bearing status;Patient requests pain meds  GP     Austin Mcdaniel 01/07/2012, 11:58 AM  Austin Karasik L. Austin Mcdaniel DPT [phone]

## 2012-01-07 NOTE — Progress Notes (Signed)
Inpatient Diabetes Program Recommendations  AACE/ADA: New Consensus Statement on Inpatient Glycemic Control (2013)  Target Ranges:  Prepandial:   less than 140 mg/dL      Peak postprandial:   less than 180 mg/dL (1-2 hours)      Critically ill patients:  140 - 180 mg/dL   Reason for Visit: Hyperglycemia  Results for Austin Mcdaniel, Austin Mcdaniel (MRN [id number]) as of 01/07/2012 14:43  Ref. Range 01/06/2012 16:58 01/06/2012 21:45 01/07/2012 06:39 01/07/2012 11:03  Glucose-Capillary Latest Range: 70-99 mg/dL 782 (H) 956 (H) 213 (H) 201 (H)    Inpatient Diabetes Program Recommendations Insulin - Basal: D/C Lantus and begin 70/30 14 units bid Correction (SSI): Decrease Novolog correction to sensitive tidwc.  Note: Will continue to follow.

## 2012-01-07 NOTE — Progress Notes (Signed)
Internal Medicine Attending  Date: 01/07/2012  Patient name: Austin Mcdaniel Medical record number: [id number] Date of birth: 08/01/1970 Age: 41 y.o. Gender: male  I saw and evaluated the patient, and discussed his care with house staff. I reviewed the resident's note by Dr. Sherrine Maples and I agree with the resident's findings and plans as documented in her note.  Patient is doing well status post BKA.  His blood pressure is somewhat elevated, and I agree with resuming a low dose of lisinopril since his creatinine has improved; we will need to follow his renal function closely with the addition of the ACE inhibitor.  He reports a history of anxiety and has felt increased anxiety around this illness and his amputation.  He was on Lexapro in the past but not recently; he is interested in talking with psychiatry, and I agree with obtaining psychiatry consult in addition to starting citalopram.  We need to clarify what insulin he will be able to afford at home so we can transition to that regimen if needed.

## 2012-01-07 NOTE — Evaluation (Signed)
I agree with the following treatment note after reviewing documentation.   Johnston, Kemaria Dedic Brynn   OTR/L Pager: [phone] Office: [phone] .   

## 2012-01-07 NOTE — Evaluation (Signed)
Occupational Therapy Evaluation Patient Details Name: Austin Mcdaniel MRN: [id number] DOB: November 05, 1970 Today's Date: 01/07/2012 Time: 4782-9562 OT Time Calculation (min): 24 min  OT Assessment / Plan / Recommendation Clinical Impression  Pt 42 yo male s/p left BKA. Pt is doing well and would be good candidate for CIR. OT to follow acutely to adress ADL's and mobility with ADL's     OT Assessment  Patient needs continued OT Services    Follow Up Recommendations  CIR    Barriers to Discharge Decreased caregiver support (only has help in the evenings )    Equipment Recommendations  Wheelchair (measurements);Tub/shower seat    Recommendations for Other Services Rehab consult  Frequency  Min 2X/week    Precautions / Restrictions Precautions Precautions: Fall Restrictions Weight Bearing Restrictions: No   Pertinent Vitals/Pain 6/10 pain    ADL  Grooming: Performed;Wash/dry face;Teeth care;Set up Where Assessed - Grooming: Supported sitting Toilet Transfer: Buyer, retail Method: Clinical biochemist: Other (comment) (drop arm chair ) Equipment Used: Knee Immobilizer Transfers/Ambulation Related to ADLs: able to perform anterior-posterior transfer with no physical (A)  ADL Comments: Pt educated on desensitization of LLE to help with pain. Educated on exercises and given a handout. Pt also educated on proper positioning of LLE to keep elevated and knee straight to prevent contractures. Able to perform anterior-posterior transfer with supervision and min vc's for sequencing. Brushed teeth and washed face with set up.     OT Diagnosis: Generalized weakness;Acute pain  OT Problem List: Decreased activity tolerance;Decreased safety awareness;Decreased knowledge of use of DME or AE;Decreased knowledge of precautions;Pain OT Treatment Interventions: Self-care/ADL training;Therapeutic exercise;DME and/or AE instruction;Therapeutic  activities;Patient/family education   OT Goals Acute Rehab OT Goals OT Goal Formulation: With patient Time For Goal Achievement: 01/21/12 Potential to Achieve Goals: Good ADL Goals Pt Will Perform Grooming: with supervision;Standing at sink;Supported ADL Goal: Grooming - Progress: Goal set today Pt Will Perform Upper Body Dressing: with supervision;Sitting, chair;Unsupported ADL Goal: Upper Body Dressing - Progress: Goal set today Pt Will Perform Lower Body Dressing: with min assist;Sit to stand from chair;Sit to stand from bed;Supported ADL Goal: Lower Body Dressing - Progress: Goal set today Pt Will Transfer to Toilet: with supervision;Ambulation ADL Goal: Toilet Transfer - Progress: Goal set today Pt Will Perform Toileting - Clothing Manipulation: with supervision;Sitting on 3-in-1 or toilet;Standing ADL Goal: Toileting - Clothing Manipulation - Progress: Goal set today Pt Will Perform Toileting - Hygiene: with supervision;Sitting on 3-in-1 or toilet ADL Goal: Toileting - Hygiene - Progress: Goal set today  Visit Information  Last OT Received On: 01/07/12 Assistance Needed: +1    Subjective Data  Subjective: I did much better than I thought I would  Patient Stated Goal: To get more rehab before going home and eventually get a prosthetic   Prior Functioning     Home Living Lives With: Significant other Available Help at Discharge: Available PRN/intermittently (only in the evenings) Type of Home: House Home Access: Stairs to enter [employer] of Steps: 3 Entrance Stairs-Rails: None Home Layout: Two level;Able to live on main level with bedroom/bathroom Bathroom Shower/Tub: Walk-in Contractor: Standard Bathroom Accessibility: Yes How Accessible: Accessible via walker Home Adaptive Equipment: Crutches Prior Function Level of Independence: Independent Able to Take Stairs?: Yes Driving: Yes Vocation: Full time  employment Communication Communication: No difficulties Dominant Hand: Right         Vision/Perception     Cognition  Overall Cognitive Status: Appears within functional limits for tasks  assessed/performed Arousal/Alertness: Awake/alert Orientation Level: Appears intact for tasks assessed Behavior During Session: [facility] for tasks performed    Extremity/Trunk Assessment Right Upper Extremity Assessment RUE ROM/Strength/Tone: Within functional levels Left Upper Extremity Assessment LUE ROM/Strength/Tone: Within functional levels Trunk Assessment Trunk Assessment: Normal     Mobility Bed Mobility Bed Mobility: Supine to Sit Supine to Sit: 5: Supervision Details for Bed Mobility Assistance: cues for sequencing, required no physical (A)  Transfers Transfers:  (anterior-posterior ) Details for Transfer Assistance: able to use bil UE to transfer to chair with no (A)                     End of Session OT - End of Session Equipment Utilized During Treatment: Left knee immobilizer Activity Tolerance: Patient tolerated treatment well Patient left: in chair;with call bell/phone within reach Nurse Communication: Mobility status;Patient requests pain meds  GO     Cleora Fleet 01/07/2012, 10:34 AM

## 2012-01-08 DIAGNOSIS — S98139A Complete traumatic amputation of one unspecified lesser toe, initial encounter: Secondary | ICD-10-CM

## 2012-01-08 DIAGNOSIS — E1165 Type 2 diabetes mellitus with hyperglycemia: Principal | ICD-10-CM

## 2012-01-08 DIAGNOSIS — M869 Osteomyelitis, unspecified: Secondary | ICD-10-CM

## 2012-01-08 LAB — BASIC METABOLIC PANEL
BUN: 17 mg/dL (ref 6–23)
CO2: 26 mEq/L (ref 19–32)
Chloride: 102 mEq/L (ref 96–112)
Creatinine, Ser: 1.53 mg/dL — ABNORMAL HIGH (ref 0.50–1.35)
GFR calc Af Amer: 64 mL/min — ABNORMAL LOW (ref >90)
Potassium: 4.3 mEq/L (ref 3.5–5.1)

## 2012-01-08 LAB — CULTURE, BLOOD (ROUTINE X 2): Culture: NO GROWTH

## 2012-01-08 LAB — CBC
HCT: 25.6 % — ABNORMAL LOW (ref 39.0–52.0)
Hemoglobin: 8.8 g/dL — ABNORMAL LOW (ref 13.0–17.0)
MCHC: 34.4 g/dL (ref 30.0–36.0)
MCV: 87.4 fL (ref 78.0–100.0)
RDW: 12.1 % (ref 11.5–15.5)

## 2012-01-08 LAB — GLUCOSE, CAPILLARY
Glucose-Capillary: 134 mg/dL — ABNORMAL HIGH (ref 70–99)
Glucose-Capillary: 154 mg/dL — ABNORMAL HIGH (ref 70–99)

## 2012-01-08 MED ORDER — INSULIN LISPRO PROT & LISPRO (75-25 MIX) 100 UNIT/ML ~~LOC~~ SUSP: 14.0000 [IU] | Freq: Two times a day (BID) | SUBCUTANEOUS | Status: DC

## 2012-01-08 MED ORDER — PRAVASTATIN SODIUM 20 MG PO TABS: 20.0000 mg | ORAL_TABLET | Freq: Every day | ORAL | Status: DC

## 2012-01-08 MED ORDER — CITALOPRAM HYDROBROMIDE 20 MG PO TABS: 20.0000 mg | ORAL_TABLET | Freq: Every day | ORAL | Status: DC

## 2012-01-08 MED ORDER — LISINOPRIL 10 MG PO TABS: 10.0000 mg | ORAL_TABLET | Freq: Every day | ORAL | Status: DC

## 2012-01-08 MED ORDER — PANTOPRAZOLE SODIUM 40 MG PO TBEC: 40.0000 mg | DELAYED_RELEASE_TABLET | Freq: Every day | ORAL | Status: DC

## 2012-01-08 MED ORDER — INSULIN ASPART 100 UNIT/ML ~~LOC~~ SOLN: 4.0000 [IU] | Freq: Three times a day (TID) | SUBCUTANEOUS | Status: DC

## 2012-01-08 MED ORDER — INSULIN ASPART 100 UNIT/ML ~~LOC~~ SOLN: 0.0000 [IU] | Freq: Every day | SUBCUTANEOUS | Status: DC

## 2012-01-08 MED ORDER — INSULIN ASPART 100 UNIT/ML ~~LOC~~ SOLN: 0.0000 [IU] | Freq: Three times a day (TID) | SUBCUTANEOUS | Status: DC

## 2012-01-08 MED ORDER — LISINOPRIL 5 MG PO TABS
5.0000 mg | ORAL_TABLET | Freq: Once | ORAL | Status: DC
  Filled 2012-01-08: qty 1

## 2012-01-08 MED ORDER — OXYCODONE HCL 5 MG PO TABS: 5.0000 mg | ORAL_TABLET | ORAL | Status: DC | PRN

## 2012-01-08 MED ORDER — INSULIN ASPART PROT & ASPART (70-30 MIX) 100 UNIT/ML ~~LOC~~ SUSP
14.0000 [IU] | Freq: Two times a day (BID) | SUBCUTANEOUS | Status: DC
  Filled 2012-01-08: qty 3

## 2012-01-08 NOTE — Progress Notes (Signed)
Internal Medicine Attending  Date: 01/08/2012  Patient name: Austin Mcdaniel Medical record number: [id number] Date of birth: 02/14/1971 Age: 41 y.o. Gender: male  I saw and evaluated the patient on a.m. rounds with house staff; see the note by resident Dr. Bosie Clos for details of clinical findings and plans.  Physical therapy and the inpatient rehabilitation physician have recommended that he be discharged home with home physical therapy.  He is doing well, without complaints.  He is now on 70/30 insulin, and will need close follow-up of his diabetes as outpatient.  His renal function has continued to improve; and will need to be monitored closely on low-dose ACE inhibitor for his hypertension.

## 2012-01-08 NOTE — Progress Notes (Signed)
Pt wants & Rehab MD agrees w/ d/c home.  Pt planning to d/c home today.  CIR will sign off.  [phone]

## 2012-01-08 NOTE — Progress Notes (Addendum)
Subjective: No acute events overnight, pain controlled   Objective: Vital signs in last 24 hours: Filed Vitals:   01/07/12 1300 01/07/12 1400 01/07/12 2212 01/08/12 0701  BP: 151/84 173/87 152/85 179/87  Pulse: 90 79 74 76  Temp: 98.6 F (37 C) 98.2 F (36.8 C)    TempSrc: Oral     Resp:  18 18 18   Height:      Weight:      SpO2:  95% 94% 96%   Weight change:   Intake/Output Summary (Last 24 hours) at 01/08/12 1410 Last data filed at 01/08/12 0700  Gross per 24 hour  Intake   4260 ml  Output    400 ml  Net   3860 ml   Physical Exam:  General:  Well-developed, well-nourished, in no acute distress; alert, appropriate and cooperative throughout examination.   Lungs:  Normal respiratory effort. Clear to auscultation BL without crackles or wheezes.   Heart:  RRR. S1 and S2 normal without gallop, murmur, or rubs appreciated   Extremities:  LLE foot bandaged c/d/i    Lab Results: Basic Metabolic Panel:  Lab 01/08/12 1610 01/07/12 1147  NA 136 135  K 4.3 4.4  CL 102 100  CO2 26 25  GLUCOSE 130* 210*  BUN 17 21  CREATININE 1.53* 1.62*  CALCIUM 8.6 8.8  MG -- --  PHOS -- --   Liver Function Tests:  Lab 01/02/12 1225  AST 8  ALT 8  ALKPHOS 89  BILITOT 0.2*  PROT 7.7  ALBUMIN 2.6*   CBC:  Lab 01/08/12 0610 01/07/12 1147 01/02/12 1225  WBC 9.3 10.5 --  NEUTROABS -- -- 9.1*  HGB 8.8* 9.5* --  HCT 25.6* 27.7* --  MCV 87.4 87.1 --  PLT 348 384 --   CBG:  Lab 01/08/12 1115 01/08/12 0702 01/07/12 2214 01/07/12 1624 01/07/12 1103 01/07/12 0639  GLUCAP 154* 134* 145* 161* 201* 146*   Hemoglobin A1C:  Lab 01/03/12 1112  HGBA1C 14.5*   Fasting Lipid Panel:  Lab 01/02/12 1951  CHOL 198  HDL 10*  LDLCALC 132*  TRIG 280*  CHOLHDL 19.8  LDLDIRECT --   Anemia Panel:  Lab 01/05/12 1155  VITAMINB12 718  FOLATE 9.5  FERRITIN 678*  TIBC 195*  IRON 31*  RETICCTPCT 1.0   Urinalysis:  Lab 01/02/12 1420  COLORURINE YELLOW  LABSPEC 1.025  PHURINE  5.0  GLUCOSEU >1000*  HGBUR MODERATE*  BILIRUBINUR SMALL*  KETONESUR 15*  PROTEINUR 100*  UROBILINOGEN 0.2  NITRITE NEGATIVE  LEUKOCYTESUR NEGATIVE    Micro Results: Recent Results (from the past 240 hour(s))  CULTURE, BLOOD (ROUTINE X 2)     Status: Normal   Collection Time   01/02/12 12:33 PM      Component Value Range Status Comment   Specimen Description BLOOD RIGHT ANTECUBITAL   Final    Special Requests BOTTLES DRAWN AEROBIC AND ANAEROBIC 10CC   Final    Culture  Setup Time 01/02/2012 18:57   Final    Culture NO GROWTH 5 DAYS   Final    Report Status 01/08/2012 FINAL   Final   CULTURE, BLOOD (ROUTINE X 2)     Status: Normal   Collection Time   01/02/12 12:38 PM      Component Value Range Status Comment   Specimen Description BLOOD LEFT ANTECUBITAL   Final    Special Requests BOTTLES DRAWN AEROBIC AND ANAEROBIC 10CC   Final    Culture  Setup Time 01/02/2012 18:57  Final    Culture NO GROWTH 5 DAYS   Final    Report Status 01/08/2012 FINAL   Final   WOUND CULTURE     Status: Normal   Collection Time   01/02/12  4:31 PM      Component Value Range Status Comment   Specimen Description WOUND FOOT LEFT   Final    Special Requests Normal   Final    Gram Stain     Final    Value: NO WBC SEEN     ABUNDANT SQUAMOUS EPITHELIAL CELLS PRESENT     ABUNDANT GRAM POSITIVE COCCI     IN PAIRS IN CLUSTERS MODERATE GRAM POSITIVE RODS     FEW GRAM NEGATIVE RODS   Culture     Final    Value: MODERATE GROUP B STREP(S.AGALACTIAE)ISOLATED     Note: TESTING AGAINST S. AGALACTIAE NOT ROUTINELY PERFORMED DUE TO PREDICTABILITY OF AMP/PEN/VAN SUSCEPTIBILITY.   Report Status 01/04/2012 FINAL   Final   SURGICAL PCR SCREEN     Status: Abnormal   Collection Time   01/05/12 12:10 AM      Component Value Range Status Comment   MRSA, PCR NEGATIVE  NEGATIVE Final    Staphylococcus aureus POSITIVE (*) NEGATIVE Final    Studies/Results: No results found. Medications: I have reviewed the patient's  current medications. Scheduled Meds:   . citalopram  10 mg Oral Daily  . docusate sodium  100 mg Oral BID  . enoxaparin (LOVENOX) injection  40 mg Subcutaneous Q24H  . insulin aspart  0-5 Units Subcutaneous QHS  . insulin aspart  0-9 Units Subcutaneous TID WC  . insulin aspart  4 Units Subcutaneous TID WC  . insulin aspart protamine-insulin aspart  14 Units Subcutaneous BID WC  . lisinopril  10 mg Oral Daily  . lisinopril  5 mg Oral Once  . pantoprazole  40 mg Oral Daily  . senna  1 tablet Oral BID  . simvastatin  10 mg Oral QHS  . [DISCONTINUED] insulin aspart  0-20 Units Subcutaneous TID WC  . [DISCONTINUED] insulin aspart  8 Units Subcutaneous TID WC  . [DISCONTINUED] insulin glargine  14 Units Subcutaneous Daily  . [DISCONTINUED] lisinopril  5 mg Oral Daily  . [DISCONTINUED] piperacillin-tazobactam (ZOSYN)  IV  3.375 g Intravenous Q8H  . [DISCONTINUED] vancomycin  750 mg Intravenous Q12H   Continuous Infusions:   . sodium chloride 150 mL/hr at 01/06/12 0856   PRN Meds:.acetaminophen, acetaminophen, HYDROmorphone (DILAUDID) injection, metoCLOPramide (REGLAN) injection, metoCLOPramide, ondansetron (ZOFRAN) IV, ondansetron, oxyCODONE, [DISCONTINUED]  morphine injection   Assessment/Plan: Pt is a 41 y.o. yo male with a PMHx of poorly controlled diabetes and diabetic foot ulcers s/p prior toe amputations who was admitted on 01/02/2012 with symptoms of left foot pain, swelling, and drainage.   1. Osteomyelitis LLE: s/p BKA, received 6 days of Vanc and Zosyn -will d/c antibiotics given that incision was through healthy tissue per Surgery   Lab 01/08/12 0610 01/07/12 1147 01/06/12 0830 01/05/12 1909 01/05/12 1155  WBC 9.3 10.5 7.3 7.8 6.0     2. Diabetes Mellitus Type 2: poorly controlled secondary to noncompliance with meds (financial), improved cbg on basal insulin and SSI with meal coverage, DME consulted, transitioned to 70/30 insulin  -SSI sensitive  -convert to Humolog  75/25 for affordability upon discharge  CBG (last 3)   Basename 01/08/12 1115 01/08/12 0702 01/07/12 2214  GLUCAP 154* 134* 145*    3. Acute Kidney Injury: trending towards baseline, likely secondary  to  -cont NS IV fluids to 150cc/h  -cont to monitor creatinine    Lab 01/08/12 0610 01/07/12 1147 01/06/12 0830 01/05/12 1909 01/05/12 1155  CREATININE 1.53* 1.62* 1.93* 2.18* 2.06*     4. Hypertension: above goal, pt discharged last year on lisinopril 10 mg qd but noncompliant due to financial concerns -increase lisinopril to 10 mg qd -monitor bp   5. Anxiety: pt with complaints of anxiety during admission especially concerning his surgery, was started on citalopram 10 mg yesterday -question whether there is a depression component thus will increase citalopram to 20 mg qd which is starting dosage for depression  6. Disposition: case manager consulted for financial assistance with medications, Social worker to assist with financial counseling assistance, Inpt rehab consulted but pt declined preferring Home Health Services -Home Health PT, OT, RN for dressing changes/wound care -f/u Dr. Victorino Dike of [facility] 11/20 at 3:45 pm -f/u in Internal medicine Clinic 11/21 at 10:45.  Will need close diabetic and hypertension follow-up and recheck of creatinine while on ACEi.   The patient does not have transportation limitations that hinder transportation to clinic appointments.  .Services Needed at time of discharge: Y = Yes, Blank = No PT: Y  OT: Y  RN: Y  Equipment: Walker, tub bench, manual wheelchair  Other:     LOS: 6 days   Teshawn Moan 01/08/2012, 2:10 PM

## 2012-01-08 NOTE — Progress Notes (Signed)
CARE MANAGEMENT NOTE 01/08/2012  Patient:  AustinBaruc   Account Number:  [id number]  Date Initiated:  01/05/2012  Documentation initiated by:  Vance Peper  Subjective/Objective Assessment:   41 yr old male adm for left foot ulcer.     Action/Plan:   Patient will go to OR on 01/06/12 for Left BKA. CM will follow.  CIR referral.   Anticipated DC Date:  01/08/2012   Anticipated DC Plan:  HOME W HOME HEALTH SERVICES      DC Planning Services  CM consult      [facility] Choice  HOME HEALTH   Choice offered to / List presented to:     DME arranged  WHEELCHAIR - MANUAL  TUB BENCH  3-N-1      DME agency  [facility]     HH arranged  HH-1 RN  HH-2 PT      [facility] agency  [facility]   Status of service:  Completed, signed off Medicare Important Message given?   (If response is "NO", the following Medicare IM given date fields will be blank) Date Medicare IM given:   Date Additional Medicare IM given:    Discharge Disposition:  HOME W HOME HEALTH SERVICES  Per UR Regulation:    If discussed at Long Length of Stay Meetings, dates discussed:    Comments:  01/08/12 10:53 Vance Peper, RN BSN Case Manager CM spoke with patient concerning home health and DME needs at discharge. Gave patient brochure with information for [employer] group and contacted Mr.  Sharmon Revere to contact him. Patient to recieve appointment to followup with internal Medicine Clinic from resident.

## 2012-01-08 NOTE — Progress Notes (Signed)
CSW arrived in patient's room today at 4:45 and found patient had already discharged.  Called patient at [phone] to offer support but no answer.   Carney Bern, LCSWA Clinical Social Worker [phone]

## 2012-01-08 NOTE — Progress Notes (Signed)
I agree with the following treatment note after reviewing documentation.   Johnston, Charlene Cowdrey Brynn   OTR/L Pager: [phone] Office: [phone] .   

## 2012-01-08 NOTE — Progress Notes (Signed)
Patient discharged in stable condition via wheelchair. Discharge instructions and prescriptions were given and explained 

## 2012-01-08 NOTE — Consult Note (Signed)
Physical Medicine and Rehabilitation Consult Reason for Consult: Left below-knee amputation Referring Physician: Dr. Victorino Dike   HPI: Austin Mcdaniel is a 41 y.o. right handed male with history of type 2 diabetes mellitus with peripheral neuropathy and tobacco abuse and peripheral vascular disease with multiple left toe amputations. Admitted 01/02/2012 with left dorsal foot ulcer that has progressively grown in size. X-rays of left foot findings consistent with cellulitis no definite osteomyelitis Limb was not felt to be salvageable. 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 Victorino Dike. Postoperative pain management. Close monitoring of creatinine presently 1.62 with a baseline creatinine of 1.99. Placed on subcutaneous Lovenox for DVT prophylaxis. Hemoglobin A1c of 14.5 and insulin therapy as directed. Postoperative anemia 9.5 and monitored. Physical and occupational therapy evaluations completed an ongoing with recommendations of physical medicine rehabilitation consult to consider inpatient rehabilitation services  Review of Systems  Respiratory: Positive for cough.   Cardiovascular: Positive for leg swelling.  Gastrointestinal: Positive for constipation.  Psychiatric/Behavioral: Positive for depression.  All other systems reviewed and are negative.   Past Medical History  Diagnosis Date  . Acid reflux   . Diabetic foot ulcer 06/2011; 10/2011-12/2011    left dorsal foot  (01/03/2012)  . Osteomyelitis of left foot   . Type II diabetes mellitus    Past Surgical History  Procedure Date  . Toe amputation 2009; 2012?    left great; left 2nd toe  . Amputation 01/06/2012    Procedure: AMPUTATION BELOW KNEE;  Surgeon: Toni Arthurs, MD;  Location: [facility] OR;  Service: Orthopedics;  Laterality: Left;  DR.HEWITT WOULD LIKE TO FOLLOW HIS OTHER AFTERNOON CASE STARTING AROUND 1700-1715   History reviewed. No pertinent family history. Social History:  reports that he has been smoking Cigars and  Cigarettes.  He has a 1.75 pack-year smoking history. His smokeless tobacco use includes Snuff. He reports that he drinks about 1.2 ounces of alcohol per week. He reports that he does not use illicit drugs. Allergies: No Known Allergies Medications Prior to Admission  Medication Sig Dispense Refill  . omeprazole (PRILOSEC) 20 MG capsule Take 20 mg by mouth daily.      Marland Kitchen sulfamethoxazole-trimethoprim (BACTRIM DS) 800-160 MG per tablet Take 1 tablet by mouth 2 (two) times daily.        Home: Home Living Lives With: Significant other Available Help at Discharge: Available PRN/intermittently (only in the evenings) Type of Home: House Home Access: Stairs to enter [employer] of Steps: 3 Entrance Stairs-Rails: None Home Layout: Two level;Able to live on main level with bedroom/bathroom Bathroom Shower/Tub: Walk-in Contractor: Standard Bathroom Accessibility: Yes How Accessible: Accessible via walker Home Adaptive Equipment: Crutches  Functional History: Prior Function Able to Take Stairs?: Yes Driving: Yes Vocation: Full time employment Functional Status:  Mobility: Bed Mobility Bed Mobility: Sit to Supine Supine to Sit: Not tested (comment) Sit to Supine: 5: Supervision Transfers Transfers: Sit to Stand;Stand to Sit;Stand Pivot Transfers Sit to Stand: 4: Min assist;With upper extremity assist;From chair/3-in-1;With armrests Stand to Sit: 4: Min guard;To bed;With upper extremity assist Stand Pivot Transfers: 4: Min guard Ambulation/Gait Ambulation/Gait Assistance: 4: Min guard Ambulation Distance (Feet): 10 Feet Assistive device: Rolling walker Ambulation/Gait Assistance Details: Min guard for safety.  Gait Pattern: Step-to pattern General Gait Details: Pt's c/o UE fatigue Stairs: No Wheelchair Mobility Wheelchair Mobility: No  ADL: ADL Grooming: Performed;Wash/dry face;Teeth care;Set up Where Assessed - Grooming: Supported sitting Toilet  Transfer: Buyer, retail Method: Clinical biochemist: Other (comment) (drop arm  chair ) Equipment Used: Knee Immobilizer Transfers/Ambulation Related to ADLs: able to perform anterior-posterior transfer with no physical (A)  ADL Comments: Pt educated on desensitization of LLE to help with pain. Educated on exercises and given a handout. Pt also educated on proper positioning of LLE to keep elevated and knee straight to prevent contractures. Able to perform anterior-posterior transfer with supervision and min vc's for sequencing. Brushed teeth and washed face with set up.   Cognition: Cognition Arousal/Alertness: Awake/alert Orientation Level: Oriented X4 Cognition Overall Cognitive Status: Appears within functional limits for tasks assessed/performed Arousal/Alertness: Awake/alert Orientation Level: Appears intact for tasks assessed Behavior During Session: [facility] for tasks performed  Blood pressure 152/85, pulse 74, temperature 98.2 F (36.8 C), temperature source Oral, resp. rate 18, height 6\' 1"  (1.854 m), weight 102.059 kg (225 lb), SpO2 94.00%. Physical Exam  Vitals reviewed. Constitutional: He is oriented to person, place, and time.  HENT:  Head: Normocephalic.  Eyes:       Pupils round and reactive to light  Neck: Normal range of motion. Neck supple. No thyromegaly present.  Cardiovascular: Normal rate and regular rhythm.   Pulmonary/Chest: Breath sounds normal. No respiratory distress.  Abdominal: Soft. Bowel sounds are normal. He exhibits no distension.  Neurological: He is alert and oriented to person, place, and time.       Follows full commands  Skin:       Amputation site is dressed  Psychiatric: He has a normal mood and affect.    Results for orders placed during the hospital encounter of 01/02/12 (from the past 24 hour(s))  GLUCOSE, CAPILLARY     Status: Abnormal   Collection Time   01/07/12  6:39 AM       Component Value Range   Glucose-Capillary 146 (*) 70 - 99 mg/dL  GLUCOSE, CAPILLARY     Status: Abnormal   Collection Time   01/07/12 11:03 AM      Component Value Range   Glucose-Capillary 201 (*) 70 - 99 mg/dL   Comment 1 Notify RN     Comment 2 Documented in Chart    BASIC METABOLIC PANEL     Status: Abnormal   Collection Time   01/07/12 11:47 AM      Component Value Range   Sodium 135  135 - 145 mEq/L   Potassium 4.4  3.5 - 5.1 mEq/L   Chloride 100  96 - 112 mEq/L   CO2 25  19 - 32 mEq/L   Glucose, Bld 210 (*) 70 - 99 mg/dL   BUN 21  6 - 23 mg/dL   Creatinine, Ser 1.61 (*) 0.50 - 1.35 mg/dL   Calcium 8.8  8.4 - 09.6 mg/dL   GFR calc non Af Amer 51 (*) >90 mL/min   GFR calc Af Amer 59 (*) >90 mL/min  CBC     Status: Abnormal   Collection Time   01/07/12 11:47 AM      Component Value Range   WBC 10.5  4.0 - 10.5 K/uL   RBC 3.18 (*) 4.22 - 5.81 MIL/uL   Hemoglobin 9.5 (*) 13.0 - 17.0 g/dL   HCT 04.5 (*) 40.9 - 81.1 %   MCV 87.1  78.0 - 100.0 fL   MCH 29.9  26.0 - 34.0 pg   MCHC 34.3  30.0 - 36.0 g/dL   RDW 91.4  78.2 - 95.6 %   Platelets 384  150 - 400 K/uL  GLUCOSE, CAPILLARY     Status: Abnormal  Collection Time   01/07/12  4:24 PM      Component Value Range   Glucose-Capillary 161 (*) 70 - 99 mg/dL   Comment 1 Notify RN    GLUCOSE, CAPILLARY     Status: Abnormal   Collection Time   01/07/12 10:14 PM      Component Value Range   Glucose-Capillary 145 (*) 70 - 99 mg/dL   No results found.  Assessment/Plan: Diagnosis: left BKA  RECOMMENDATIONS: This patient's condition is appropriate for continued rehabilitative care in the following setting: [facility] Patient has agreed to participate in recommended program. Yes Note that insurance prior authorization may be required for reimbursement for recommended care.  Comment:This patient has assistance at home during the day. He was min assist CG assist first time with PT. Should do fine at home and wants to go  home.  Ivory Broad, MD    01/08/2012

## 2012-01-08 NOTE — Progress Notes (Signed)
Occupational Therapy Treatment Patient Details Name: Austin Mcdaniel MRN: [id number] DOB: 04-11-1970 Today's Date: 01/08/2012 Time: 4098-1191 OT Time Calculation (min): 23 min  OT Assessment / Plan / Recommendation Comments on Treatment Session Pt progressing well. Performs ADL's at supervision level with minimal verbal cueing. Pt planning to d/c home possibly today. OT signing off.     Follow Up Recommendations  Home health OT    Barriers to Discharge       Equipment Recommendations  Wheelchair (measurements);Tub/shower seat;Rolling walker with 5" wheels    Recommendations for Other Services    Frequency Min 2X/week   Plan All goals met and education completed, patient discharged from OT services    Precautions / Restrictions Precautions Precautions: Fall Restrictions Weight Bearing Restrictions: No   Pertinent Vitals/Pain Pain level 4/10    ADL  Grooming: Performed;Wash/dry hands;Supervision/safety Where Assessed - Grooming: Supported standing Lower Body Bathing: Simulated;Supervision/safety Where Assessed - Lower Body Bathing: Supported sit to stand Upper Body Dressing: Simulated;Supervision/safety Where Assessed - Upper Body Dressing: Unsupported sitting Lower Body Dressing: Performed;Supervision/safety Where Assessed - Lower Body Dressing: Supported sit to Pharmacist, hospital: Research scientist (life sciences) Method: Sit to Barista: Raised toilet seat with arms (or 3-in-1 over toilet) Toileting - Clothing Manipulation and Hygiene: Performed;Supervision/safety Where Assessed - Glass blower/designer Manipulation and Hygiene: Standing Equipment Used: Knee Immobilizer;Rolling walker;Gait belt Transfers/Ambulation Related to ADLs: Performed transfers and ambulation in room with supervision. Cues to slow down and take time while ambulating.   ADL Comments: Pt is mod I for bed mobility. Ambulated to bathroom to perform toilet transfer and  hand washing at supervision level. Able to dress UB/LB with supervision, requireing cues for correct LB dressing technique. Reviewed HEP and residual limb care and wrapping.     OT Diagnosis:    OT Problem List:   OT Treatment Interventions:     OT Goals Acute Rehab OT Goals OT Goal Formulation: With patient Time For Goal Achievement: 01/21/12 Potential to Achieve Goals: Good ADL Goals Pt Will Perform Grooming: with supervision;Standing at sink;Supported ADL Goal: Grooming - Progress: Met Pt Will Perform Upper Body Dressing: with supervision;Sitting, chair;Unsupported ADL Goal: Upper Body Dressing - Progress: Met Pt Will Perform Lower Body Dressing: with min assist;Sit to stand from chair;Sit to stand from bed;Supported ADL Goal: Lower Body Dressing - Progress: Met Pt Will Transfer to Toilet: with supervision;Ambulation ADL Goal: Toilet Transfer - Progress: Met Pt Will Perform Toileting - Clothing Manipulation: with supervision;Sitting on 3-in-1 or toilet;Standing ADL Goal: Toileting - Clothing Manipulation - Progress: Met Pt Will Perform Toileting - Hygiene: with supervision;Sitting on 3-in-1 or toilet ADL Goal: Toileting - Hygiene - Progress: Met  Visit Information  Last OT Received On: 01/08/12 Assistance Needed: +1               Cognition  Overall Cognitive Status: Appears within functional limits for tasks assessed/performed Arousal/Alertness: Awake/alert Orientation Level: Appears intact for tasks assessed Behavior During Session: [facility] for tasks performed    Mobility  Shoulder Instructions Bed Mobility Bed Mobility: Supine to Sit;Sit to Supine;Sitting - Scoot to Edge of Bed Supine to Sit: 6: Modified independent (Device/Increase time);With rails Sitting - Scoot to Edge of Bed: 6: Modified independent (Device/Increase time) Sit to Supine: 6: Modified independent (Device/Increase time);With rail;HOB elevated Details for Bed Mobility Assistance: cues for technique    Transfers Transfers: Sit to Stand;Stand to Sit Sit to Stand: 5: Supervision;With upper extremity assist;From bed;From chair/3-in-1 Stand to Sit: 5: Supervision;With upper extremity assist;To  bed;To chair/3-in-1 Details for Transfer Assistance: Pt demonstrated good hand placement, required no physical (A)                  End of Session OT - End of Session Equipment Utilized During Treatment: Gait belt;Left knee immobilizer Activity Tolerance: Patient tolerated treatment well Patient left: in bed;with call bell/phone within reach Nurse Communication: Mobility status  GO     Cleora Fleet 01/08/2012, 2:56 PM

## 2012-01-08 NOTE — Progress Notes (Signed)
Physical Therapy Treatment Patient Details Name: Austin Mcdaniel MRN: [id number] DOB: [birth year] Today's Date: 01/08/2012 Time: 8295-6213 PT Time Calculation (min): 16 min  PT Assessment / Plan / Recommendation Comments on Treatment Session  Pt  has met acute PT goals    Follow Up Recommendations  Home health PT     Does the patient have the potential to tolerate intense rehabilitation     Barriers to Discharge        Equipment Recommendations  Rolling walker with 5" wheels;Wheelchair (measurements);Tub/shower seat;3 in 1 bedside comode    Recommendations for Other Services    Frequency     Plan All goals met and education completed, patient dischaged from PT services    Precautions / Restrictions Precautions Precautions: Fall Restrictions Weight Bearing Restrictions: Yes LLE Weight Bearing: Non weight bearing   Pertinent Vitals/Pain 5/10 pain in L residual limb    Mobility  Bed Mobility Bed Mobility: Not assessed Supine to Sit: 6: Modified independent (Device/Increase time);With rails Sitting - Scoot to Edge of Bed: 6: Modified independent (Device/Increase time) Sit to Supine: 6: Modified independent (Device/Increase time);With rail;HOB elevated Details for Bed Mobility Assistance: cues for technique  Transfers Transfers: Sit to Stand;Stand to Sit;Stand Pivot Transfers Sit to Stand: 6: Modified independent (Device/Increase time);From bed;From chair/3-in-1;With upper extremity assist Stand to Sit: 6: Modified independent (Device/Increase time);To chair/3-in-1;With upper extremity assist Stand Pivot Transfers: 5: Supervision Details for Transfer Assistance: No assist required.  Ambulation/Gait Ambulation/Gait Assistance: 5: Supervision Ambulation Distance (Feet): 15 Feet Assistive device: Rolling walker Ambulation/Gait Assistance Details: supervision for safety Gait Pattern: Step-to pattern Stairs: Yes Stairs Assistance: 4: Min assist Stairs Assistance Details  (indicate cue type and reason): assist to manage walker. Instruction via verbal and visual cueing.  Stair Management Technique: With walker;Backwards Number of Stairs: 2  Wheelchair Mobility Wheelchair Mobility: No    Exercises     PT Diagnosis:    PT Problem List:   PT Treatment Interventions:     PT Goals Acute Rehab PT Goals PT Goal Formulation: With patient Time For Goal Achievement: 01/14/12 Potential to Achieve Goals: Good Pt will Transfer Bed to Chair/Chair to Bed: with modified independence PT Transfer Goal: Bed to Chair/Chair to Bed - Progress: Met Pt will Ambulate: 16 - 50 feet;with modified independence;with rolling walker PT Goal: Ambulate - Progress: Met Pt will Go Up / Down Stairs: 1-2 stairs;with min assist;with least restrictive assistive device PT Goal: Up/Down Stairs - Progress: Met Pt will Perform Home Exercise Program: Independently  Visit Information  Last PT Received On: 01/08/12 Assistance Needed: +1    Subjective Data  Subjective: I am going home.  Patient Stated Goal: return to home   Cognition  Overall Cognitive Status: Appears within functional limits for tasks assessed/performed Arousal/Alertness: Awake/alert Orientation Level: Appears intact for tasks assessed Behavior During Session: [facility] for tasks performed    Balance  Balance Balance Assessed: No  End of Session PT - End of Session Equipment Utilized During Treatment: Gait belt;Left knee immobilizer Activity Tolerance: Patient tolerated treatment well Patient left: in chair;with call bell/phone within reach Nurse Communication: Mobility status;Weight bearing status;Patient requests pain meds   GP     Libbey Duce 01/08/2012, 4:12 PM Rickie Gange L. Shauntae Reitman DPT [phone]

## 2012-01-08 NOTE — Discharge Summary (Signed)
Internal Medicine Teaching [facility] Discharge Note  Name: Austin Mcdaniel MRN: [id number] DOB: [birth year] 41 y.o.  Date of Admission: 01/02/2012 11:55 AM Date of Discharge: 01/08/2012 Attending Physician: Farley Ly, MD  Discharge Diagnosis: Principal Problem: Osteomyelitis of left foot  Diabetic ulcer of left foot Active Problems:  Great toe amputation status  Toe amputation status  Anxiety  Diabetes Mellitus, Type 2 poorly controlled  Hypertension  Hyperlipidemia  Discharge Medications:   Medication List     As of 01/08/2012  2:00 PM    STOP taking these medications         sulfamethoxazole-trimethoprim 800-160 MG per tablet   Commonly known as: BACTRIM DS      TAKE these medications         aspirin EC 325 MG tablet   Take 1 tablet (325 mg total) by mouth daily.      citalopram 20 MG tablet   Commonly known as: CELEXA   Take 1 tablet (20 mg total) by mouth daily.      docusate sodium 100 MG capsule   Commonly known as: COLACE   Take 1 capsule (100 mg total) by mouth 2 (two) times daily. While taking narcotic pain medicine.      insulin lispro protamine-insulin lispro (75-25) 100 UNIT/ML Susp   Commonly known as: HUMALOG 75/25   Inject 14 Units into the skin 2 (two) times daily with a meal.      lisinopril 10 MG tablet   Commonly known as: PRINIVIL,ZESTRIL   Take 1 tablet (10 mg total) by mouth daily.      omeprazole 20 MG capsule   Commonly known as: PRILOSEC   Take 20 mg by mouth daily.      oxyCODONE 5 MG immediate release tablet   Commonly known as: Oxy IR/ROXICODONE   Take 1-2 tablets (5-10 mg total) by mouth every 4 (four) hours as needed for pain.      pravastatin 20 MG tablet   Commonly known as: PRAVACHOL   Take 1 tablet (20 mg total) by mouth daily.      senna 8.6 MG tablet   Commonly known as: SENOKOT   Take 2 tablets (17.2 mg total) by mouth daily. While taking narcotic pain medicine.        Disposition and follow-up:     Austin Mcdaniel was discharged from [facility] in stable condition.  At the hospital follow up visit please address blood pressure management and compliance with antihypertensive medication, insulin compliance, wound care, and recheck creatinine level.  Follow-up Appointments:     Follow-up Information    Follow up with Toni Arthurs, MD. On 01/14/2012. (at 3:45pm)    Contact information:   [facility] [address] [place] [address] [phone]       Follow up with Lyn Hollingshead, MD. On 01/15/2012. (at 10:45am)    Contact information:   [facility] [address] [place] [address] [phone]         Discharge Orders    Future Appointments: Provider: Department: Dept Phone: Center:   01/15/2012 10:45 AM Sunday Spillers, MD [facility] [phone] [facility]     Future Orders Please Complete By Expires   Diet - low sodium heart healthy      Increase activity slowly      Discharge instructions      Comments:   Follow-up with Orthopedic Surgeon Dr. Victorino Dike and the Internal Medicine  Clinic as scheduled. Home Health agency will be in contact with you to schedule home visit for therapy and to evaluate your surgical site.      Consultations: Orthopedics  Procedures Performed:  Dg Chest 2 View  01/02/2012  *RADIOLOGY REPORT*  Clinical Data: Fever, diabetes  CHEST - 2 VIEW  Comparison: None.  Findings: Cardiomediastinal silhouette is unremarkable.  No acute infiltrate or pleural effusion.  No pulmonary edema.  Bony thorax is unremarkable.  IMPRESSION: No active disease.   Original Report Authenticated By: Natasha Mead, M.D.    Dg Ankle Complete Left  01/02/2012  *RADIOLOGY REPORT*  Clinical Data: Ankle region soft tissue ulcer  LEFT ANKLE COMPLETE - 3+ VIEW  Comparison: None.  Findings: Frontal, lateral, and oblique views were obtained.  There is no fracture or effusion.  Ankle mortise  appears intact.  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  No erosive change or bony destruction.  No soft tissue abscess seen.  IMPRESSION: Osteoarthritic change.  No fracture or joint effusion. No bony destruction.  No soft tissue abscess appreciated.   Original Report Authenticated By: Bretta Bang, M.D.    Mr Foot Left W Wo Contrast  01/02/2012  *RADIOLOGY REPORT*  Clinical Data: Blistering bottom of foot.  Prior amputations. Diabetes.  MRI OF THE LEFT FOREFOOT WITHOUT AND WITH CONTRAST  Technique:  Multiplanar, multisequence MR imaging was performed both before and after administration of intravenous contrast.  Contrast: 20mL MULTIHANCE GADOBENATE DIMEGLUMINE 529 MG/ML IV SOLN  Comparison: 01/02/2012 radiographs  Findings: Prior amputations of the first and second toes at the MTP joints observed.  Extensive irregular abscess noted dorsal and distal to the first and second metatarsals, with abnormal but low-level edema in the second metatarsal favoring chronic osteomyelitis, and deformity the head of the second metatarsal.  There is deformity, irregularity, and erosion of the heads of the third and fourth metatarsals which could be from the erosive arthropathy or fracture, but with osteomyelitis also considered a possibility.  However, there is little in the way of edema in the adjacent third and fourth metatarsal shafts, and no phalangeal osteomyelitis is observed.  As expected, there is extensive surrounding infiltrative subcutaneous edema and enhancement particularly around the abscesses but also tracking in the dorsum of the foot.  Gas tracks within the dorsal abscess and within the distal portions of the abscess, and I suspect draining sinus tracts along the amputation bed of the first and second toes.  On image 29 of series 10, draining sinus tract to the medial ball of the foot is also suggested.  Lisfranc ligament is intact.  Visualized midfoot structures intact.  Edema and  enhancement tracks along the plantar musculature of the foot.  IMPRESSION:  1.  Considerable geographic abscess dorsal to the first and second metatarsals, tracking in the thickened and enhancing soft tissues, and also extending distal to the head of the first and second metatarsals.  There is probably some drainage along the amputation bed, and there is considerable gas within the abscess. 2.  Low-level edema and enhancement in the second metatarsal, favoring low-level osteomyelitis. 3.  Deformities and erosions of the heads of the third and fourth metatarsals, chronic since June 2013, potentially related to osteomyelitis, the erosive arthropathy, or prior fractures. However, there is not currently significant edema tracks in the shaft of the third and fourth metatarsals.  4.  Extensive regional cellulitis, especially dorsally. 5.  Edema and enhancement tracking along the plantar musculature of the foot, compatible  with myositis/fasciitis   Original Report Authenticated By: Gaylyn Rong, M.D.    Dg Foot Complete Left  01/02/2012  *RADIOLOGY REPORT*  Clinical Data: Left foot ulcer.  LEFT FOOT - COMPLETE 3+ VIEW  Comparison: July 28, 2011.  Findings: Status post amputation of first and second toes. Chronic changes of the distal third and fourth metatarsals are noted which are unchanged compared to prior exam.  These most likely due to prior osteomyelitis. Swelling and gas is noted in the dorsal soft tissues overlying the metatarsals consistent with infection.  IMPRESSION: Findings consistent with cellulitis or ulcer involving the dorsal soft tissues.  No definite evidence of acute osteomyelitis is noted.   Original Report Authenticated By: Lupita Raider.,  M.D.     Admission HPI: 41 yo M with PMH DM II with multiple poorly healing foot ulcers, HTN, and HLD, who presents to the ED with worsening left foot ulceration and fluctuance. He states that in May he had a poorly healing plantar foot ulcer and  required a diabetic pressure boot. The wound began to heal, but in September, it began worsening. He started wearing the boot again around this time. He states that just over the past day or 2 he developed a large ulcer on the dorsum of his foot that has worsened. His foot has become red, tender to palpation, and has been draining purulent material. He is supposed to be on insulin, but has not able to afford his medications for the past year. He is unsure what his blood sugars have been running. 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 Denies any fever, chills, nausea or vomiting.   Admission Physical Exam:  Blood pressure 155/83, pulse 72, temperature 97.9 F (36.6 C), temperature source Oral, resp. rate 18, SpO2 99.00%.  General: Alert & oriented x 3, well-developed, and cooperative on examination.  Head: Normocephalic and atraumatic.  Eyes: PERRL, EOMI, no injection and anicteric.  Mouth: Pharynx pink and moist.  Neck: Supple, full ROM.  Lungs: CTAB, normal respiratory effort, no accessory muscle use, no crackles, and no wheezes. Heart: Regular rate, regular rhythm, no murmur, no gallop, and no rub.  Abdomen: Soft, non-tender, non-distended, no guarding, no rebound tenderness, no organomegaly.  Msk: No joint swelling, warmth, or erythema.  Extremities: Left foot with erythema TTP, 4-5 circumferential eschar on dorsum of foot. Fluctuance in tissue around eschar with large blister proximal to left 5th toe. Fluctuance at stump of left foot. 1-2cm plantar ulcer the the 1st toe region, does not appear to reach the bone. 2+ radial and DP pulses bilaterally. No cyanosis, clubbing, edema Neurologic: Alert & oriented X3, cranial nerves II-XII intact, strength normal in all extremities. Decreased sensation in left foot.  Psych: Memory intact for recent and remote, normally interactive, good eye contact, not anxious appearing,  and not depressed appearing.   Hospital Course by problem list:  1. 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 Blood Cultures and a superficial swap culture of the Left foot were obtained.  Xrays of the left foot were obtained that did not reveal osteomyelitis. A MRI was subsequently obtained that showed low grade osteomyelitis of the 2nd metatarsal.  Dr. Hewitt/Orthopedic surgery was consulted, as he had performed the patients previous amputations. Austin Mcdaniel was empirically started on Vancomycin and Zosyn with Wound Care consultation pending possible surgical intervention. By [employer]  Day #5 he underwent left Below Knee Amputation. Patient tolerated procedure well.  He was evaluated by Physical Medicine and Rehabilitation and discharged with Home Health PT/OT Services including walker, bedside commode, and shower seat.  2. Acute Kidney Injury:  On admission the patient had a creatinine of 1.96 and a BUN of 39.  Urine studies were obtained that revealed a Fractional Excretion of Sodium of 0.34% and a Urine osmoles of 482, indicating likely pre-renal etiology thought to be due to dehydration secondary to uncontrolled hyperglycemia.  He was started on IV fluids.  Creatinine rose to 2.18 and then trended down to 1.53 by day of discharge.  His creatinine level last year was 1.0 but has had uncontrolled diabetes, hypertension and his renal function likely has become worse.  Patient was then restarted on lisinopril for blood pressure control and its renal protective benefit.  Regular outpatient follow-up and adherence with insulin and hypertensive therapy was re-emphasized by day of discharge  3. Hyponatremia: Patient presented with a Corrected Sodium level of 133.2.  Normal Saline IV fluids were started and the hyponatremia resolved.  Lab 01/08/12 0610  NA 136    4. Type II Diabetes Mellitus, poorly controlled with neuro-vascular  complications:  On admission patient had blood glucose of 423, he reported not taking insulin for over a year due to financial concerns.  A HgbA1c obtained was 14.5 indicative of poor glycemic control.  Patient's glucose was slowly brought towards goal with titration of long acting insulin, short acting insulin with meals and a sliding scale protocol.  Dosage was adjusted until control was reached.  A diabetes educator was consulted and recommended to switch to only 75/25 insulin to increase compliance with the patient secondary to financial strains.  He was discharged on Humalog 75/25 14 units twice a day with meals.  With follow-up with Internal medicine Clinic scheduled for 01/15/12.  5. Anxiety: Patient reported generalized anxiety concerning his ability to maintain work and in regards to his impending surgery (below knee amputation). He responded well to IV Ativan x1, and was started on citalopram 20 mg daily.  He was told of importance to follow up with PCP as an outpatient and should be evaluated for further need to increase dosage.    6. Hypertension:  Patient presented with elevated blood pressures 155/83 with pulse 72 bpm.  He was previously discharged on lisinopril 10 mg daily thus was resumed on this agent during this admission. Further titration upwards of lisinopril was limited by his acute kidney injury with creatinine levels ~ 1.5 thus he will need further assessment of his hypertensive regimen at follow-up with PCP.   7. Hyperlipidemia:  Patient with multiple risk factors.  Lipid panel demonstrated LDL of 132 and HDL of 10.  Patient was started on pravastatin 20 mg and should be reassessed at follow-up for side effects and need for titration.   Discharge Vitals:  BP 179/87  Pulse 76  Temp 98.2 F (36.8 C) (Oral)  Resp 18  Ht 6\' 1"  (1.854 m)  Wt 225 lb (102.059 kg)  BMI 29.69 kg/m2  SpO2 96%  Discharge Labs:  Results for orders placed during the hospital encounter of 01/02/12  (from the past 24 hour(s))  GLUCOSE, CAPILLARY     Status: Abnormal   Collection Time   01/07/12  4:24 PM      Component Value Range   Glucose-Capillary 161 (*) 70 - 99 mg/dL   Comment 1 Notify RN    GLUCOSE, CAPILLARY  Status: Abnormal   Collection Time   01/07/12 10:14 PM      Component Value Range   Glucose-Capillary 145 (*) 70 - 99 mg/dL  BASIC METABOLIC PANEL     Status: Abnormal   Collection Time   01/08/12  6:10 AM      Component Value Range   Sodium 136  135 - 145 mEq/L   Potassium 4.3  3.5 - 5.1 mEq/L   Chloride 102  96 - 112 mEq/L   CO2 26  19 - 32 mEq/L   Glucose, Bld 130 (*) 70 - 99 mg/dL   BUN 17  6 - 23 mg/dL   Creatinine, Ser 4.40 (*) 0.50 - 1.35 mg/dL   Calcium 8.6  8.4 - 10.2 mg/dL   GFR calc non Af Amer 55 (*) >90 mL/min   GFR calc Af Amer 64 (*) >90 mL/min  CBC     Status: Abnormal   Collection Time   01/08/12  6:10 AM      Component Value Range   WBC 9.3  4.0 - 10.5 K/uL   RBC 2.93 (*) 4.22 - 5.81 MIL/uL   Hemoglobin 8.8 (*) 13.0 - 17.0 g/dL   HCT 72.5 (*) 36.6 - 44.0 %   MCV 87.4  78.0 - 100.0 fL   MCH 30.0  26.0 - 34.0 pg   MCHC 34.4  30.0 - 36.0 g/dL   RDW 34.7  42.5 - 95.6 %   Platelets 348  150 - 400 K/uL  GLUCOSE, CAPILLARY     Status: Abnormal   Collection Time   01/08/12  7:02 AM      Component Value Range   Glucose-Capillary 134 (*) 70 - 99 mg/dL  GLUCOSE, CAPILLARY     Status: Abnormal   Collection Time   01/08/12 11:15 AM      Component Value Range   Glucose-Capillary 154 (*) 70 - 99 mg/dL   Comment 1 Documented in Chart     Comment 2 Notify RN      Signed: Kristie Cowman 01/08/2012, 2:00 PM   Time Spent on Discharge: 60 min Services Ordered on Discharge: Home Health PT, Home Health OT Equipment Ordered on Discharge: rolling walker, tub/shower seat, bedside commode

## 2012-01-08 NOTE — Progress Notes (Signed)
Subjective: 2 Days Post-Op Procedure(s) (LRB): AMPUTATION BELOW KNEE (Left) Patient reports pain as mild.  No c/o.  Wants to go home v. Rehab.  Objective: Vital signs in last 24 hours: Temp:  [98.2 F (36.8 C)] 98.2 F (36.8 C) (11/13 1400) Pulse Rate:  [74-79] 76  (11/14 0701) Resp:  [18] 18  (11/14 0701) BP: (152-179)/(85-87) 179/87 mmHg (11/14 0701) SpO2:  [94 %-96 %] 96 % (11/14 0701)  Intake/Output from previous day: 11/13 0701 - 11/14 0700 In: 4500 [P.O.:1200; I.V.:3300] Out: 700 [Urine:700] Intake/Output this shift:     Basename 01/08/12 0610 01/07/12 1147 01/06/12 0830 01/05/12 1909  HGB 8.8* 9.5* 9.5* 9.9*    Basename 01/08/12 0610 01/07/12 1147  WBC 9.3 10.5  RBC 2.93* 3.18*  HCT 25.6* 27.7*  PLT 348 384    Basename 01/08/12 0610 01/07/12 1147  NA 136 135  K 4.3 4.4  CL 102 100  CO2 26 25  BUN 17 21  CREATININE 1.53* 1.62*  GLUCOSE 130* 210*  CALCIUM 8.6 8.8     L LE dressed and dry.  Knee immobilizer in place.  Assessment/Plan: 2 Days Post-Op Procedure(s) (LRB): AMPUTATION BELOW KNEE (Left)  From my perspective pt can d/c home at any time with [facility] PT.  He is NWB on the L LE.  Pain med rxs written and on the chart.  D/c instructions in epic.  F/u in struction in epic.    I'll sign off.  Call [phone] with questions.   Toni Arthurs 01/08/2012, 1:43 PM

## 2012-01-08 NOTE — Progress Notes (Signed)
Occupational Therapy Discharge Patient Details Name: Ivar Domangue MRN: [id number] DOB: 28-Jun-1970 Today's Date: 01/08/2012 Time: 4098-1191 OT Time Calculation (min): 23 min  Patient discharged from OT services secondary to goals met and no further OT needs identified.  Please see latest therapy progress note for current level of functioning and progress toward goals.    Progress and discharge plan discussed with patient and/or caregiver: Patient/Caregiver agrees with plan  GO     Cleora Fleet 01/08/2012, 2:57 PM

## 2012-01-08 NOTE — Progress Notes (Signed)
I agree with the following treatment note after reviewing documentation.   Johnston, Jaimon Bugaj Brynn   OTR/L Pager: [phone] Office: [phone] .   

## 2012-01-12 DIAGNOSIS — Z89519 Acquired absence of unspecified leg below knee: Secondary | ICD-10-CM

## 2012-01-12 DIAGNOSIS — M869 Osteomyelitis, unspecified: Secondary | ICD-10-CM | POA: Diagnosis present

## 2012-01-15 ENCOUNTER — Ambulatory Visit (INDEPENDENT_AMBULATORY_CARE_PROVIDER_SITE_OTHER): Payer: No Typology Code available for payment source | Admitting: Internal Medicine

## 2012-01-15 ENCOUNTER — Encounter: Payer: Self-pay | Admitting: Internal Medicine

## 2012-01-15 VITALS — BP 154/86 | HR 74 | Temp 97.8°F | Ht 73.0 in | Wt 238.1 lb

## 2012-01-15 DIAGNOSIS — I10 Essential (primary) hypertension: Secondary | ICD-10-CM

## 2012-01-15 DIAGNOSIS — Z89519 Acquired absence of unspecified leg below knee: Secondary | ICD-10-CM

## 2012-01-15 DIAGNOSIS — E1159 Type 2 diabetes mellitus with other circulatory complications: Secondary | ICD-10-CM

## 2012-01-15 DIAGNOSIS — S88119A Complete traumatic amputation at level between knee and ankle, unspecified lower leg, initial encounter: Secondary | ICD-10-CM

## 2012-01-15 DIAGNOSIS — I798 Other disorders of arteries, arterioles and capillaries in diseases classified elsewhere: Secondary | ICD-10-CM

## 2012-01-15 MED ORDER — INSULIN ASPART PROT & ASPART (70-30 MIX) 100 UNIT/ML ~~LOC~~ SUSP: SUBCUTANEOUS | Status: DC

## 2012-01-15 NOTE — Progress Notes (Signed)
  Subjective:    Patient ID: Austin Mcdaniel, male    DOB: Oct 28, 1970, 41 y.o.   MRN: [id number]  HPI patient is a pleasant 41 year old man with uncontrolled DM 2, hypertension, recent left below-knee amputation and other medical problems as per problem list who comes to clinic for hospital followup visit. He was discharged from hospital on 01/08/2012 after left BKA. He had 2 toe amputations in 2009 and 2011. This time he got osteomyelitis and to have left BKA. He recovered very well from it. He was seen by his orthopedic surgeon yesterday and got a positive report. He had some redness to the wound and was asked to elevate his leg more. The wound care nurse will come to his house tomorrow. He also has home health physical therapy started. He has an appointment with orthotics today. Repeat appointment with orthopedics in 2 weeks. He was discharged on Humalog 70/25 Which was around $170 and so he was wondering if there is a cheaper option. I sent a prescription for NovoLog 70/30 which would be around $28. If not they will call back with a cheaper option from pharmacy.  He says that his sugars are running in low 100's since hospital D/C and he is checking it 2 times a day. The highest was in 130's. He feels much better overall.  He denies any fever, chills, nausea, vomiting, abdominal pain, chest pain, short of breath, diarrhea.   Review of Systems    As per history of present illness.  Objective:   Physical Exam  General: NAD, in wheelchair. HEENT: PERRL, EOMI, no scleral icterus Cardiac: S1, S2, RRR, no rubs, murmurs or gallops Pulm: clear to auscultation bilaterally, moving normal volumes of air Abd: soft, nontender, nondistended, BS present Ext: warm and well perfused, no pedal edema. Left BKA- dressing in place. Neuro: alert and oriented X3, cranial nerves II-XII grossly intact        Assessment & Plan:

## 2012-01-15 NOTE — Patient Instructions (Signed)
Please make a followup appointment in 6-8 weeks. Continue physical therapy, wound care and orthopedic followup.  Pick the prescription for NovoLog 70/30- 14 units twice a day from [facility] which should be cheaper, and if it's not call the clinic with the cheaper option so I can send a prescription to the pharmacy.  Lupita Leash Plyler, diabetes educator, will call you with appointment day and time about diabetes and nutrition education.

## 2012-01-15 NOTE — Assessment & Plan Note (Signed)
Healing well. Evaluated by orthopedics yesterday. Followup appointment with him in 2 weeks. Home health Wound care, physical therapy started.  Encouraged him to continue all of this.

## 2012-01-15 NOTE — Assessment & Plan Note (Signed)
Lab Results  Component Value Date   HGBA1C 14.5* 01/03/2012   CREATININE 1.53* 01/08/2012   CREATININE 1.02 07/01/2010   CHOL 198 01/02/2012   HDL 10* 01/02/2012   TRIG 280* 01/02/2012    Last eye exam and foot exam: No results found for this basename: HMDIABEYEEXA, HMDIABFOOTEX    Assessment: Diabetes control: not controlled Progress toward goals: unchanged Barriers to meeting goals: nonadherence to medications  Plan: Diabetes treatment: continue current medications. Sent prescription of NovoLog 70/30 which might be cheaper, if not we will call him and sent prescription of which were cheaper to [facility]. Refer to: diabetes educator for self-management training, diabetes educator for medical nutrition therapy and nutritionist Instruction/counseling given: reminded to bring blood glucose meter & log to each visit, reminded to bring medications to each visit and discussed foot care

## 2012-01-15 NOTE — Assessment & Plan Note (Signed)
Lab Results  Component Value Date   NA 136 01/08/2012   K 4.3 01/08/2012   CL 102 01/08/2012   CO2 26 01/08/2012   BUN 17 01/08/2012   CREATININE 1.53* 01/08/2012   CREATININE 1.02 07/01/2010    BP Readings from Last 3 Encounters:  01/15/12 154/86  01/08/12 166/84  01/08/12 166/84    Assessment: Hypertension control:  mildly elevated  Progress toward goals:  improved Barriers to meeting goals:  no barriers identified  Plan: Hypertension treatment:  continue current medications. Continue lisinopril 10 mg daily. His blood pressure still high during next visit consider increasing to 20 mg daily.

## 2012-01-16 ENCOUNTER — Telehealth: Payer: Self-pay | Admitting: *Deleted

## 2012-01-16 LAB — BASIC METABOLIC PANEL WITH GFR
Chloride: 103 mEq/L (ref 96–112)
Creat: 1.59 mg/dL — ABNORMAL HIGH (ref 0.50–1.35)
GFR, Est African American: 61 mL/min
GFR, Est Non African American: 53 mL/min — ABNORMAL LOW
Potassium: 4.5 mEq/L (ref 3.5–5.3)

## 2012-01-16 NOTE — Telephone Encounter (Signed)
Pt's sig other calls and states that insulin needs to be novolin for it to be affordable

## 2012-01-17 ENCOUNTER — Other Ambulatory Visit: Payer: Self-pay | Admitting: Internal Medicine

## 2012-01-17 DIAGNOSIS — E1159 Type 2 diabetes mellitus with other circulatory complications: Secondary | ICD-10-CM

## 2012-01-17 MED ORDER — INSULIN NPH ISOPHANE & REGULAR (70-30) 100 UNIT/ML ~~LOC~~ SUSP: SUBCUTANEOUS | Status: DC

## 2012-02-04 ENCOUNTER — Encounter: Payer: Self-pay | Admitting: Dietician

## 2012-02-04 ENCOUNTER — Ambulatory Visit (INDEPENDENT_AMBULATORY_CARE_PROVIDER_SITE_OTHER): Payer: No Typology Code available for payment source | Admitting: Dietician

## 2012-02-04 DIAGNOSIS — I798 Other disorders of arteries, arterioles and capillaries in diseases classified elsewhere: Secondary | ICD-10-CM

## 2012-02-04 DIAGNOSIS — E1159 Type 2 diabetes mellitus with other circulatory complications: Secondary | ICD-10-CM

## 2012-02-04 NOTE — Progress Notes (Signed)
Diabetes Self-Management Training (DSMT)  Initial Visit  02/04/2012 [name], identified by name and date of birth, is a 41 y.o. male with Type 2 Diabetes. Year of diabetes diagnosis: 1996-started on metformin, insulin last few years Other persons present: spouse/SO  ASSESSMENT Patient concerns are Nutrition/meal planning and Glycemic control.  There were no vitals taken for this visit. There is no height or weight on file to calculate BMI. Lab Results  Component Value Date   LDLCALC 132* 01/02/2012   Lab Results  Component Value Date   HGBA1C 14.5* 01/03/2012     Labs reviewed with patient at his request. Deferred questions to his physician.  Family history of diabetes: Not assessed Patients belief/attitude about diabetes: Diabetes can be controlled. Self foot exams daily: Yes Diabetes Complications: Neuropathy Support systems: significant other Special needs: None Prior DM Education: Yes   Medications See Medications list.  Is interested in learning more and Needs skills/knowledge review   Exercise Plan Doing ADLs and leg exercises, some pilates and gonig to begin PT soon for 30 minutesa day.   Self-Monitoring   Monitor: [employer] meter strips cost 9 $ Frequency of testing: 1-2 times/day Breakfast: 97-150 Lunch: 77 today in office after late breakfast and no lunch Hyperglycemia: No Hypoglycemia: No   Meal Planning Some knowledge   Assessment comments: patient and significant other very supportive of each other in their diabetes care. Patient diet recall deficient on fruits and vegetables, whole grains. S/P left BKA, suggested optimal blood sugar control, consistency in insulin, food and activity and increased self monitoring to improve his diabetes self management   INDIVIDUAL DIABETES EDUCATION PLAN:  Nutrition management Medication Monitoring Acute  complication: _______________________________________________________________________  Intervention TOPICS COVERED TODAY:  Nutrition management  Role of diet in the treatment of diabetes and the relationship between the three main macronutritents and blood glucose control. Meal timing in regards to the patients' current diabetes medication. Medication  Reviewed patients medication for diabetes, action, purpose, timing of dose and side effects. Acute complication  Taught treatment of hypoglycemia - the 15 rule.  PATIENTS GOALS/PLAN (copy and paste in patient instructions so patient receives a copy): 1.  Learning Objective:       State action of his own type of insulin 2.  Behavioral Objective:         Medications: To improve blood glucose levels, I will take my medication as prescribed Half of the time 50%- has been taking PM insulin after dinner  Personalized Follow-Up Plan for Ongoing Self Management Support:  Doctor's Office, friends, family and CDE visits ______________________________________________________________________   Outcomes Expected outcomes: Demonstrated interest in learning.Expect positive changes in lifestyle. Self-care Barriers: Lack of material resources Education material provided: yes Patient to contact team via Phone if problems or questions.  Time in: 1330     Time out: 1500  Future DSMT - 4-6 wks   Plyler, Lupita Leash

## 2012-02-13 ENCOUNTER — Ambulatory Visit: Payer: Self-pay

## 2012-03-03 ENCOUNTER — Encounter: Payer: Self-pay | Admitting: Internal Medicine

## 2012-03-24 ENCOUNTER — Encounter: Payer: Self-pay | Admitting: Internal Medicine

## 2012-03-24 ENCOUNTER — Encounter: Payer: Self-pay | Admitting: Dietician

## 2012-05-12 ENCOUNTER — Ambulatory Visit: Payer: PRIVATE HEALTH INSURANCE | Attending: Orthopedic Surgery | Admitting: Physical Therapy

## 2012-05-12 ENCOUNTER — Encounter: Payer: Self-pay | Admitting: Internal Medicine

## 2012-05-12 ENCOUNTER — Ambulatory Visit (INDEPENDENT_AMBULATORY_CARE_PROVIDER_SITE_OTHER): Payer: No Typology Code available for payment source | Admitting: Internal Medicine

## 2012-05-12 ENCOUNTER — Ambulatory Visit (INDEPENDENT_AMBULATORY_CARE_PROVIDER_SITE_OTHER): Payer: No Typology Code available for payment source | Admitting: Dietician

## 2012-05-12 VITALS — BP 159/106 | HR 84 | Temp 97.8°F | Ht 73.0 in | Wt 246.8 lb

## 2012-05-12 DIAGNOSIS — E785 Hyperlipidemia, unspecified: Secondary | ICD-10-CM

## 2012-05-12 DIAGNOSIS — Z4789 Encounter for other orthopedic aftercare: Secondary | ICD-10-CM | POA: Insufficient documentation

## 2012-05-12 DIAGNOSIS — R5381 Other malaise: Secondary | ICD-10-CM | POA: Insufficient documentation

## 2012-05-12 DIAGNOSIS — R269 Unspecified abnormalities of gait and mobility: Secondary | ICD-10-CM | POA: Insufficient documentation

## 2012-05-12 DIAGNOSIS — I10 Essential (primary) hypertension: Secondary | ICD-10-CM

## 2012-05-12 DIAGNOSIS — G4700 Insomnia, unspecified: Secondary | ICD-10-CM

## 2012-05-12 DIAGNOSIS — IMO0001 Reserved for inherently not codable concepts without codable children: Secondary | ICD-10-CM | POA: Insufficient documentation

## 2012-05-12 DIAGNOSIS — E1159 Type 2 diabetes mellitus with other circulatory complications: Secondary | ICD-10-CM

## 2012-05-12 LAB — POCT GLYCOSYLATED HEMOGLOBIN (HGB A1C): Hemoglobin A1C: 6.9

## 2012-05-12 MED ORDER — INSULIN NPH ISOPHANE & REGULAR (70-30) 100 UNIT/ML ~~LOC~~ SUSP: SUBCUTANEOUS | Status: DC

## 2012-05-12 MED ORDER — ZOLPIDEM TARTRATE 10 MG PO TABS: 10.0000 mg | ORAL_TABLET | Freq: Every evening | ORAL | Status: DC | PRN

## 2012-05-12 MED ORDER — LISINOPRIL 10 MG PO TABS: 10.0000 mg | ORAL_TABLET | Freq: Every day | ORAL | Status: DC

## 2012-05-12 NOTE — Progress Notes (Signed)
  Subjective:    Patient ID: Austin Mcdaniel, male    DOB: 1970/09/18, 42 y.o.   MRN: [id number]  HPI patient is a pleasant 42 year old man with DM 2 with complication, recent BKA in November 2013, hypertension and other problems as per problem list comes the clinic for followup visit for his diabetes and other problems. He reports doing pretty well after his amputation. He goes to the Amputee classes at [facility] which are frequent he likes it. He also works with home physical therapy. Says that his foot exam was done today by physical therapy. He did get his orthotics about 2 weeks before and is wearing it and doing well with it. He complains of insomnia- which she had for a long time, but is now getting worse.  He describes problem initiating sleep due to multiple thoughts. He had tried citalopram while in hospital but he did not help much.   He denies any fever, chills, nausea, vomiting, abdominal pain, chest pain, short of breath, diarrhea.   Review of Systems    as per history of present illness Objective:   Physical Exam  General: NAD HEENT: PERRL, EOMI, no scleral icterus Cardiac: RRR, no rubs, murmurs or gallops Pulm: clear to auscultation bilaterally, moving normal volumes of air Abd: soft, nontender, nondistended, BS present Ext: Left below-knee amputation healed well. Orthotics in place. No pedal edema on right lower extremity. Neuro: alert and oriented X3, cranial nerves II-XII grossly intact       Assessment & Plan:

## 2012-05-12 NOTE — Patient Instructions (Signed)
Please make followup appointment in 2-3 months. Continue taking all medications as we discussed. Written taking NovoLog 14 units twice daily. If your sugars drop below 70 hold insulin and give [place] a call for further instructions. Your A1c today is 6.9- which is much better and so we might soon need to decrease the dose of insulin.\ Start taking Ambien 10 mg about 30 minutes before sleep. Start taking lisinopril 10 mg daily.

## 2012-05-12 NOTE — Progress Notes (Signed)
Diabetes Self-Management Education  Visit Number: Follow-up 1  05/12/2012 Austin Mcdaniel, identified by name and date of birth, is a 42 y.o. male with  Type 2 diabetes.  Other people present during visit:    No  ASSESSMENT  Patient Concerns:  Review and complete education  Lab Results: LDL Cholesterol  Date Value Range Status  01/02/2012 132* 0 - 99 mg/dL Final    Hemoglobin J1B  Date Value Range Status  05/12/2012 6.9   Final  01/03/2012 14.5* <5.7 % Final     (NOTE)     No family history on file. History  Substance Use Topics  . Smoking status: Current Some Day Smoker -- 0.10 packs/day for 7 years    Types: Cigars  . Smokeless tobacco: Current User    Types: Snuff     Comment: 01/03/2012 "quit cigarettes 15-20 yr ago; occasionally smoke a pipe; dip rarely - last time ~ 3 months ago"  . Alcohol Use: 1.2 oz/week    2 Cans of beer per week     Comment: 01/03/2012 "couple beers once/wk"    Support Systems:    Special Needs:     Prior DM Education:   Daily Foot Exams:   Patient Belief / Attitude about Diabetes:     Assessment comments: patient reports doing well with insulin, amputation and blood sugars. Concerning is his 20# weight gain. Encouraged intensive blood sugar checking prior to next visit   Diet Recall: 3 meals & 3 snacks- mostly healthy foods and not fried  Individualized Plan for Diabetes Self-Management Training:  Patient individualized diabetes plan includes:  Nutrition, medications, monitoring, how to handle highs and lows,   Education Topics Reviewed with Patient Today:  Topic Points Discussed  Disease State    Nutrition Management    Physical Activity and Exercise Identified with patient nutritional and/or medication changes necessary with exercise.  Medications Reviewed patients medication for diabetes, action, purpose, timing of dose and side effects.  Monitoring    Acute Complications Discussed and identified patients' treatment of  hyperglycemia.  Chronic Complications Relationship between chronic complications and blood glucose control  Psychosocial Adjustment    Goal Setting    Preconception Care (if applicable)      PATIENTS GOALS   Learning Objective(s):     Goal The patient agrees to:  Nutrition    Physical Activity    Medications    Monitoring    Problem Solving    Reducing Risk Other (comment)  Health Coping     Patient Self-Evaluation of Goals (Subsequent Visits)  Goal The patient rates self as meeting goals (% of time)  Nutrition    Physical Activity    Medications >75%  Monitoring    Problem Solving    Reducing Risk    Health Coping       PERSONALIZED PLAN / SUPPORT  Self-Management Support:  Doctor's office;Support group;Friends;Family;Internet communities;CDE visits  Goal Evaluation:  Most of the time 75% ______________________________________________________________________  Outcomes  Expected Outcomes:  Demonstrated interest in learning. Expect positive outcomes Self-Care Barriers:  Lack of material resources Education material provided: yes- sheet so different types of insulin and diabetes care card If problems or questions, patient to contact team via:  Phone and Email Time in: 1500     Time out: 1530  Future DSME appointment: - Yearly   Adna Nofziger, Lupita Leash 05/12/2012 3:55 PM

## 2012-05-12 NOTE — Assessment & Plan Note (Signed)
Lab Results  Component Value Date   HGBA1C 6.9 05/12/2012   HGBA1C 14.5* 01/03/2012   HGBA1C  Value: 15.2 (NOTE)                                                                       According to the ADA Clinical Practice Recommendations for 2011, when HbA1c is used as a screening test:   >=6.5%   Diagnostic of Diabetes Mellitus           (if abnormal result  is confirmed)  5.7-6.4%   Increased risk of developing Diabetes Mellitus  References:Diagnosis and Classification of Diabetes Mellitus,Diabetes Care,2011,34(Suppl 1):S62-S69 and Standards of Medical Care in         Diabetes - 2011,Diabetes Care,2011,34  (Suppl 1):S11-S61.* 06/18/2010     Assessment:  Diabetes control:   very good  Progress toward A1C goal:    improved  Comments: Exceptional improvement in A1c from 14.5 to 6.9.  Plan:  Medications:  continue current medications. Continue NovoLog 14 units twice daily  Home glucose monitoring:   Frequency:   twice daily   Timing:   before breakfast and dinner  Instruction/counseling given: reminded to get eye exam, reminded to bring blood glucose meter & log to each visit, reminded to bring medications to each visit and discussed foot care  Educational resources provided: brochure;handout  Self management tools provided:    Other plans: He talked with Lupita Leash today. Will refer him to ophthalmology for eye exam. Will check urine microalbumin to creatinine ratio. He will call if his sugars drop below 70 in which case we have to decrease the dose of insulin.

## 2012-05-12 NOTE — Assessment & Plan Note (Signed)
Good recovery after left BKA. Goes to [employer] [facility] [employer]. Gets home health physical therapy.  Still has about 10 weeks ago before he can start full-time job.

## 2012-05-12 NOTE — Assessment & Plan Note (Signed)
Problem initiate sleep due to multiple thoughts. He has tried melatonin, Tylenol PM, Advil PM without much help. Discussed with him about sleep hygiene. Will give him to trial of Ambien 10 mg each bedtime.

## 2012-05-12 NOTE — Assessment & Plan Note (Signed)
BP Readings from Last 3 Encounters:  05/12/12 159/106  01/15/12 154/86  01/08/12 166/84    Lab Results  Component Value Date   NA 141 01/15/2012   K 4.5 01/15/2012   CREATININE 1.59* 01/15/2012    Assessment:  Blood pressure control:   moderately elevated  Progress toward BP goal:    worsening  Comments: Patient not on any blood pressure medication.  Plan:  Medications:  Start back on lisinopril 10 mg daily.  Educational resources provided: Psychologist, counselling tools provided:    Other plans:

## 2012-05-12 NOTE — Assessment & Plan Note (Signed)
-  Continue Pravachol ?

## 2012-05-13 LAB — MICROALBUMIN / CREATININE URINE RATIO
Microalb Creat Ratio: 2403.3 mg/g — ABNORMAL HIGH (ref 0.0–30.0)
Microalb, Ur: 239.13 mg/dL — ABNORMAL HIGH (ref 0.00–1.89)

## 2012-05-17 ENCOUNTER — Ambulatory Visit: Payer: PRIVATE HEALTH INSURANCE | Admitting: Physical Therapy

## 2012-05-19 ENCOUNTER — Ambulatory Visit: Payer: PRIVATE HEALTH INSURANCE | Admitting: Physical Therapy

## 2012-05-24 ENCOUNTER — Ambulatory Visit: Payer: PRIVATE HEALTH INSURANCE | Admitting: Physical Therapy

## 2012-05-26 ENCOUNTER — Ambulatory Visit: Payer: PRIVATE HEALTH INSURANCE | Attending: Orthopedic Surgery | Admitting: Physical Therapy

## 2012-05-26 ENCOUNTER — Telehealth: Payer: Self-pay | Admitting: *Deleted

## 2012-05-26 DIAGNOSIS — R5381 Other malaise: Secondary | ICD-10-CM | POA: Insufficient documentation

## 2012-05-26 DIAGNOSIS — IMO0001 Reserved for inherently not codable concepts without codable children: Secondary | ICD-10-CM | POA: Insufficient documentation

## 2012-05-26 DIAGNOSIS — R269 Unspecified abnormalities of gait and mobility: Secondary | ICD-10-CM | POA: Insufficient documentation

## 2012-05-26 DIAGNOSIS — Z4789 Encounter for other orthopedic aftercare: Secondary | ICD-10-CM | POA: Insufficient documentation

## 2012-05-26 NOTE — Telephone Encounter (Signed)
Pt calls and states he was viewing mychart and is concerned with his lab results from 3/19 the microalbumin especially, could you please call him and reassure him

## 2012-05-31 ENCOUNTER — Ambulatory Visit: Payer: PRIVATE HEALTH INSURANCE | Admitting: Physical Therapy

## 2012-06-01 ENCOUNTER — Encounter: Payer: Self-pay | Admitting: Internal Medicine

## 2012-06-01 DIAGNOSIS — R269 Unspecified abnormalities of gait and mobility: Secondary | ICD-10-CM | POA: Insufficient documentation

## 2012-06-07 ENCOUNTER — Ambulatory Visit: Payer: PRIVATE HEALTH INSURANCE | Admitting: Physical Therapy

## 2012-06-14 ENCOUNTER — Telehealth: Payer: Self-pay | Admitting: Internal Medicine

## 2012-06-14 ENCOUNTER — Encounter: Payer: PRIVATE HEALTH INSURANCE | Admitting: Physical Therapy

## 2012-06-14 NOTE — Telephone Encounter (Signed)
Reply call for patients call earlier this month for microalb/creat results. Discussed with him about 2.4 gm ratio and last albumin of 2.6 last year and renal out pt eval when he comes to clinic next month on 5/14. No overt volume overload. No leg edema, SOB. No changes in urinary habits. Pt verbalized understanding of the plan.

## 2012-06-14 NOTE — Telephone Encounter (Signed)
Called patient back and updated of plan.

## 2012-06-21 ENCOUNTER — Encounter: Payer: PRIVATE HEALTH INSURANCE | Admitting: Physical Therapy

## 2012-06-28 ENCOUNTER — Encounter: Payer: PRIVATE HEALTH INSURANCE | Admitting: Physical Therapy

## 2012-07-05 ENCOUNTER — Ambulatory Visit: Payer: PRIVATE HEALTH INSURANCE | Admitting: Physical Therapy

## 2012-07-07 ENCOUNTER — Ambulatory Visit (INDEPENDENT_AMBULATORY_CARE_PROVIDER_SITE_OTHER): Payer: No Typology Code available for payment source | Admitting: Internal Medicine

## 2012-07-07 ENCOUNTER — Encounter: Payer: Self-pay | Admitting: Internal Medicine

## 2012-07-07 VITALS — BP 182/95 | HR 81 | Temp 98.4°F | Ht 73.0 in | Wt 255.8 lb

## 2012-07-07 DIAGNOSIS — I798 Other disorders of arteries, arterioles and capillaries in diseases classified elsewhere: Secondary | ICD-10-CM

## 2012-07-07 DIAGNOSIS — E1159 Type 2 diabetes mellitus with other circulatory complications: Secondary | ICD-10-CM

## 2012-07-07 DIAGNOSIS — E1129 Type 2 diabetes mellitus with other diabetic kidney complication: Secondary | ICD-10-CM

## 2012-07-07 DIAGNOSIS — I10 Essential (primary) hypertension: Secondary | ICD-10-CM

## 2012-07-07 DIAGNOSIS — R809 Proteinuria, unspecified: Secondary | ICD-10-CM

## 2012-07-07 DIAGNOSIS — IMO0001 Reserved for inherently not codable concepts without codable children: Secondary | ICD-10-CM

## 2012-07-07 DIAGNOSIS — E785 Hyperlipidemia, unspecified: Secondary | ICD-10-CM

## 2012-07-07 LAB — LIPID PANEL
Cholesterol: 253 mg/dL — ABNORMAL HIGH (ref 0–200)
HDL: 29 mg/dL — ABNORMAL LOW (ref >39)
LDL Cholesterol: 175 mg/dL — ABNORMAL HIGH (ref 0–99)
Total CHOL/HDL Ratio: 8.7 Ratio
Triglycerides: 246 mg/dL — ABNORMAL HIGH (ref <150)
VLDL: 49 mg/dL — ABNORMAL HIGH (ref 0–40)

## 2012-07-07 LAB — COMPLETE METABOLIC PANEL WITH GFR
AST: 16 U/L (ref 0–37)
Albumin: 3.7 g/dL (ref 3.5–5.2)
BUN: 49 mg/dL — ABNORMAL HIGH (ref 6–23)
Calcium: 9.2 mg/dL (ref 8.4–10.5)
Chloride: 105 mEq/L (ref 96–112)
Creat: 1.92 mg/dL — ABNORMAL HIGH (ref 0.50–1.35)
GFR, Est Non African American: 42 mL/min — ABNORMAL LOW
Glucose, Bld: 77 mg/dL (ref 70–99)

## 2012-07-07 MED ORDER — LISINOPRIL 20 MG PO TABS: 20.0000 mg | ORAL_TABLET | Freq: Every day | ORAL | Status: DC

## 2012-07-07 MED ORDER — HYDROCHLOROTHIAZIDE 25 MG PO TABS: 25.0000 mg | ORAL_TABLET | Freq: Every day | ORAL | Status: DC

## 2012-07-07 MED ORDER — SIMVASTATIN 40 MG PO TABS: 40.0000 mg | ORAL_TABLET | Freq: Every evening | ORAL | Status: DC

## 2012-07-07 NOTE — Assessment & Plan Note (Signed)
Patient has nephrotic range proteinuria- 2.4 g daily. Most likely diabetic along with hypertensive pathology. - Last creatinine 1.59 in November 2013.  - Check CMP to evaluate creatinine and albumin.  - Will hold off nephrology referral for now- will make one if his creatinine is getting worse.

## 2012-07-07 NOTE — Assessment & Plan Note (Signed)
BP Readings from Last 3 Encounters:  07/07/12 182/95  05/12/12 159/106  01/15/12 154/86    Lab Results  Component Value Date   NA 141 01/15/2012   K 4.5 01/15/2012   CREATININE 1.59* 01/15/2012    Assessment: Blood pressure control:   systolic blood pressure severely elevated Progress toward BP goal:    worsening Comments:   Plan: Medications:  Increase the dose of lisinopril to 20 mg daily and add HCTZ 25 mg daily Educational resources provided: brochure;handout;video Self management tools provided:   Other plans: Followup in 4-5 weeks. Goal is to maximize lisinopril and probably add other blood pressure medications as needed.

## 2012-07-07 NOTE — Assessment & Plan Note (Signed)
Last LDL 137 in  November 2013 and HDL severely low - 10. Unclear etiology of such low HDL. - Repeat lipid panel today. - If HDL is still low, we'll consider getting an input from specialist. - Change Pravachol to simvastatin 40 mg daily- which will help with HDL and also he needs high intensity statin.

## 2012-07-07 NOTE — Assessment & Plan Note (Signed)
Multiple Hypoglycemic spells in 60s since last visit. Did not bring his glucose meter today. Asked him to come by and bring the glucose meter so we can download and review. - Meanwhile decrease the dose of NPH to 10 units twice daily from 14 twice daily. Reevaluate in 4-5 weeks and check A1c at that time. - Would likely decrease the dose of NPH further and add metformin 500 mg twice daily during next visit. - The goal would be to take him off insulin if possible slowly. - Would not take off insulin abruptly as he has beautifully responded to insulin therapy.

## 2012-07-07 NOTE — Progress Notes (Signed)
  Subjective:    Patient ID: Austin Mcdaniel, male    DOB: 1970/11/19, 42 y.o.   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  His last A1c was 6.9 which was a significant improvement from 12. He is still taking 14 units of NPH twice daily and reports multiple hypoglycemic spells in 60s since last clinic visit.  His blood pressure today is 182/95. He denies any chest pain, headache, palpitations, shortness of breath, vision changes.    He has completed physical therapy and is still continuing to exercise regularly. He says he is following low salt and appropriately diabetic diet.  He denies any fever, chills, nausea, vomiting, abdominal pain, diarrhea.  Review of Systems     as per history of present illness.  Objective:   Physical Exam  General: NAD HEENT: PERRL, EOMI, no scleral icterus Cardiac: S1, S2, RRR, no rubs, murmurs or gallops Pulm: clear to auscultation bilaterally, moving normal volumes of air Abd: soft, nontender, nondistended, BS present Ext: warm and well perfused, no pedal edema. Left BKA prosthesis in place. Neuro: alert and oriented X3, cranial nerves II-XII grossly intact       Assessment & Plan:

## 2012-07-07 NOTE — Progress Notes (Signed)
Case discussed with Dr. Patel (at time of visit, soon after the resident saw the patient).  We reviewed the resident's history and exam and pertinent patient test results.  I agree with the assessment, diagnosis, and plan of care documented in the resident's note. 

## 2012-07-07 NOTE — Patient Instructions (Signed)
Please make followup appointment in 4-5 weeks.  Increase the dose of lisinopril to 20 mg daily. Will start hydrochlorothiazide 25 mg daily for high blood pressure.  Decrease the dose of insulin to 10units twice daily  Stop taking Pravachol. Start taking simvastatin 40 mg daily.

## 2012-07-28 ENCOUNTER — Encounter: Payer: PRIVATE HEALTH INSURANCE | Admitting: Internal Medicine

## 2012-08-17 DIAGNOSIS — R809 Proteinuria, unspecified: Secondary | ICD-10-CM | POA: Diagnosis present

## 2012-09-02 ENCOUNTER — Other Ambulatory Visit: Payer: Self-pay

## 2012-09-13 ENCOUNTER — Ambulatory Visit (INDEPENDENT_AMBULATORY_CARE_PROVIDER_SITE_OTHER): Payer: No Typology Code available for payment source | Admitting: Internal Medicine

## 2012-09-13 VITALS — BP 161/96 | HR 74 | Temp 98.2°F | Ht 73.0 in | Wt 254.5 lb

## 2012-09-13 DIAGNOSIS — R809 Proteinuria, unspecified: Secondary | ICD-10-CM

## 2012-09-13 DIAGNOSIS — L97922 Non-pressure chronic ulcer of unspecified part of left lower leg with fat layer exposed: Secondary | ICD-10-CM | POA: Insufficient documentation

## 2012-09-13 DIAGNOSIS — IMO0001 Reserved for inherently not codable concepts without codable children: Secondary | ICD-10-CM

## 2012-09-13 DIAGNOSIS — E1129 Type 2 diabetes mellitus with other diabetic kidney complication: Secondary | ICD-10-CM

## 2012-09-13 DIAGNOSIS — L97909 Non-pressure chronic ulcer of unspecified part of unspecified lower leg with unspecified severity: Secondary | ICD-10-CM

## 2012-09-13 LAB — GLUCOSE, CAPILLARY: Glucose-Capillary: 102 mg/dL — ABNORMAL HIGH (ref 70–99)

## 2012-09-13 NOTE — Assessment & Plan Note (Addendum)
Physical exam suggests a Wagner grade 2 ulcer. No signs or symptoms of infection or osteomyelitis. Likely caused by mechanical loading on his prosthetic leg in the setting of starting a new job where he is on his feet for 8-9 hours a day. - Referral to wound care given history of osteomyelitis - appointment August 7 at 1:30 PM - Continue local wound care with hydrogen peroxide and antibiotic ointment - Light duty at work. Note has been provided. - Followup after wound care appointment.

## 2012-09-13 NOTE — Progress Notes (Signed)
  Subjective:    Patient ID: Austin Mcdaniel, male    DOB: Jul 02, 1970, 42 y.o.   MRN: [id number]  HPI 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  Last Monday he noted an ulcer developing on the anterior side of his left leg stump. 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 He has noticed no signs of infection including obvious purulent material, redness, swelling or warmth around the ulcer. However he has noted some clear liquid discharge for the past day. He denies fever and chills. He has been using hydrogren peroxide, neosporin, and hebocleanse to manage the ulcer. No recent imaging of the stump. Ankle brachial index study was within normal limits in November of 2013. This is the first complication of his amputation.  Regarding his diabetes, the patient is compliant with his regimen of NPH insulin 10 units twice daily. CBGs have been running around 120 in the morning and night. He notes rare symptoms of lightheadedness, diaphoresis suggesting hypoglycemia, but he has been able to ward off symptoms with a small snack of peanut butter crackers. His hemoglobin A1c is 6.6 today.   Review of Systems  Constitutional: Negative for fever and chills.  Eyes: Negative for visual disturbance.  Respiratory: Negative for shortness of breath.   Cardiovascular: Negative for chest pain, palpitations and leg swelling.  Musculoskeletal: Negative for joint swelling.  Skin: Negative for pallor and rash.  Neurological: Negative for headaches.       Objective:   Physical Exam  Constitutional: He appears well-developed and well-nourished.  Cardiovascular: Normal rate, regular rhythm and normal heart sounds.   Pulmonary/Chest: Effort normal and breath sounds normal.   Musculoskeletal:  There is a 1 x 1 cm Wagner grade 2 ulcer on the anterior portion of his left stump. No apparent bony involvement or abscess formation. The area is not red, swollen, or warm. There is clear sero-sanguinous discharge. The stump is warm and well perfused.          Assessment & Plan:

## 2012-09-13 NOTE — Patient Instructions (Addendum)
Thank you for your visit. - Please follow up with wound care on August 7. - In the meantime continue cleaning the ulcer with hydrogen peroxide and antibiotic ointment. - Please try to stay off your feet to allow the ulcer to continue to heal. I have provided a work note suggesting lighter duty. - Please return to the clinic if the ulcer becomes pus-filled, red, or warm, or if you developed a fever. - We will see you again in 3 weeks after your appointment with wound care.

## 2012-09-13 NOTE — Assessment & Plan Note (Addendum)
Lab Results  Component Value Date   HGBA1C 6.6 09/13/2012   HGBA1C 6.9 05/12/2012   HGBA1C 14.5* 01/03/2012     Assessment: Diabetes control:  Improved Progress toward A1C goal:   At goal  Plan: Medications:  continue current medications Other plans: Diabetes control much improved on 10 units NPH insulin twice daily.

## 2012-09-13 NOTE — Progress Notes (Signed)
I saw and evaluated the patient.  I personally confirmed the key portions of the history and exam documented by Dr. Cater and I reviewed pertinent patient test results.  The assessment, diagnosis, and plan were formulated together and I agree with the documentation in the resident's note. 

## 2012-09-30 ENCOUNTER — Encounter (HOSPITAL_BASED_OUTPATIENT_CLINIC_OR_DEPARTMENT_OTHER): Payer: PRIVATE HEALTH INSURANCE | Attending: Internal Medicine

## 2012-10-05 ENCOUNTER — Ambulatory Visit (INDEPENDENT_AMBULATORY_CARE_PROVIDER_SITE_OTHER): Payer: No Typology Code available for payment source | Admitting: Internal Medicine

## 2012-10-05 VITALS — BP 167/97 | HR 79 | Temp 97.0°F | Wt 258.4 lb

## 2012-10-05 DIAGNOSIS — Z720 Tobacco use: Secondary | ICD-10-CM

## 2012-10-05 DIAGNOSIS — N289 Disorder of kidney and ureter, unspecified: Secondary | ICD-10-CM

## 2012-10-05 DIAGNOSIS — N059 Unspecified nephritic syndrome with unspecified morphologic changes: Secondary | ICD-10-CM

## 2012-10-05 DIAGNOSIS — E1159 Type 2 diabetes mellitus with other circulatory complications: Secondary | ICD-10-CM

## 2012-10-05 DIAGNOSIS — L97909 Non-pressure chronic ulcer of unspecified part of unspecified lower leg with unspecified severity: Secondary | ICD-10-CM

## 2012-10-05 DIAGNOSIS — E119 Type 2 diabetes mellitus without complications: Secondary | ICD-10-CM

## 2012-10-05 DIAGNOSIS — F17200 Nicotine dependence, unspecified, uncomplicated: Secondary | ICD-10-CM

## 2012-10-05 DIAGNOSIS — I10 Essential (primary) hypertension: Secondary | ICD-10-CM

## 2012-10-05 DIAGNOSIS — L97922 Non-pressure chronic ulcer of unspecified part of left lower leg with fat layer exposed: Secondary | ICD-10-CM

## 2012-10-05 DIAGNOSIS — I798 Other disorders of arteries, arterioles and capillaries in diseases classified elsewhere: Secondary | ICD-10-CM

## 2012-10-05 DIAGNOSIS — I129 Hypertensive chronic kidney disease with stage 1 through stage 4 chronic kidney disease, or unspecified chronic kidney disease: Secondary | ICD-10-CM

## 2012-10-05 MED ORDER — LISINOPRIL 20 MG PO TABS: 20.0000 mg | ORAL_TABLET | Freq: Two times a day (BID) | ORAL | Status: DC

## 2012-10-05 NOTE — Patient Instructions (Addendum)
General Instructions: -Start taking Lisinopril 20mg  twice per day.  -Follow up in two weeks for your blood pressure.  -Follow up with the wound care clinic.  -Continue cleaning the wound daily with daily dressing changes.    Treatment Goals:  Goals (1 Years of Data) as of 10/05/12         As of Today As of Today 09/13/12 07/07/12 05/12/12     Blood Pressure    . Blood Pressure < 140/90  167/97 153/93 161/96 182/95 159/106     Lifestyle    . Prevent Falls           Result Component    . HEMOGLOBIN A1C < 7.0    6.6  6.9    . LDL CALC < 100     175       Progress Toward Treatment Goals:  Treatment Goal 10/05/2012  Hemoglobin A1C improved  Blood pressure unchanged  Stop smoking smoking less    Self Care Goals & Plans:  Self Care Goal 10/05/2012  Manage my medications take my medicines as prescribed  Monitor my health -  Eat healthy foods -  Be physically active -  Meeting treatment goals maintain the current self-care plan    Home Blood Glucose Monitoring 10/05/2012  Check my blood sugar 2 times a day  When to check my blood sugar before breakfast; after dinner     Care Management & Community Referrals:  Referral 10/05/2012  Referrals made for care management support none needed  Referrals made to community resources none

## 2012-10-06 ENCOUNTER — Encounter: Payer: Self-pay | Admitting: Internal Medicine

## 2012-10-06 DIAGNOSIS — N289 Disorder of kidney and ureter, unspecified: Secondary | ICD-10-CM | POA: Insufficient documentation

## 2012-10-06 DIAGNOSIS — N179 Acute kidney failure, unspecified: Secondary | ICD-10-CM | POA: Insufficient documentation

## 2012-10-06 NOTE — Assessment & Plan Note (Signed)
  Assessment: Progress toward smoking cessation:  smoking less Barriers to progress toward smoking cessation:  none Comments: Pt is not an everyday smoker, smokes cigars occasionally  Plan: Instruction/counseling given:  I counseled patient on the dangers of tobacco use, advised patient to stop smoking, and reviewed strategies to maximize success. Educational resources provided:   None Self management tools provided:   None Medications to assist with smoking cessation:  None Patient agreed to the following self-care plans for smoking cessation:    Other plans: Pt wants to continue smoking cigars occasionally.

## 2012-10-06 NOTE — Assessment & Plan Note (Signed)
Wound is stable, with no signs of infection, some granulation tissue. Wound care appointment in two weeks. Provided Telfa nod-adhering gaze for moisture control. Pt advised to avoid dry gauze as this could lead to granulatio tissue/skin sticking and tearing. Pt to continue with daily cleaning with hydrogen peroxide/hebiclens.  He will call us or the ED if he develops discharge/odor/pain from the wound or if he has fever/chills.  Follow up in two weeks.

## 2012-10-06 NOTE — Assessment & Plan Note (Signed)
Lab Results  Component Value Date   HGBA1C 6.6 09/13/2012   HGBA1C 6.9 05/12/2012   HGBA1C 14.5* 01/03/2012     Assessment: Diabetes control: good control (HgbA1C at goal) Progress toward A1C goal:  improved Comments: He is on NPH insulin 10 units BID. Denies recent hypoglycemia. He checks his CBG daily before breakfast and after dinner.   Plan: Medications:  continue current medications Home glucose monitoring: Frequency: 2 times a day Timing: before breakfast;after dinner Instruction/counseling given: reminded to get eye exam, reminded to bring blood glucose meter & log to each visit, reminded to bring medications to each visit and discussed foot care Educational resources provided:  None Self management tools provided:  None Other plans: Pt just started a new job and will wait until his new insurance kicks in to schedule eye exam. Diabetic foot exam of right foot during this visit.

## 2012-10-06 NOTE — Assessment & Plan Note (Addendum)
Microalbumin/Cr >2000 on 3/13, Cr of 1.92, GFR of 42. Likely secondary to diabetes. BP and glycemic control will be key to preventing further progression. Will consider Nephrology referral during next visit if pt has not been seen by Nephrology already.

## 2012-10-06 NOTE — Progress Notes (Signed)
  Subjective:    Patient ID: Austin Mcdaniel, male    DOB: October 03, 1970, 42 y.o.   MRN: [id number]  HPI Austin Mcdaniel is a 42 year old man with PMH of DM2, s/p Left BKA secondary to osteomyelitis, and HTN who presents for follow up visit for wound check in his left BKA stump. He continues to clean the wound daily with hydrogen peroxide and Hibiclens, and applies antibiotic ointment daily, and leaves it uncovered as Band-aid comes off the wound easily due to perspiration moisture surrounding the wound. He denies bleeding, increased pain, or discharge from the wound.   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 Wound care did not have an appointment available for him until the end of this month.    Review of Systems  Constitutional: Negative for fever, chills, diaphoresis, activity change, appetite change and fatigue.  Respiratory: Negative for cough and shortness of breath.   Cardiovascular: Negative for chest pain, palpitations and leg swelling.  Gastrointestinal: Negative for abdominal pain.  Musculoskeletal: Negative for arthralgias.  Skin: Positive for wound. Negative for color change, pallor and rash.  Neurological: Negative for dizziness and light-headedness.  Psychiatric/Behavioral: Negative for behavioral problems and agitation.       Objective:   Physical Exam  Nursing note and vitals reviewed. Constitutional: He is oriented to person, place, and time. He appears well-developed and well-nourished. No distress.  HENT:  Head: Normocephalic and atraumatic.  Eyes: Conjunctivae are normal. No scleral icterus.  Cardiovascular: Normal rate and regular rhythm.   Pulmonary/Chest: Effort normal and breath sounds normal. No respiratory distress. He has no wheezes. He has no rales.  Abdominal: Soft.  Musculoskeletal: He exhibits no edema and no tenderness.  Left BKA   Neurological: He is alert and oriented to person, place, and time.  Skin: Skin is warm and dry. No rash noted. He is not diaphoretic. No erythema. No pallor.  1x1cm ulcer on anterior tip of BKA stump with pink granulation tissue, with no odor, discharge, bleeding, increased warmth, tenderness to palpation, or surrounding erythema, no exposed bone. Perspiration moisture surrounding wound.  Psychiatric: He has a normal mood and affect. His behavior is normal.          Assessment & Plan:

## 2012-10-06 NOTE — Progress Notes (Signed)
Case discussed with Dr. Kennerly soon after the resident saw the patient.  We reviewed the resident's history and exam and pertinent patient test results.  I agree with the assessment, diagnosis, and plan of care documented in the resident's note. 

## 2012-10-06 NOTE — Assessment & Plan Note (Signed)
Pt needs referral to Nephrology if not seen yet.

## 2012-10-06 NOTE — Assessment & Plan Note (Signed)
BP Readings from Last 3 Encounters:  10/05/12 167/97  09/13/12 161/96  07/07/12 182/95    Lab Results  Component Value Date   NA 135 07/07/2012   K 5.0 07/07/2012   CREATININE 1.92* 07/07/2012    Assessment: Blood pressure control: mildly elevated Progress toward BP goal:  unchanged Comments: He is on Lisinopril 20mg  daily and HCTZ 25mg  daily. BP persistently elevated with context of Nephropathy.   Plan: Medications:  Increased Lisinopril to 40mg  daily, maintain HCTZ at 25 mg. Given his Nephropathy, he may respond better to Lasix, will consider Lasix v HCTZ during next visit if BP still elevated. He may need a third medication, Norvasc, if BP remains elevated after Lasix tx.  Educational resources provided:  None  Self management tools provided:  None Other plans: Follow up in two weeks.

## 2012-10-14 ENCOUNTER — Encounter (HOSPITAL_COMMUNITY): Payer: Self-pay | Admitting: *Deleted

## 2012-10-14 ENCOUNTER — Emergency Department (HOSPITAL_COMMUNITY): Payer: PRIVATE HEALTH INSURANCE

## 2012-10-14 ENCOUNTER — Emergency Department (HOSPITAL_COMMUNITY)
Admission: EM | Admit: 2012-10-14 | Discharge: 2012-10-14 | Disposition: A | Discharge status: Home / Self Care | Payer: PRIVATE HEALTH INSURANCE | Attending: Emergency Medicine | Admitting: Emergency Medicine

## 2012-10-14 DIAGNOSIS — L97922 Non-pressure chronic ulcer of unspecified part of left lower leg with fat layer exposed: Secondary | ICD-10-CM

## 2012-10-14 DIAGNOSIS — Z79899 Other long term (current) drug therapy: Secondary | ICD-10-CM | POA: Insufficient documentation

## 2012-10-14 DIAGNOSIS — F17200 Nicotine dependence, unspecified, uncomplicated: Secondary | ICD-10-CM | POA: Insufficient documentation

## 2012-10-14 DIAGNOSIS — E119 Type 2 diabetes mellitus without complications: Secondary | ICD-10-CM | POA: Insufficient documentation

## 2012-10-14 DIAGNOSIS — Z8739 Personal history of other diseases of the musculoskeletal system and connective tissue: Secondary | ICD-10-CM | POA: Insufficient documentation

## 2012-10-14 DIAGNOSIS — I10 Essential (primary) hypertension: Secondary | ICD-10-CM | POA: Insufficient documentation

## 2012-10-14 DIAGNOSIS — Y939 Activity, unspecified: Secondary | ICD-10-CM | POA: Insufficient documentation

## 2012-10-14 DIAGNOSIS — L97509 Non-pressure chronic ulcer of other part of unspecified foot with unspecified severity: Secondary | ICD-10-CM | POA: Insufficient documentation

## 2012-10-14 DIAGNOSIS — E1169 Type 2 diabetes mellitus with other specified complication: Secondary | ICD-10-CM | POA: Insufficient documentation

## 2012-10-14 DIAGNOSIS — R296 Repeated falls: Secondary | ICD-10-CM | POA: Insufficient documentation

## 2012-10-14 DIAGNOSIS — Z8719 Personal history of other diseases of the digestive system: Secondary | ICD-10-CM | POA: Insufficient documentation

## 2012-10-14 DIAGNOSIS — Y929 Unspecified place or not applicable: Secondary | ICD-10-CM | POA: Insufficient documentation

## 2012-10-14 DIAGNOSIS — R0789 Other chest pain: Secondary | ICD-10-CM

## 2012-10-14 DIAGNOSIS — Z794 Long term (current) use of insulin: Secondary | ICD-10-CM | POA: Insufficient documentation

## 2012-10-14 DIAGNOSIS — S298XXA Other specified injuries of thorax, initial encounter: Secondary | ICD-10-CM | POA: Insufficient documentation

## 2012-10-14 DIAGNOSIS — W19XXXA Unspecified fall, initial encounter: Secondary | ICD-10-CM

## 2012-10-14 HISTORY — DX: Essential (primary) hypertension: I10

## 2012-10-14 NOTE — ED Notes (Signed)
Pt with L BKA to ED c/o leg pain and R rib/hand pain.  States being tx for L leg infection.  L leg buckled and pt fell on floor with R hand taking impact of chest.  C/o L hand pain and L chest pain.  Denies sob, pain on inspiration or bruising.  L leg with open wound, though no redness noted at this time.

## 2012-10-14 NOTE — ED Provider Notes (Signed)
CSN: [id number]     Arrival date & time 10/14/12  1227 History     First MD Initiated Contact with Patient 10/14/12 1236     Chief Complaint  Patient presents with  . Leg Pain  . Chest Pain   (Consider location/radiation/quality/duration/timing/severity/associated sxs/prior Treatment) HPI  42 year old male with history of insulin-dependent diabetes, left BKA secondary to diabetic foot ulcer presents for evaluations of recent fall. Patient states he had a left foot prosthesis for his BKA, that was revised 5 days ago. Initially, he was adjusting to the new prosthesis adjustment well however for the past several days he has had increasing pain to his stub.  Yesterday while walking out of the bathroom, his left leg buckled, causing him to fall forward.  He report jamming his R hand into his chest wall. Report having acute onset of sharp achy non radiating pain to his right lower chest after the fall.  Right chest wall pain is worsening with laughing, cough, or sneeze and improves with ibuprofen. No associated increased shortness of breath, productive cough, or hemoptysis.Denies any significant pain to R hand or wrist. He also reported pain at his left stub from the fall.  He noticed increased redness at the stub near an ulceration that he has had on his L stub for the past several weeks .He was able to ambulate afterward. He denies hitting his head or loss of consciousness. No precipitating symptoms prior to the fall.  Pt currently cared for stub ulceration with hebeclins/hydrogen peroxide and triple antibiotic ointment as recommended by his doctor.  Pt denies fever, pustular drainage or other signs of infection at this time.    Past Medical History  Diagnosis Date  . Acid reflux   . Diabetic foot ulcer 06/2011; 10/2011-12/2011    left dorsal foot  (01/03/2012)  . Osteomyelitis of left foot   . Type II diabetes mellitus   . Hypertension    Past Surgical History  Procedure Laterality Date  . Toe  amputation  2009; 2012?    left great; left 2nd toe  . Amputation  01/06/2012    Procedure: AMPUTATION BELOW KNEE;  Surgeon: Toni Arthurs, MD;  Location[facility] OR;  Service: Orthopedics;  Laterality: Left;  DR.HEWITT WOULD LIKE TO FOLLOW HIS OTHER AFTERNOON CASE STARTING AROUND 1700-1715   No family history on file. History  Substance Use Topics  . Smoking status: Current Some Day Smoker -- 0.10 packs/day for 7 years    Types: Cigars  . Smokeless tobacco: Current User    Types: Snuff     Comment: 01/03/2012 "quit cigarettes 15-20 yr ago; occasionally smoke a pipe; dip rarely - last time ~ 3 months ago"  . Alcohol Use: 1.2 oz/week    2 Cans of beer per week     Comment: 01/03/2012 "couple beers once/wk"    Review of Systems  All other systems reviewed and are negative.    Allergies  Review of patient's allergies indicates no known allergies.  Home Medications   Current Outpatient Rx  Name  Route  Sig  Dispense  Refill  . hydrochlorothiazide (HYDRODIURIL) 25 MG tablet   Oral   Take 1 tablet (25 mg total) by mouth daily.   30 tablet   11   . insulin NPH-regular (NOVOLIN 70/30) (70-30) 100 UNIT/ML injection   Subcutaneous   Inject 10 Units into the skin 2 (two) times daily with a meal. Inject 10 units twice daily - breakfast and dinner.         Marland Kitchen  lisinopril (PRINIVIL,ZESTRIL) 20 MG tablet   Oral   Take 1 tablet (20 mg total) by mouth 2 (two) times daily.   60 tablet   11   . simvastatin (ZOCOR) 40 MG tablet   Oral   Take 1 tablet (40 mg total) by mouth every evening.   30 tablet   11   . EXPIRED: zolpidem (AMBIEN) 10 MG tablet   Oral   Take 1 tablet (10 mg total) by mouth at bedtime as needed for sleep.   30 tablet   2    There were no vitals taken for this visit. Physical Exam  Nursing note and vitals reviewed. Constitutional: He is oriented to person, place, and time. He appears well-developed and well-nourished. No distress.  HENT:  Head: Normocephalic and  atraumatic.  Eyes: Conjunctivae are normal.  Neck: Neck supple.  Cardiovascular: Normal rate and regular rhythm.  Exam reveals no gallop and no friction rub.   No murmur heard. Pulmonary/Chest: Effort normal and breath sounds normal. No respiratory distress. He has no wheezes. He has no rales. He exhibits tenderness (tenderness to R inferior ribs pain on palpation without flail chest, crepitus, emphysema or overlying skin changes. ).  Lungs CTAB  Abdominal: Soft. There is no tenderness.  No tenderness overlying the spleen, or liver.  Musculoskeletal: He exhibits tenderness (L BKA stub: a 1cm ulceration noted to inferior surface without evidence of infection.  mild tenderness to palpation, no obvious feformity).  Neurological: He is alert and oriented to person, place, and time.  Skin: Skin is warm. No rash noted.  Psychiatric: He has a normal mood and affect.    ED Course   Procedures (including critical care time)\  Pt with mechanical fall presents with R lower ribs pain and L stub pain.  Xray obtain.  Pain medication offered, pt declined.    2:15 PM xrays shows no evidence of ribs fractures or stub fracture.  No evidence of osteomyelitis to L stub.  Plan is to apply bacitracin and dressing over wound. Due to patient's pain and the risk of recurrent fall, will give patient work note for the next 5 days off. He will call and follow up with his Dr. for further management of his stub. Patient will take over-the-counter ibuprofen or Tylenol at home. Pain medication offered, patient declined. Return precautions discussed including worsening pain, signs of infection, or if he has any other concern. Otherwise patient is stable for discharge.  Labs Reviewed - No data to display Dg Ribs Unilateral W/chest Right  10/14/2012   CLINICAL DATA:  Fall, right chest pain  EXAM: RIGHT RIBS AND CHEST - 3+ VIEW  COMPARISON:  01/02/2012  FINDINGS: Heart and mediastinal contours are within normal limits. No  focal opacities or effusions. No acute bony abnormality. No visible rib fracture. No pneumothorax.  IMPRESSION: Negative.   Electronically Signed   By: Charlett Nose   On: 10/14/2012 14:06   Dg Tibia/fibula Left  10/14/2012   CLINICAL DATA:  Fall, pain.  EXAM: LEFT TIBIA AND FIBULA - 2 VIEW  COMPARISON:  None.  FINDINGS: Patient is status post below-the-knee amputation. No acute bony abnormality. Specifically, no fracture, subluxation, or dislocation. Soft tissues are intact. Right knee joint spaces are maintained. No joint effusion.  IMPRESSION: No acute bony abnormality.   Electronically Signed   By: Charlett Nose   On: 10/14/2012 14:07   1. Fall at home, initial encounter   2. Ulcer of left lower leg with fat layer  exposed   3. Anterior chest wall pain     MDM  BP 154/93  Pulse 76  Temp(Src) 97.4 F (36.3 C) (Oral)  Resp 14  SpO2 100%  I have reviewed nursing notes and vital signs. I personally reviewed the imaging tests through PACS system  I reviewed available ER/hospitalization records thought the EMR    Fayrene Helper, PA-C 10/14/12 1440

## 2012-10-15 NOTE — ED Provider Notes (Signed)
Medical screening examination/treatment/procedure(s) were performed by non-physician practitioner and as supervising physician I was immediately available for consultation/collaboration.   Audree Camel, MD 10/15/12 [phone]

## 2012-10-22 ENCOUNTER — Encounter: Payer: Self-pay | Admitting: Internal Medicine

## 2012-10-22 ENCOUNTER — Ambulatory Visit: Payer: PRIVATE HEALTH INSURANCE | Admitting: Internal Medicine

## 2012-11-08 ENCOUNTER — Encounter (HOSPITAL_BASED_OUTPATIENT_CLINIC_OR_DEPARTMENT_OTHER): Payer: No Typology Code available for payment source | Attending: General Surgery

## 2012-11-08 DIAGNOSIS — Z794 Long term (current) use of insulin: Secondary | ICD-10-CM | POA: Insufficient documentation

## 2012-11-08 DIAGNOSIS — E1169 Type 2 diabetes mellitus with other specified complication: Secondary | ICD-10-CM | POA: Insufficient documentation

## 2012-11-08 DIAGNOSIS — Y835 Amputation of limb(s) as the cause of abnormal reaction of the patient, or of later complication, without mention of misadventure at the time of the procedure: Secondary | ICD-10-CM | POA: Insufficient documentation

## 2012-11-08 DIAGNOSIS — Z79899 Other long term (current) drug therapy: Secondary | ICD-10-CM | POA: Insufficient documentation

## 2012-11-08 DIAGNOSIS — T8789 Other complications of amputation stump: Secondary | ICD-10-CM | POA: Insufficient documentation

## 2012-11-08 DIAGNOSIS — L97809 Non-pressure chronic ulcer of other part of unspecified lower leg with unspecified severity: Secondary | ICD-10-CM | POA: Insufficient documentation

## 2012-11-08 DIAGNOSIS — I10 Essential (primary) hypertension: Secondary | ICD-10-CM | POA: Insufficient documentation

## 2012-11-09 NOTE — Progress Notes (Signed)
[facility]  NAME:  Austin Mcdaniel, Austin Mcdaniel NO.:  [id number]  MEDICAL RECORD NO.:  [id number]      DATE OF BIRTH:  [birth year]  PHYSICIAN:  Ardath Sax, M.D.      VISIT DATE:  11/08/2012                                  OFFICE VISIT   HISTORY:  Mr. Spradley is a 42 year old diabetic man who had great problems with his right leg.  We treated him here for diabetic foot ulcers and eventually, he developed osteomyelitis and ended up having a below-knee amputation on the right.  He has an ulcer about a cm in diameter in the wound area, presumably due to pressure from his prosthesis.  He is being refitted for his prosthesis at the present time.  This lesion is not very deep and I feel that if we just keep pressure off it, we could get it to heal and that will be handled by the prosthetic maker who will make this more of a side weightbearing prosthesis.  The patient's vital signs were relatively normal.  He was hypertensive with a blood pressure of 187/110, respirations 16, pulse 77, temperature 99.  He weighs 245 pounds.  His blood sugar today was 109.  He tells [place] that his last A1c was 5.  His medications are NPH insulin 10 units 2 times a day, lisinopril 10 mg a day, simvastatin 20 a day, and hydrochlorothiazide 25 a day.  We will treat him today with silver alginate and have him come back in a week.  I am calling as a Wagner 2 diabetic ulcer on the stump of his left leg.  He will change the dressing himself and put on a new silver alginate daily.  He will return in a week.  DIAGNOSES: 1. Diabetes. 2. Post below-knee amputation on the left with a pressure ulcer on the     stump. 3. Hypertension.     Ardath Sax, M.D.     PP/MEDQ  D:  11/08/2012  T:  11/09/2012  Job:  [id number]

## 2012-11-10 ENCOUNTER — Telehealth: Payer: Self-pay | Admitting: *Deleted

## 2012-11-10 NOTE — Telephone Encounter (Signed)
A user error has taken place: encounter opened in error, closed for administrative reasons, orders placed in error, not carried out on this patient.

## 2012-11-26 ENCOUNTER — Encounter (HOSPITAL_BASED_OUTPATIENT_CLINIC_OR_DEPARTMENT_OTHER): Payer: No Typology Code available for payment source | Attending: General Surgery

## 2012-11-26 DIAGNOSIS — E1169 Type 2 diabetes mellitus with other specified complication: Secondary | ICD-10-CM | POA: Insufficient documentation

## 2012-11-26 DIAGNOSIS — L97809 Non-pressure chronic ulcer of other part of unspecified lower leg with unspecified severity: Secondary | ICD-10-CM | POA: Insufficient documentation

## 2012-11-26 DIAGNOSIS — T8789 Other complications of amputation stump: Secondary | ICD-10-CM | POA: Insufficient documentation

## 2012-11-26 DIAGNOSIS — Y835 Amputation of limb(s) as the cause of abnormal reaction of the patient, or of later complication, without mention of misadventure at the time of the procedure: Secondary | ICD-10-CM | POA: Insufficient documentation

## 2012-12-30 ENCOUNTER — Other Ambulatory Visit: Payer: Self-pay

## 2013-05-04 ENCOUNTER — Other Ambulatory Visit: Payer: Self-pay | Admitting: *Deleted

## 2013-05-05 MED ORDER — INSULIN NPH ISOPHANE & REGULAR (70-30) 100 UNIT/ML ~~LOC~~ SUSP: 10.0000 [IU] | Freq: Two times a day (BID) | SUBCUTANEOUS | Status: DC

## 2013-05-05 NOTE — Telephone Encounter (Signed)
[name] is way overdue for a blood pressure check. Please schedule him to be seen next week. Any provider is ok.

## 2013-05-24 ENCOUNTER — Encounter: Payer: Self-pay | Admitting: Dietician

## 2013-05-26 ENCOUNTER — Encounter: Payer: No Typology Code available for payment source | Admitting: Internal Medicine

## 2013-06-02 ENCOUNTER — Other Ambulatory Visit: Payer: Self-pay

## 2013-08-15 ENCOUNTER — Other Ambulatory Visit: Payer: Self-pay | Admitting: *Deleted

## 2013-08-15 NOTE — Telephone Encounter (Signed)
Flag sent to front desk pool for appt to be made per Dr Sherrine MaplesGlenn.

## 2013-08-15 NOTE — Telephone Encounter (Signed)
Pt needs a f/u appointment. He has not been seen in our clinic since 09/2012. Please ask him if he still wants us to be his PCP and if so, schedule a f/u appointment.

## 2013-08-18 ENCOUNTER — Other Ambulatory Visit: Payer: Self-pay | Admitting: Internal Medicine

## 2013-08-18 ENCOUNTER — Ambulatory Visit (INDEPENDENT_AMBULATORY_CARE_PROVIDER_SITE_OTHER): Payer: 59 | Admitting: Internal Medicine

## 2013-08-18 ENCOUNTER — Encounter: Payer: Self-pay | Admitting: Internal Medicine

## 2013-08-18 VITALS — BP 189/102 | HR 71 | Temp 98.6°F | Wt 282.0 lb

## 2013-08-18 DIAGNOSIS — N1832 Chronic kidney disease, stage 3b: Secondary | ICD-10-CM

## 2013-08-18 DIAGNOSIS — R51 Headache: Secondary | ICD-10-CM

## 2013-08-18 DIAGNOSIS — E1159 Type 2 diabetes mellitus with other circulatory complications: Secondary | ICD-10-CM

## 2013-08-18 DIAGNOSIS — I129 Hypertensive chronic kidney disease with stage 1 through stage 4 chronic kidney disease, or unspecified chronic kidney disease: Secondary | ICD-10-CM

## 2013-08-18 DIAGNOSIS — I10 Essential (primary) hypertension: Secondary | ICD-10-CM

## 2013-08-18 DIAGNOSIS — J3489 Other specified disorders of nose and nasal sinuses: Secondary | ICD-10-CM | POA: Insufficient documentation

## 2013-08-18 DIAGNOSIS — R519 Headache, unspecified: Secondary | ICD-10-CM

## 2013-08-18 DIAGNOSIS — N183 Chronic kidney disease, stage 3 unspecified: Secondary | ICD-10-CM

## 2013-08-18 DIAGNOSIS — E785 Hyperlipidemia, unspecified: Secondary | ICD-10-CM

## 2013-08-18 DIAGNOSIS — I798 Other disorders of arteries, arterioles and capillaries in diseases classified elsewhere: Secondary | ICD-10-CM

## 2013-08-18 LAB — GLUCOSE, CAPILLARY: GLUCOSE-CAPILLARY: 131 mg/dL — AB (ref 70–99)

## 2013-08-18 LAB — POCT GLYCOSYLATED HEMOGLOBIN (HGB A1C): Hemoglobin A1C: 5.9

## 2013-08-18 MED ORDER — AMLODIPINE BESYLATE 5 MG PO TABS: 5.0000 mg | ORAL_TABLET | Freq: Every day | ORAL | Status: DC

## 2013-08-18 MED ORDER — HYDROCHLOROTHIAZIDE 25 MG PO TABS
25.0000 mg | ORAL_TABLET | Freq: Every day | ORAL | Status: DC
Start: 2013-08-18 — End: 2013-12-29

## 2013-08-18 MED ORDER — INSULIN NPH ISOPHANE & REGULAR (70-30) 100 UNIT/ML ~~LOC~~ SUSP: 10.0000 [IU] | Freq: Two times a day (BID) | SUBCUTANEOUS | Status: DC

## 2013-08-18 MED ORDER — LISINOPRIL 20 MG PO TABS: 20.0000 mg | ORAL_TABLET | Freq: Two times a day (BID) | ORAL | Status: DC

## 2013-08-18 MED ORDER — GUAIFENESIN ER 600 MG PO TB12: 600.0000 mg | ORAL_TABLET | Freq: Two times a day (BID) | ORAL | Status: DC

## 2013-08-18 NOTE — Assessment & Plan Note (Addendum)
Pt did see a nephrologist at [facility] 07/2012 but did not return. Note from Nephrology at [facility]: Subacute kidney injury on CKD (stage iii):  - unknown etiology but chronic component presumed secondary to diabetic nephropathy and/or hypertensive nephrosclerosis  - acute component differential includes IgA nephropathy vs interstitial nephritis in setting of peripheral eosinophilia vs lupus nephritis in setting of peripheral eosinophilia vs secondary FSGS vs MPGN  - less likely reasons include ANCA/anti-GBM (lack of systemic symptoms) vs PIGN (lack of preceding history of illness/infection) vs paraproteinemia (young age)  - baseline creatinine measured at 1.52-1.92 mg/dL from 12/2011 to 06/2012  Cr was 2.09, urine protein was 300 on UA, urine protein/Cr ratio was 2758 mg/g. SPEP and UPEP were normal. ANA and RPR negative. Hepatitis B&C were negative. A renal sonogram, renal artery duplex and a renal biopsy were discussed but not performed. His Lisinopril was increased to 10mg  daily due to the proteinuria.  - Checking UA, urine microalbumin/Cr, and BMP - Referring to [facility] as the pt would like to see someone local for further evaluation  Addendum: 08/19/13  Cr 3.37 and K 5.8. Pt was notified and brought back to the clinic today for repeat BMP, which shows K 5.8 and Cr 3.63. EKG is normal sinus rhythm and is unchanged from previous EKGs. He did not have an anion gap yesterday, but his AG is 15 today with decreased bicarb from 21 to 19. He has not had labs in a year, so unsure how acute these changes are, likely that his lisinopril is contributing, but he will need to be treated for the hyperkalemia and have his potassium monitored.  - Will admit for observation for hyperkalemia and worsening renal function with anion gap. - His lisinopril will need to be discontinued - He will likely need a Renal consult while in the hospital  The patient was discussed with both Dr. Marinda Elk and Dr.  Beryle Beams

## 2013-08-18 NOTE — Assessment & Plan Note (Signed)
Lab Results  Component Value Date   HGBA1C 5.9 08/18/2013   HGBA1C 6.6 09/13/2012   HGBA1C 6.9 05/12/2012     Assessment: Diabetes control: good control (HgbA1C at goal) Progress toward A1C goal:  at goal Comments: Foot exam normal with good pulses. No calluses or ulcerations. Sensation intact.   Plan: Medications:  continue current medications, but may consider trying to get the patient off insulin and control with po meds. He states that he did not tolerate Metformin in the past but has not been on a Sulfonylurea.  Home glucose monitoring: Frequency: once a day Timing: before breakfast Instruction/counseling given: reminded to bring blood glucose meter & log to each visit, reminded to bring medications to each visit and discussed the need for weight loss Educational resources provided: brochure Self management tools provided:   Other plans: A1c in 3 mo

## 2013-08-18 NOTE — Progress Notes (Signed)
Case discussed with Dr. [name] at the time of the visit.  We reviewed the resident's history and exam and pertinent patient test results.  I agree with the assessment, diagnosis, and plan of care documented in the resident's note.   

## 2013-08-18 NOTE — Assessment & Plan Note (Addendum)
Pt w/ pressure behind the eyes and over the bridge of his nose. He has been taking Sudafed, which is likely affecting his blood pressure. Recommended stopping the Sudafed and starting Mucinex 600mg  po BID.

## 2013-08-18 NOTE — Assessment & Plan Note (Signed)
He is currently on simvastatin 40mg  daily. He is due for a lipid panel; will check today.

## 2013-08-18 NOTE — Assessment & Plan Note (Addendum)
BP Readings from Last 3 Encounters:  08/18/13 189/102  10/14/12 154/93  10/05/12 167/97    Lab Results  Component Value Date   NA 135 07/07/2012   K 5.0 07/07/2012   CREATININE 1.92* 07/07/2012    Assessment: Blood pressure control: severely elevated Progress toward BP goal:  deteriorated Comments: Pt endorses compliance with his lisinopril 20mg  and HCTZ 25mg , despite this, his BP is significantly elevated today. He is taking Sudafed, which is likely contributing to his hypertension.   Plan: Medications:  continue current medications and adding Amlodipine 5mg  daily Educational resources provided: brochure Self management tools provided: home blood pressure logbook Other plans: Pt is to quit taking the Sudafed. - F/u in the next 1-2 weeks for BP check

## 2013-08-18 NOTE — Patient Instructions (Addendum)
**  Start taking the amlodipine 5mg  daily for your blood pressure.   **STOP taking the sudafed. This will act to increase your blood pressure. You can try Mucinex 600mg  every 12 hours.   **Come back to the clinic in about 2 weeks   General Instructions:   Please bring your medicines with you each time you come to clinic.  Medicines may include prescription medications, over-the-counter medications, herbal remedies, eye drops, vitamins, or other pills.   Progress Toward Treatment Goals:  Treatment Goal 10/05/2012  Hemoglobin A1C improved  Blood pressure unchanged  Stop smoking smoking less    Self Care Goals & Plans:  Self Care Goal 10/05/2012  Manage my medications take my medicines as prescribed  Monitor my health -  Eat healthy foods -  Be physically active -  Meeting treatment goals maintain the current self-care plan    Home Blood Glucose Monitoring 10/05/2012  Check my blood sugar 2 times a day  When to check my blood sugar before breakfast; after dinner     Care Management & Community Referrals:  Referral 10/05/2012  Referrals made for care management support none needed  Referrals made to community resources none

## 2013-08-18 NOTE — Progress Notes (Signed)
Patient ID: Austin Mcdaniel, male   DOB: 08/31/1970, 43 y.o.   MRN: [id number]  Subjective:   Patient ID: Austin Mcdaniel male   DOB: 08/31/1970 43 y.o.   MRN: [id number]  HPI: [name] is a 43 y.o. M w/ PMH DM2, HTN, CKD3 stage 3, and tobacco abuse presents for a routine follow up.   Pt states that he needs refills of his blood pressure medications and took his last pill this morning. He states that despite compliance with his medications, his blood pressure remains elevated. He denies any headaches or vision changes.   He states that he is concerned over gaining weight. His weight is up 24lbs since his last visit in August '14. He says that he has a new job where he sits all day and constantly snacks. He denies using salt and avoids fried foods. He is not exercising regularly.   He states that his blood sugar is generally well controlled 90-120s. He states that he is checking his CBG qam. He states that he is not having any lows below 70. He is taking 70/30 insulin 10 units BID. He did not bring his meter in today.   He is c/o sinus pressure behind his eyes and post nasal drainage. He denies any runny nose or mucus. He is taking Sudafed for this and took a dose this morning.   He has a h/o CKD, stage 3. He has [facility] by [facility].    Past Medical History  Diagnosis Date  . Acid reflux   . Diabetic foot ulcer 06/2011; 10/2011-12/2011    left dorsal foot  (01/03/2012)  . Osteomyelitis of left foot   . Type II diabetes mellitus   . Hypertension    Current Outpatient Prescriptions  Medication Sig Dispense Refill  . hydrochlorothiazide (HYDRODIURIL) 25 MG tablet Take 1 tablet (25 mg total) by mouth daily.  30 tablet  1  . insulin NPH-regular Human (NOVOLIN 70/30) (70-30) 100 UNIT/ML injection Inject 10 Units into the skin 2 (two) times daily with a meal. Inject 10 units twice daily - breakfast and dinner.  10 mL  11  . lisinopril (PRINIVIL,ZESTRIL) 20 MG tablet Take  1 tablet (20 mg total) by mouth 2 (two) times daily. Take with breakfast and dinner  30 tablet  1  . simvastatin (ZOCOR) 40 MG tablet Take 40 mg by mouth daily with supper.      Marland Kitchen. amLODipine (NORVASC) 5 MG tablet Take 1 tablet (5 mg total) by mouth daily.  30 tablet  3  . ibuprofen (ADVIL,MOTRIN) 200 MG tablet Take 400 mg by mouth 4 (four) times daily as needed for pain.      Marland Kitchen. Neomycin-Bacitracin-Polymyxin (TRIPLE ANTIBIOTIC EX) Apply 1 application topically 2 (two) times daily.       No current facility-administered medications for this visit.   No family history on file. History   Social History  . Marital Status: Legally Separated    Spouse Name: N/A    Number of Children: N/A  . Years of Education: N/A   Social History Main Topics  . Smoking status: Current Some Day Smoker -- 0.10 packs/day for 7 years    Types: Cigars  . Smokeless tobacco: Current User    Types: Snuff     Comment: 01/03/2012 "quit cigarettes 15-20 yr ago; occasionally smoke a pipe; dip rarely - last time ~ 3 months ago"  . Alcohol Use: 1.2 oz/week    2 Cans of beer per  week     Comment: 01/03/2012 "couple beers once/wk"  . Drug Use: No  . Sexual Activity: Yes   Other Topics Concern  . None   Social History Narrative  . None   Review of Systems: Constitutional: Denies fever, chills, appetite change or fatigue.  HEENT: Denies vision changes. + sinus congestion and post nasal drainage Respiratory: Denies SOB, DOE.   Cardiovascular: Denies chest pain   Gastrointestinal: Denies nausea, vomiting, abdominal pain, diarrhea, constipation Genitourinary: Denies dysuria, urgency, frequency Endocrine: Denies changes in hair or nails, polyuria, polydipsia. Musculoskeletal: Denies myalgias, back pain, joint swelling Skin: Denies rash or wound.  Neurological: Denies dizziness, syncope, weakness, or headaches.  Psychiatric/Behavioral: Denies suicidal ideation, mood changes  Objective:  Physical Exam: Filed  Vitals:   08/18/13 1058 08/18/13 1158  BP: 204/97 189/102  Pulse: 78 71  Temp: 98.6 F (37 C)   TempSrc: Oral   Weight: 282 lb (127.914 kg)   SpO2: 99%    Constitutional: Vital signs reviewed.  Patient is a well-developed and well-nourished male in no acute distress and cooperative with exam.   Head: Normocephalic and atraumatic Eyes: PERRL, EOMI  Cardiovascular: RRR, no MRG Pulmonary/Chest: Normal respiratory effort, CTAB, no wheezes, rales, or rhonchi Abdominal: Soft. Non-tender, non-distended, bowel sounds are normal Musculoskeletal: L BKA, right LE edema present Neurological: A&O x3, cranial nerve II-XII are grossly intact, no focal deficits. Sensation intact in right foot. Skin: Warm, dry and intact. No wounds.  Psychiatric: Normal mood and affect. speech and behavior is normal.  Assessment & Plan:   Please refer to Problem List based Assessment and Plan

## 2013-08-19 ENCOUNTER — Observation Stay (HOSPITAL_COMMUNITY): Payer: 59

## 2013-08-19 ENCOUNTER — Other Ambulatory Visit (INDEPENDENT_AMBULATORY_CARE_PROVIDER_SITE_OTHER): Payer: 59

## 2013-08-19 ENCOUNTER — Observation Stay (HOSPITAL_COMMUNITY)
Admission: RE | Admit: 2013-08-19 | Discharge: 2013-08-20 | Disposition: A | Discharge status: Home / Self Care | Payer: 59 | Source: Ambulatory Visit | Attending: Internal Medicine | Admitting: Internal Medicine

## 2013-08-19 ENCOUNTER — Encounter: Payer: Self-pay | Admitting: *Deleted

## 2013-08-19 ENCOUNTER — Ambulatory Visit: Payer: 59 | Admitting: Internal Medicine

## 2013-08-19 ENCOUNTER — Encounter (HOSPITAL_COMMUNITY): Payer: Self-pay

## 2013-08-19 VITALS — BP 115/74 | HR 73 | Temp 97.7°F | Resp 18 | Ht 74.0 in | Wt 267.4 lb

## 2013-08-19 DIAGNOSIS — N184 Chronic kidney disease, stage 4 (severe): Secondary | ICD-10-CM | POA: Insufficient documentation

## 2013-08-19 DIAGNOSIS — S88119A Complete traumatic amputation at level between knee and ankle, unspecified lower leg, initial encounter: Secondary | ICD-10-CM | POA: Insufficient documentation

## 2013-08-19 DIAGNOSIS — E875 Hyperkalemia: Secondary | ICD-10-CM | POA: Insufficient documentation

## 2013-08-19 DIAGNOSIS — N1832 Chronic kidney disease, stage 3b: Secondary | ICD-10-CM

## 2013-08-19 DIAGNOSIS — I798 Other disorders of arteries, arterioles and capillaries in diseases classified elsewhere: Secondary | ICD-10-CM | POA: Insufficient documentation

## 2013-08-19 DIAGNOSIS — E785 Hyperlipidemia, unspecified: Secondary | ICD-10-CM | POA: Insufficient documentation

## 2013-08-19 DIAGNOSIS — Z9119 Patient's noncompliance with other medical treatment and regimen: Secondary | ICD-10-CM | POA: Insufficient documentation

## 2013-08-19 DIAGNOSIS — L97529 Non-pressure chronic ulcer of other part of left foot with unspecified severity: Secondary | ICD-10-CM

## 2013-08-19 DIAGNOSIS — N183 Chronic kidney disease, stage 3 (moderate): Secondary | ICD-10-CM

## 2013-08-19 DIAGNOSIS — F17200 Nicotine dependence, unspecified, uncomplicated: Secondary | ICD-10-CM | POA: Insufficient documentation

## 2013-08-19 DIAGNOSIS — E11621 Type 2 diabetes mellitus with foot ulcer: Secondary | ICD-10-CM | POA: Diagnosis present

## 2013-08-19 DIAGNOSIS — I129 Hypertensive chronic kidney disease with stage 1 through stage 4 chronic kidney disease, or unspecified chronic kidney disease: Secondary | ICD-10-CM | POA: Insufficient documentation

## 2013-08-19 DIAGNOSIS — Z794 Long term (current) use of insulin: Secondary | ICD-10-CM | POA: Insufficient documentation

## 2013-08-19 DIAGNOSIS — D649 Anemia, unspecified: Secondary | ICD-10-CM | POA: Insufficient documentation

## 2013-08-19 DIAGNOSIS — I10 Essential (primary) hypertension: Secondary | ICD-10-CM

## 2013-08-19 DIAGNOSIS — K219 Gastro-esophageal reflux disease without esophagitis: Secondary | ICD-10-CM | POA: Insufficient documentation

## 2013-08-19 DIAGNOSIS — H538 Other visual disturbances: Secondary | ICD-10-CM | POA: Insufficient documentation

## 2013-08-19 DIAGNOSIS — E1159 Type 2 diabetes mellitus with other circulatory complications: Secondary | ICD-10-CM | POA: Insufficient documentation

## 2013-08-19 DIAGNOSIS — R809 Proteinuria, unspecified: Secondary | ICD-10-CM | POA: Diagnosis present

## 2013-08-19 DIAGNOSIS — Z91199 Patient's noncompliance with other medical treatment and regimen due to unspecified reason: Secondary | ICD-10-CM | POA: Insufficient documentation

## 2013-08-19 DIAGNOSIS — IMO0001 Reserved for inherently not codable concepts without codable children: Secondary | ICD-10-CM | POA: Diagnosis present

## 2013-08-19 HISTORY — DX: Chronic kidney disease, unspecified: N18.9

## 2013-08-19 LAB — MICROALBUMIN / CREATININE URINE RATIO
Creatinine, Urine: 64.5 mg/dL
MICROALB UR: 102.02 mg/dL — AB (ref 0.00–1.89)
MICROALB/CREAT RATIO: 1581.7 mg/g — AB (ref 0.0–30.0)

## 2013-08-19 LAB — URINALYSIS, ROUTINE W REFLEX MICROSCOPIC
Bilirubin Urine: NEGATIVE
Bilirubin Urine: NEGATIVE
GLUCOSE, UA: NEGATIVE mg/dL
GLUCOSE, UA: 100 mg/dL — AB
Ketones, ur: NEGATIVE mg/dL
Ketones, ur: NEGATIVE mg/dL
LEUKOCYTES UA: NEGATIVE
Leukocytes, UA: NEGATIVE
Nitrite: NEGATIVE
Nitrite: NEGATIVE
PH: 5 (ref 5.0–8.0)
PROTEIN: 100 mg/dL — AB
Protein, ur: 100 mg/dL — AB
Specific Gravity, Urine: 1.013 (ref 1.005–1.030)
Specific Gravity, Urine: 1.013 (ref 1.005–1.030)
UROBILINOGEN UA: 0.2 mg/dL (ref 0.0–1.0)
Urobilinogen, UA: 0.2 mg/dL (ref 0.0–1.0)
pH: 5.5 (ref 5.0–8.0)

## 2013-08-19 LAB — LIPID PANEL
CHOLESTEROL: 212 mg/dL — AB (ref 0–200)
HDL: 31 mg/dL — ABNORMAL LOW (ref >39)
LDL Cholesterol: 154 mg/dL — ABNORMAL HIGH (ref 0–99)
Total CHOL/HDL Ratio: 6.8 Ratio
Triglycerides: 133 mg/dL (ref <150)
VLDL: 27 mg/dL (ref 0–40)

## 2013-08-19 LAB — BASIC METABOLIC PANEL WITH GFR
BUN: 65 mg/dL — AB (ref 6–23)
BUN: 67 mg/dL — ABNORMAL HIGH (ref 6–23)
CHLORIDE: 109 meq/L (ref 96–112)
CO2: 21 mEq/L (ref 19–32)
CO2: 19 meq/L (ref 19–32)
CREATININE: 3.63 mg/dL — AB (ref 0.50–1.35)
Calcium: 9 mg/dL (ref 8.4–10.5)
Calcium: 9.3 mg/dL (ref 8.4–10.5)
Chloride: 104 mEq/L (ref 96–112)
Creat: 3.37 mg/dL — ABNORMAL HIGH (ref 0.50–1.35)
GFR, EST NON AFRICAN AMERICAN: 19 mL/min — AB
GFR, Est African American: 24 mL/min — ABNORMAL LOW
GFR, Est African American: 22 mL/min — ABNORMAL LOW
GFR, Est Non African American: 21 mL/min — ABNORMAL LOW
GLUCOSE: 84 mg/dL (ref 70–99)
Glucose, Bld: 120 mg/dL — ABNORMAL HIGH (ref 70–99)
POTASSIUM: 5.8 meq/L — AB (ref 3.5–5.3)
Potassium: 5.8 mEq/L — ABNORMAL HIGH (ref 3.5–5.3)
Sodium: 138 mEq/L (ref 135–145)
Sodium: 138 mEq/L (ref 135–145)

## 2013-08-19 LAB — MAGNESIUM: Magnesium: 1.8 mg/dL (ref 1.5–2.5)

## 2013-08-19 LAB — GLUCOSE, CAPILLARY
GLUCOSE-CAPILLARY: 148 mg/dL — AB (ref 70–99)
GLUCOSE-CAPILLARY: 156 mg/dL — AB (ref 70–99)
Glucose-Capillary: 139 mg/dL — ABNORMAL HIGH (ref 70–99)

## 2013-08-19 LAB — URINE MICROSCOPIC-ADD ON

## 2013-08-19 LAB — URINALYSIS, MICROSCOPIC ONLY
Bacteria, UA: NONE SEEN
Casts: NONE SEEN
Crystals: NONE SEEN
SQUAMOUS EPITHELIAL / LPF: NONE SEEN

## 2013-08-19 LAB — URIC ACID: Uric Acid, Serum: 8.8 mg/dL — ABNORMAL HIGH (ref 4.0–7.8)

## 2013-08-19 LAB — PHOSPHORUS: Phosphorus: 4.9 mg/dL — ABNORMAL HIGH (ref 2.3–4.6)

## 2013-08-19 MED ORDER — HEPARIN SODIUM (PORCINE) 5000 UNIT/ML IJ SOLN
5000.0000 [IU] | Freq: Three times a day (TID) | INTRAMUSCULAR | Status: DC
  Filled 2013-08-19 (×5): qty 1

## 2013-08-19 MED ORDER — AMLODIPINE BESYLATE 5 MG PO TABS
5.0000 mg | ORAL_TABLET | Freq: Every day | ORAL | Status: DC
  Administered 2013-08-20: 5 mg via ORAL
  Filled 2013-08-19: qty 1

## 2013-08-19 MED ORDER — SODIUM POLYSTYRENE SULFONATE 15 GM/60ML PO SUSP
15.0000 g | Freq: Once | ORAL | Status: AC
  Administered 2013-08-19: 15 g via ORAL
  Filled 2013-08-19: qty 60

## 2013-08-19 MED ORDER — SIMVASTATIN 40 MG PO TABS
40.0000 mg | ORAL_TABLET | Freq: Every day | ORAL | Status: DC
  Administered 2013-08-19: 40 mg via ORAL
  Filled 2013-08-19 (×2): qty 1

## 2013-08-19 MED ORDER — INSULIN ASPART 100 UNIT/ML ~~LOC~~ SOLN
0.0000 [IU] | Freq: Three times a day (TID) | SUBCUTANEOUS | Status: DC
  Administered 2013-08-19: 2 [IU] via SUBCUTANEOUS
  Administered 2013-08-20: 1 [IU] via SUBCUTANEOUS

## 2013-08-19 NOTE — Assessment & Plan Note (Signed)
Potassium 5.8. On repeat 08/19/13, K remains 5.8. Since it is Friday and we have no way to monitor his potassium as an outpatient until Monday, will admit to obs and treat with Kayexalate.

## 2013-08-19 NOTE — Progress Notes (Signed)
Patient ID: Austin Mcdaniel, male   DOB: [birth year], 43 y.o.   MRN: [id number]   Mr [name] was seen in the clinic by me yesterday. A BMP was checked as the last labs he had were in August '14. His Cr 3.37 and K 5.8. Pt was notified and brought back to the clinic today for repeat BMP, which shows K 5.8 and Cr 3.63. EKG is normal sinus rhythm and is unchanged from previous EKGs.   He did not have an anion gap yesterday, but his AG is 15 today with decreased bicarb from 21 to 19.   Vitals are stable: Temp 97.3  HR 86  RR 20  BP 183/94  O2 sat 99%  General: Alert & oriented x 3, well-developed, and cooperative on examination.  Head: Normocephalic and atraumatic.  Eyes: PERRL, EOMI Lungs: CTAB, normal respiratory effort, no accessory muscle use, no crackles, and no wheezes. Heart: Regular rate, regular rhythm, no murmur Abdomen: Soft, non-tender, non-distended Msk: L BKA Extremities: 1+ pitting edema to RLE Neurologic: Alert & oriented X3, cranial nerves II-XII intact, no focal deficits Psych: Normal mood and affect   Recent Labs Lab 08/18/13 1153 08/19/13 1109  CREATININE 3.37* 3.63*    Recent Labs Lab 08/18/13 1153 08/19/13 1109  K 5.8* 5.8*    He has not had labs in a year, so unsure how acute these changes are, likely that his lisinopril is contributing, but he will need to be treated for the hyperkalemia and have his potassium monitored.  - Will admit for observation for hyperkalemia and worsening renal function with anion gap. - His lisinopril will need to be discontinued - He will likely need a Renal consult while in the hospital  The patient was discussed with both Dr. Meredith PelJoines and Dr. Cyndie ChimeGranfortuna

## 2013-08-19 NOTE — Progress Notes (Signed)
Md and admission notified that patient has arrived to unit.

## 2013-08-19 NOTE — Progress Notes (Unsigned)
Pt came into clinic for repeat labs, hyperkalemia BP 183/94 pulse 68 resp 20  sats 99% Temp. 97.2 EKG done and NSR Admit for elevated K and  worsening renal function with anion gap. Report called to 2000

## 2013-08-19 NOTE — Assessment & Plan Note (Addendum)
Worsening renal function and hyperkalemia, and still with proteinuria, now with AG and decreased bicarb. He has [facility] but did not f/u due to the distance driving, please refer to note from yesterday. He requested a referral to Nephrology here in [place]. - Admitting for hyperkalemia and pt will also need a Renal consult for worsening of his renal function and proteinuria.

## 2013-08-19 NOTE — Progress Notes (Signed)
Call md and Dr Bonney AidGull return call. Iv team paged for IV

## 2013-08-19 NOTE — H&P (Signed)
Date: 08/19/2013               Patient Name:  Austin Mcdaniel MRN: [id number]  DOB: August 03, 1970 Age / Sex: 43 y.o., male   PCP: Otho Bellows, MD           Medical Service: Internal Medicine Teaching Service         Attending Physician: Dr. Axel Filler, MD    First Contact: Dr. Michail Jewels, MD Pager: [phone] (7AM-5PM Mon-Fri)  Second Contact: Dr. Jessee Avers, MD Pager: [phone]       After Hours (After 5p/  First Contact Pager: [phone]  weekends / holidays): Second Contact Pager: [phone]    Most Recent Discharge Date:  10/14/12     History of Present Illness:  Austin Mcdaniel is a 43 y.o. male who has a past medical history of controlled DMII diagnosed in his 31s (08/18/13 HA1c 5.9), CKD4, osteomyelitis of left foot s/p BKA, HTN, and  dyslipidemia.  He presents to the [facility] with worsening creatinine and hyperkalemia.  He denies any fever/chills, CP, SOB, orthopnea, PND, abdominal pain, N/V/D/C, urinary symptoms, or change in bowel habits.  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  Also endorses blurry vision in his left eye.  No cardiac history.  No h/o autoimmune disease.  Denies any regular NSAID use but does report using ibuprofen occasionally. He has been eating and drinking normally.  He drinks 3 beers/wk, smokes a cigar 1/wk, but denies any other recreational drug use.    Workup so far includes Cr: 3.63, K: 5.8, CO2: 19.  EKG without acute changes.    Meds: Current Facility-Administered Medications  Medication Dose Route Frequency Provider Last Rate Last Dose  . [START ON 08/20/2013] amLODipine (NORVASC) tablet 5 mg  5 mg Oral Daily Jessee Avers, MD      . heparin injection 5,000 Units  5,000 Units Subcutaneous 3 times per day Jessee Avers, MD      . insulin aspart (novoLOG) injection 0-9 Units  0-9 Units Subcutaneous TID WC Jessee Avers, MD      . simvastatin (ZOCOR) tablet 40 mg  40 mg Oral Q supper Jessee Avers, MD        . sodium polystyrene (KAYEXALATE) 15 GM/60ML suspension 15 g  15 g Oral Once Jessee Avers, MD        Prescriptions prior to admission  Medication Sig Dispense Refill  . hydrochlorothiazide (HYDRODIURIL) 25 MG tablet Take 1 tablet (25 mg total) by mouth daily.  30 tablet  1  . ibuprofen (ADVIL,MOTRIN) 200 MG tablet Take 400-600 mg by mouth 4 (four) times daily as needed for moderate pain.       Marland Kitchen insulin NPH-regular Human (NOVOLIN 70/30) (70-30) 100 UNIT/ML injection Inject 10 Units into the skin 2 (two) times daily with a meal. Inject 10 units twice daily - breakfast and dinner.  10 mL  11  . lisinopril (PRINIVIL,ZESTRIL) 20 MG tablet Take 20 mg by mouth.      . Neomycin-Bacitracin-Polymyxin (TRIPLE ANTIBIOTIC EX) Apply 1 application topically 2 (two) times daily as needed (sore).       . simvastatin (ZOCOR) 40 MG tablet Take 40 mg by mouth daily with supper.      Marland Kitchen amLODipine (NORVASC) 5 MG tablet Take 1 tablet (5 mg total) by mouth daily.  30 tablet  3  . guaiFENesin (MUCINEX) 600 MG 12 hr tablet Take 1 tablet (  600 mg total) by mouth 2 (two) times daily.  60 tablet  2    Allergies: Allergies as of 08/19/2013  . (No Known Allergies)    PMH: Past Medical History  Diagnosis Date  . Acid reflux   . Diabetic foot ulcer 06/2011; 10/2011-12/2011    left dorsal foot  (01/03/2012)  . Osteomyelitis of left foot   . Type II diabetes mellitus   . Hypertension   . Chronic kidney disease     CKD STAGE 3    PSH: Past Surgical History  Procedure Laterality Date  . Toe amputation  2009; 2012?    left great; left 2nd toe  . Amputation  01/06/2012    Procedure: AMPUTATION BELOW KNEE;  Surgeon: Wylene Simmer, MD;  Location: [place];  Service: Orthopedics;  Laterality: Left;  DR.HEWITT WOULD LIKE TO FOLLOW HIS OTHER AFTERNOON CASE STARTING AROUND [phone]    FH: History reviewed. No pertinent family history.  SH: History  Substance Use Topics  . Smoking status: Current Some Day Smoker --  0.10 packs/day for 7 years    Types: Cigars  . Smokeless tobacco: Current User    Types: Snuff     Comment: 01/03/2012 "quit cigarettes 15-20 yr ago; occasionally smoke a pipe; dip rarely - last time ~ 3 months ago"  07/2013 "occasional cigar"  . Alcohol Use: 1.2 oz/week    2 Cans of beer per week     Comment: 01/03/2012 "couple beers once/wk"    Review of Systems: Pertinent items are noted in HPI.  Physical Exam: BP 116/85  Pulse 69  Temp 97.5 F (36.4 C) (Oral)  Ht _0  (1.88 m)  Wt 267 lb 10.2 oz (121.4 kg)  BMI 34.35 kg/m2  SpO2 99% on RA  Physical Exam Constitutional: Vital signs reviewed.  Patient is a well-developed and well-nourished male in no acute distress and cooperative with exam.  Head: Normocephalic and atraumatic Eyes: PERRL, EOMI, conjunctivae normal, no scleral icterus.  Neck: Supple, Trachea midline .  Cardiovascular: RRR, no MRG, pulses symmetric and intact bilaterally Pulmonary/Chest: normal respiratory effort, CTAB, no wheezes, rales, or rhonchi Abdominal: Soft. Non-tender, non-distended, bowel sounds are normal, no masses, organomegaly, or guarding present.  Neurological: A&O x3, cranial nerve II-XII are grossly intact, no focal motor deficit Extremities: L BKA, RLE 1+ pitting edema  Skin: Warm, dry and intact. No rash, cyanosis, or clubbing.  Psychiatric: Normal mood and affect.   Lab results:  Basic Metabolic Panel:  Recent Labs  08/18/13 1153 08/19/13 1109  NA 138 138  K 5.8* 5.8*  CL 109 104  CO2 21 19  GLUCOSE 84 120*  BUN 65* 67*  CREATININE 3.37* 3.63*  CALCIUM 9.0 9.3   Anion Gap: 22  Calcium/Magnesium/Phosphorus:  Recent Labs Lab 08/18/13 1153 08/19/13 1109  CALCIUM 9.0 9.3    Liver Function Tests: No results found for this basename: AST, ALT, ALKPHOS, BILITOT, PROT, ALBUMIN,  in the last 72 hours No results found for this basename: LIPASE, AMYLASE,  in the last 72 hours No results found for this basename: AMMONIA,  in  the last 72 hours  CBC: Lab Results  Component Value Date   WBC 9.3 01/08/2012   HGB 8.8* 01/08/2012   HCT 25.6* 01/08/2012   MCV 87.4 01/08/2012   PLT 348 01/08/2012    Lipase: No results found for this basename: LIPASE    Lactic Acid/Procalcitonin: No results found for this basename: LATICACIDVEN, PROCALCITON, O2SATVEN,  in the last 168 hours  Cardiac Enzymes: No results found for this basename: TROPIPOC,  in the last 72 hours No results found for this basename: CKTOTAL,  CKMB,  CKMBINDEX,  TROPONINI    BNP: No results found for this basename: PROBNP,  in the last 72 hours  D-Dimer: No results found for this basename: DDIMER,  in the last 72 hours  CBG:  Recent Labs  08/18/13 1103 08/19/13 1455 08/19/13 1635  GLUCAP 131* 148* 156*    Hemoglobin A1C:  Recent Labs  08/18/13 1112  HGBA1C 5.9    Lipid Panel:  Recent Labs  08/18/13 1153  CHOL 212*  HDL 31*  LDLCALC 154*  TRIG 133  CHOLHDL 6.8    Thyroid Function Tests: No results found for this basename: TSH, T4TOTAL, FREET4, T3FREE, THYROIDAB,  in the last 72 hours  Anemia Panel: No results found for this basename: VITAMINB12, FOLATE, FERRITIN, TIBC, IRON, RETICCTPCT,  in the last 72 hours  Coagulation: No results found for this basename: LABPROT, INR,  in the last 72 hours  Urine Drug Screen: Drugs of Abuse:  No results found for this basename: labopia,  cocainscrnur,  labbenz,  amphetmu,  thcu,  labbarb    Alcohol Level: No results found for this basename: ETH,  in the last 72 hours  Urinalysis:    Component Value Date/Time   COLORURINE YELLOW 08/18/2013 [place] 08/18/2013 1201   LABSPEC 1.013 08/18/2013 1201   PHURINE 5.5 08/18/2013 1201   GLUCOSEU NEG 08/18/2013 1201   HGBUR SMALL* 08/18/2013 1201   BILIRUBINUR NEG 08/18/2013 1201   KETONESUR NEG 08/18/2013 1201   PROTEINUR 100* 08/18/2013 1201   UROBILINOGEN 0.2 08/18/2013 1201   NITRITE NEG 08/18/2013 1201    LEUKOCYTESUR NEG 08/18/2013 1201    Imaging results:  No results found.  EKG: EKG Interpretation  Date/Time:    Ventricular Rate:    PR Interval:    QRS Duration:   QT Interval:    QTC Calculation:   R Axis:     Text Interpretation:     Antibiotics: Antibiotics Given (last 72 hours)   None      Anti-infectives   None      SIRS/Sepsis/Septic Shock criteria met:  No Consults:  Renal   Assessment & Plan by Problem: Principal Problem:   Hyperkalemia Active Problems:   DM type 2 causing vascular disease   HTN (hypertension)   HLD (hyperlipidemia)   Abnormal presence of protein in urine  Ho Parisi is a 43 y.o. male who has a past medical history of controlled DMII diagnosed in his 84s (08/18/13 HA1c 5.9), CKD4, osteomyelitis of left foot s/p BKA, HTN, and  dyslipidemia.  He presents to the [facility] with worsening creatinine and hyperkalemia.   Acute on Chronic Kidney Disease   Baseline creatinine ~1.6 one year ago.  Likely d/t diabetic nephropathy vs. hypertensive.  SPEP, UPEP neg.  ANA neg.  Pt was seen by [facility] nephrology in the past but did not want to travel that far and desires to establish care with a nephrologist in [place].  He is hyperkalemic with elevated anion gap: 22, Mg:1.8, phos: 4.9.  Urine microalb/Cr ratio: 1581.7.   Lab Results  Component Value Date   CREATININE 3.63* 08/19/2013  -FENa (1%: prerenal) or FEUrea (<35%: prerenal) -renal US  -strict I/O -BMP to monitor electolytes -consulted renal, appreciate recs -hold ACEi and HCTZ -avoid nephrotoxic agents  Anemia of chronic disease Most likely d/t CKD. 08/17/12 Hgb: 10.8.   -recheck  cbc/dif -anemia panel   Hypertension  Stable; on home meds of HCTZ 46m qd, lisinopril 240mqd, and amlodipine 62m7md.   -continue amlodipine 62mg34mold HCTZ, lisinopril in the setting of CKD  Controlled Diabetes Mellitus II  Pt noncompliant with novolin 70/30 10 units twice daily with breakfast and dinner.     Lab Results  Component Value Date   HGBA1C 5.9 08/18/2013  -d/c home meds -SSI-s -ac and hs cbg  Dyslipidemia  Pt is on simvastatin 40mg66mhome.   Lab Results  Component Value Date   LDLCALC 154* 08/18/2013  -continue statin therapy  FEN  Fluids- none Electrolytes-  Hyperkalemia - kayexalate   Nutrition- renal  VTE prophylaxis  5000 Units Heparin SQ tid  Disposition Disposition deferred at this time, awaiting improvement of current medical problems. Anticipated discharge in approximately 1-2 day(s).     Emergency Contact Contact Information   Name Relation Home Work Mobile   Martin,Jennifer Significant other   [phone] The patient does have a current PCP (KathOtho Bellows and [facility].  Signed JacquJones BalesPGY-1, Internal Medicine Teaching Service [phone] Mon-Fri) 08/19/2013, 5:14 PM

## 2013-08-19 NOTE — Progress Notes (Signed)
Attending physician note: Presenting problems, physical findings, and laboratory data reviewed with resident physician Dr. Madelaine EtienneKatherine [name]. The patient has had a significant deterioration in his renal function compared with values obtained last year. There is now reproducible hyperkalemia with potassium 5.8. Progressive rise in serum creatinine. Developing anion gap acidosis. The patient will be admitted to the hospital for expedited evaluation and treatment. Cephas DarbyJames Granfortuna, M.D., FACP

## 2013-08-19 NOTE — Progress Notes (Signed)
Patient arrived to unit and change into inpatient gown and placed on tele box #[id number].. Patient with left bka and prothesis at beside. Home with significant other. Patient orient to unit.

## 2013-08-20 DIAGNOSIS — I129 Hypertensive chronic kidney disease with stage 1 through stage 4 chronic kidney disease, or unspecified chronic kidney disease: Secondary | ICD-10-CM

## 2013-08-20 DIAGNOSIS — E119 Type 2 diabetes mellitus without complications: Secondary | ICD-10-CM

## 2013-08-20 DIAGNOSIS — D638 Anemia in other chronic diseases classified elsewhere: Secondary | ICD-10-CM

## 2013-08-20 DIAGNOSIS — N184 Chronic kidney disease, stage 4 (severe): Secondary | ICD-10-CM

## 2013-08-20 DIAGNOSIS — N179 Acute kidney failure, unspecified: Secondary | ICD-10-CM

## 2013-08-20 DIAGNOSIS — E785 Hyperlipidemia, unspecified: Secondary | ICD-10-CM

## 2013-08-20 LAB — RETICULOCYTES
RBC.: 2.92 MIL/uL — ABNORMAL LOW (ref 4.22–5.81)
RETIC COUNT ABSOLUTE: 49.6 10*3/uL (ref 19.0–186.0)
Retic Ct Pct: 1.7 % (ref 0.4–3.1)

## 2013-08-20 LAB — CBC WITH DIFFERENTIAL/PLATELET
BASOS PCT: 1 % (ref 0–1)
Basophils Absolute: 0.1 10*3/uL (ref 0.0–0.1)
EOS ABS: 0.6 10*3/uL (ref 0.0–0.7)
EOS PCT: 8 % — AB (ref 0–5)
HCT: 23.4 % — ABNORMAL LOW (ref 39.0–52.0)
HEMOGLOBIN: 7.9 g/dL — AB (ref 13.0–17.0)
Lymphocytes Relative: 31 % (ref 12–46)
Lymphs Abs: 2.3 10*3/uL (ref 0.7–4.0)
MCH: 29.4 pg (ref 26.0–34.0)
MCHC: 33.8 g/dL (ref 30.0–36.0)
MCV: 87 fL (ref 78.0–100.0)
MONOS PCT: 9 % (ref 3–12)
Monocytes Absolute: 0.7 10*3/uL (ref 0.1–1.0)
NEUTROS PCT: 51 % (ref 43–77)
Neutro Abs: 3.8 10*3/uL (ref 1.7–7.7)
Platelets: 249 10*3/uL (ref 150–400)
RBC: 2.69 MIL/uL — ABNORMAL LOW (ref 4.22–5.81)
RDW: 14.1 % (ref 11.5–15.5)
WBC: 7.4 10*3/uL (ref 4.0–10.5)

## 2013-08-20 LAB — RENAL FUNCTION PANEL
Albumin: 3.2 g/dL — ABNORMAL LOW (ref 3.5–5.2)
BUN: 62 mg/dL — AB (ref 6–23)
CALCIUM: 8.5 mg/dL (ref 8.4–10.5)
CO2: 20 meq/L (ref 19–32)
CREATININE: 3.62 mg/dL — AB (ref 0.50–1.35)
Chloride: 102 mEq/L (ref 96–112)
GFR calc Af Amer: 22 mL/min — ABNORMAL LOW (ref >90)
GFR calc non Af Amer: 19 mL/min — ABNORMAL LOW (ref >90)
GLUCOSE: 131 mg/dL — AB (ref 70–99)
Phosphorus: 5.2 mg/dL — ABNORMAL HIGH (ref 2.3–4.6)
Potassium: 5.2 mEq/L (ref 3.7–5.3)
Sodium: 136 mEq/L — ABNORMAL LOW (ref 137–147)

## 2013-08-20 LAB — BASIC METABOLIC PANEL
BUN: 65 mg/dL — ABNORMAL HIGH (ref 6–23)
BUN: 61 mg/dL — AB (ref 6–23)
CALCIUM: 8.8 mg/dL (ref 8.4–10.5)
CALCIUM: 8.8 mg/dL (ref 8.4–10.5)
CO2: 20 meq/L (ref 19–32)
CO2: 20 mEq/L (ref 19–32)
CREATININE: 3.64 mg/dL — AB (ref 0.50–1.35)
Chloride: 106 mEq/L (ref 96–112)
Chloride: 102 mEq/L (ref 96–112)
Creatinine, Ser: 3.48 mg/dL — ABNORMAL HIGH (ref 0.50–1.35)
GFR calc Af Amer: 22 mL/min — ABNORMAL LOW (ref >90)
GFR calc Af Amer: 23 mL/min — ABNORMAL LOW (ref >90)
GFR calc non Af Amer: 19 mL/min — ABNORMAL LOW (ref >90)
GFR, EST NON AFRICAN AMERICAN: 20 mL/min — AB (ref >90)
GLUCOSE: 141 mg/dL — AB (ref 70–99)
Glucose, Bld: 108 mg/dL — ABNORMAL HIGH (ref 70–99)
Potassium: 5.2 mEq/L (ref 3.7–5.3)
Potassium: 5 mEq/L (ref 3.7–5.3)
Sodium: 139 mEq/L (ref 137–147)
Sodium: 137 mEq/L (ref 137–147)

## 2013-08-20 LAB — FOLATE: FOLATE: 12 ng/mL

## 2013-08-20 LAB — GLUCOSE, CAPILLARY
Glucose-Capillary: 107 mg/dL — ABNORMAL HIGH (ref 70–99)
Glucose-Capillary: 136 mg/dL — ABNORMAL HIGH (ref 70–99)

## 2013-08-20 LAB — CK: CK TOTAL: 389 U/L — AB (ref 7–232)

## 2013-08-20 LAB — IRON AND TIBC
IRON: 75 ug/dL (ref 42–135)
Saturation Ratios: 27 % (ref 20–55)
TIBC: 277 ug/dL (ref 215–435)
UIBC: 202 ug/dL (ref 125–400)

## 2013-08-20 LAB — VITAMIN B12: Vitamin B-12: 400 pg/mL (ref 211–911)

## 2013-08-20 LAB — FERRITIN: Ferritin: 241 ng/mL (ref 22–322)

## 2013-08-20 MED ORDER — PRAVASTATIN SODIUM 40 MG PO TABS: 40.0000 mg | ORAL_TABLET | Freq: Every day | ORAL | Status: DC

## 2013-08-20 MED ORDER — DARBEPOETIN ALFA-POLYSORBATE 100 MCG/0.5ML IJ SOLN
100.0000 ug | INTRAMUSCULAR | Status: DC
  Filled 2013-08-20: qty 0.5

## 2013-08-20 MED ORDER — SODIUM POLYSTYRENE SULFONATE 15 GM/60ML PO SUSP
15.0000 g | Freq: Once | ORAL | Status: AC
  Administered 2013-08-20: 15 g via ORAL
  Filled 2013-08-20: qty 60

## 2013-08-20 MED ORDER — AMLODIPINE BESYLATE 10 MG PO TABS: 10.0000 mg | ORAL_TABLET | Freq: Every day | ORAL | Status: DC

## 2013-08-20 NOTE — Care Management Utilization Note (Signed)
UR Completed.    Julie Wise Amerson, RN, BSN Phone #[phone] 

## 2013-08-20 NOTE — Consult Note (Signed)
[facility] Renal Consultation Note  Requesting MD: Joines Indication for Consultation: A on CKD vs progression of CKD with hyperkalemia  HPI:  Austin Mcdaniel is a 43 y.o. 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 Regarding his EKG, it seems as if his creatinine was in the high ones up to 2 from late 2013 to May 2014. He tells me that he has seen a nephrologist at [facility] however is looking to transition his care back here. He tells me that they really never told him much about his kidney disease or his level of kidney function. He presented to the internal medicine clinic for routine followup on 6/25. Labs were drawn in showed a creatinine of 3.6 with a potassium of 5.8. Therefore, he was called back to be admitted for medical therapy of this. He is received Kayexalate and potassium is down to 5.2 today but creatinine was unchanged. He is making urine. He denies any urinary symptoms prior to admission. Was on lisinopril prior to admission. He states that he's been on this over a year. He also uses some ibuprofen and states that he was told that he shouldn't. His blood pressure was noted to be not under great control as an outpatient although he claims compliance with medications. He was started on Norvasc last night. Blood pressure this morning is 145/75. He has had a urinalysis done here which was fairly bland and only had one 100 of protein. Renal ultrasound has been done but has not resulted. The patient said that tech told him it looked good. He reports he feels much better than he did at the time of admission. He wants to go home.  Creat  Date/Time Value Ref Range Status  08/19/2013 11:09 AM 3.63* 0.50 - 1.35 mg/dL Final  08/18/2013 11:53 AM 3.37* 0.50 - 1.35 mg/dL Final  07/07/2012  5:01 PM 1.92*  0.50 - 1.35 mg/dL Final  01/15/2012 11:45 AM 1.59* 0.50 - 1.35 mg/dL Final  07/01/2010 10:21 AM 1.02  0.40 - 1.50 mg/dL Final     Creatinine, Ser  Date/Time Value Ref Range Status  08/20/2013  3:40 AM 3.64* 0.50 - 1.35 mg/dL Final  01/08/2012  6:10 AM 1.53* 0.50 - 1.35 mg/dL Final  01/07/2012 11:47 AM 1.62* 0.50 - 1.35 mg/dL Final  01/06/2012  8:30 AM 1.93* 0.50 - 1.35 mg/dL Final  01/05/2012  7:09 PM 2.18* 0.50 - 1.35 mg/dL Final  01/05/2012 11:55 AM 2.06* 0.50 - 1.35 mg/dL Final  01/04/2012  6:17 AM 2.03* 0.50 - 1.35 mg/dL Final  01/03/2012  6:50 AM 1.99* 0.50 - 1.35 mg/dL Final  01/02/2012  5:37 PM 1.90* 0.50 - 1.35 mg/dL Final  01/02/2012 12:25 PM 1.96* 0.50 - 1.35 mg/dL Final  06/20/2010  6:50 AM 1.12  0.4 - 1.5 mg/dL Final  06/19/2010  6:30 AM 0.93  0.4 - 1.5 mg/dL Final  06/18/2010  5:51 AM 1.00  0.4 - 1.5 mg/dL Final  06/17/2010  5:21 PM 1.01  0.4 - 1.5 mg/dL Final     PMHx:   Past Medical History  Diagnosis Date  . Acid reflux   . Diabetic foot ulcer 06/2011; 10/2011-12/2011    left dorsal foot  (01/03/2012)  . Osteomyelitis of left foot   . Type II diabetes mellitus   . Hypertension   . Chronic kidney disease  CKD STAGE 3    Past Surgical History  Procedure Laterality Date  . Toe amputation  2009; 2012?    left great; left 2nd toe  . Amputation  01/06/2012    Procedure: AMPUTATION BELOW KNEE;  Surgeon: Wylene Simmer, MD;  Location: [facility];  Service: Orthopedics;  Laterality: Left;  DR.HEWITT WOULD LIKE TO FOLLOW HIS OTHER AFTERNOON CASE STARTING AROUND 1700-1715    Family Hx: History reviewed. No pertinent family history.  Social History:  reports that he has been smoking Cigars.  His smokeless tobacco use includes Snuff. He reports that he drinks about 1.2 ounces of alcohol per week. He reports that he does not use illicit drugs.  Allergies: No Known Allergies  Medications: Prior to Admission medications   Medication Sig Start Date End Date Taking? Authorizing  Provider  hydrochlorothiazide (HYDRODIURIL) 25 MG tablet Take 1 tablet (25 mg total) by mouth daily. 08/18/13  Yes Otho Bellows, MD  ibuprofen (ADVIL,MOTRIN) 200 MG tablet Take 400-600 mg by mouth 4 (four) times daily as needed for moderate pain.    Yes Historical Provider, MD  insulin NPH-regular Human (NOVOLIN 70/30) (70-30) 100 UNIT/ML injection Inject 10 Units into the skin 2 (two) times daily with a meal. Inject 10 units twice daily - breakfast and dinner. 08/18/13  Yes Otho Bellows, MD  lisinopril (PRINIVIL,ZESTRIL) 20 MG tablet Take 20 mg by mouth. 08/19/12  Yes Historical Provider, MD  Neomycin-Bacitracin-Polymyxin (TRIPLE ANTIBIOTIC EX) Apply 1 application topically 2 (two) times daily as needed (sore).    Yes Historical Provider, MD  simvastatin (ZOCOR) 40 MG tablet Take 40 mg by mouth daily with supper.   Yes Historical Provider, MD  amLODipine (NORVASC) 5 MG tablet Take 1 tablet (5 mg total) by mouth daily. 08/18/13 08/18/14  Otho Bellows, MD  guaiFENesin (MUCINEX) 600 MG 12 hr tablet Take 1 tablet (600 mg total) by mouth 2 (two) times daily. 08/18/13 08/18/14  Otho Bellows, MD    I have reviewed the patient's current medications.  Labs:  Results for orders placed during the hospital encounter of 08/19/13 (from the past 48 hour(s))  GLUCOSE, CAPILLARY     Status: Abnormal   Collection Time    08/19/13  2:55 PM      Result Value Ref Range   Glucose-Capillary 148 (*) 70 - 99 mg/dL   Comment 1 Notify RN    GLUCOSE, CAPILLARY     Status: Abnormal   Collection Time    08/19/13  4:35 PM      Result Value Ref Range   Glucose-Capillary 156 (*) 70 - 99 mg/dL   Comment 1 Notify RN    MAGNESIUM     Status: None   Collection Time    08/19/13  5:03 PM      Result Value Ref Range   Magnesium 1.8  1.5 - 2.5 mg/dL  PHOSPHORUS     Status: Abnormal   Collection Time    08/19/13  5:03 PM      Result Value Ref Range   Phosphorus 4.9 (*) 2.3 - 4.6 mg/dL  URINALYSIS, ROUTINE W REFLEX  MICROSCOPIC     Status: Abnormal   Collection Time    08/19/13  9:00 PM      Result Value Ref Range   Color, Urine YELLOW  YELLOW   APPearance CLEAR  CLEAR   Specific Gravity, Urine 1.013  1.005 - 1.030   pH 5.0  5.0 - 8.0   Glucose, UA  100 (*) NEGATIVE mg/dL   Hgb urine dipstick TRACE (*) NEGATIVE   Bilirubin Urine NEGATIVE  NEGATIVE   Ketones, ur NEGATIVE  NEGATIVE mg/dL   Protein, ur 100 (*) NEGATIVE mg/dL   Urobilinogen, UA 0.2  0.0 - 1.0 mg/dL   Nitrite NEGATIVE  NEGATIVE   Leukocytes, UA NEGATIVE  NEGATIVE  URIC ACID     Status: Abnormal   Collection Time    08/19/13  9:00 PM      Result Value Ref Range   Uric Acid, Serum 8.8 (*) 4.0 - 7.8 mg/dL  URINE MICROSCOPIC-ADD ON     Status: Abnormal   Collection Time    08/19/13  9:00 PM      Result Value Ref Range   Squamous Epithelial / LPF RARE  RARE   WBC, UA 0-2  <3 WBC/hpf   RBC / HPF 0-2  <3 RBC/hpf   Casts HYALINE CASTS (*) NEGATIVE   Urine-Other MUCOUS PRESENT    GLUCOSE, CAPILLARY     Status: Abnormal   Collection Time    08/19/13 10:11 PM      Result Value Ref Range   Glucose-Capillary 139 (*) 70 - 99 mg/dL   Comment 1 Notify RN    BASIC METABOLIC PANEL     Status: Abnormal   Collection Time    08/20/13  3:40 AM      Result Value Ref Range   Sodium 139  137 - 147 mEq/L   Potassium 5.2  3.7 - 5.3 mEq/L   Chloride 106  96 - 112 mEq/L   CO2 20  19 - 32 mEq/L   Glucose, Bld 108 (*) 70 - 99 mg/dL   BUN 65 (*) 6 - 23 mg/dL   Creatinine, Ser 3.64 (*) 0.50 - 1.35 mg/dL   Calcium 8.8  8.4 - 10.5 mg/dL   GFR calc non Af Amer 19 (*) >90 mL/min   GFR calc Af Amer 22 (*) >90 mL/min   Comment: (NOTE)     The eGFR has been calculated using the CKD EPI equation.     This calculation has not been validated in all clinical situations.     eGFR's persistently <90 mL/min signify possible Chronic Kidney     Disease.  CBC WITH DIFFERENTIAL     Status: Abnormal   Collection Time    08/20/13  3:40 AM      Result Value Ref  Range   WBC 7.4  4.0 - 10.5 K/uL   RBC 2.69 (*) 4.22 - 5.81 MIL/uL   Hemoglobin 7.9 (*) 13.0 - 17.0 g/dL   HCT 23.4 (*) 39.0 - 52.0 %   MCV 87.0  78.0 - 100.0 fL   MCH 29.4  26.0 - 34.0 pg   MCHC 33.8  30.0 - 36.0 g/dL   RDW 14.1  11.5 - 15.5 %   Platelets 249  150 - 400 K/uL   Neutrophils Relative % 51  43 - 77 %   Neutro Abs 3.8  1.7 - 7.7 K/uL   Lymphocytes Relative 31  12 - 46 %   Lymphs Abs 2.3  0.7 - 4.0 K/uL   Monocytes Relative 9  3 - 12 %   Monocytes Absolute 0.7  0.1 - 1.0 K/uL   Eosinophils Relative 8 (*) 0 - 5 %   Eosinophils Absolute 0.6  0.0 - 0.7 K/uL   Basophils Relative 1  0 - 1 %   Basophils Absolute 0.1  0.0 - 0.1 K/uL  GLUCOSE, CAPILLARY     Status: Abnormal   Collection Time    08/20/13  6:06 AM      Result Value Ref Range   Glucose-Capillary 107 (*) 70 - 99 mg/dL   Comment 1 Documented in Chart     Comment 2 Notify RN    RETICULOCYTES     Status: Abnormal   Collection Time    08/20/13  9:09 AM      Result Value Ref Range   Retic Ct Pct 1.7  0.4 - 3.1 %   RBC. 2.92 (*) 4.22 - 5.81 MIL/uL   Retic Count, Manual 49.6  19.0 - 186.0 K/uL  CK     Status: Abnormal   Collection Time    08/20/13  9:09 AM      Result Value Ref Range   Total CK 389 (*) 7 - 232 U/L  GLUCOSE, CAPILLARY     Status: Abnormal   Collection Time    08/20/13 11:06 AM      Result Value Ref Range   Glucose-Capillary 136 (*) 70 - 99 mg/dL   Comment 1 Notify RN       ROS:  A comprehensive review of systems was negative.  Physical Exam: Filed Vitals:   08/20/13 0410  BP: 145/75  Pulse: 65  Temp: 98.7 F (37.1 C)  Resp: 19     General:  Well-developed white male. He is alert, oriented and in no acute distress HEENT: Pupils are equal round reactive to light, extra motions are intact, mucous membranes are moist Neck: There is no jugular venous distention, no carotid bruits no lymphadenopathy Heart: Regular rate and rhythm without murmur Lungs: Clear to auscultation  bilaterally without wheezes Abdomen: Soft, nontender, nondistended. Extremities: 0 trace edema to lower extremities. He has a left below-the-knee amputation. Skin: Warm and dry Neuro: Alert and oriented. The remainder of the neurologic exam is nonfocal  Assessment/Plan: 43 year old white male with long-standing diabetes and known history of chronic kidney disease. He now presents with either acute on chronic kidney disease or disease progression with hyperkalemia. 1.Renal- it seems that it has been a year since his last laboratory values. His creatinine was 1.9 at that time. It definitely been a change/deterioration of kidney function. Unclear if this is an acute change versus chronic progression of likely diabetic kidney disease. Renal function has remained fairly stable since Thursday. His urinalysis is bland. His renal ultrasound is negative for obstruction per the patient. I agree with holding his ACE inhibitor as it is possible it could of precipitated some acute on chronic kidney injury. I also told him to discontinue use of ibuprofen altogether.  I attempted to explain to him where his kidney function is now compared where it was a year ago. We will need to just avoid obvious nephrotoxins and follow this and see where it settles out which will be his new baseline. I will check an SPEP for completeness sake. 2. hyperkalemia  - related to his CKD well as lisinopril. It has come down with discontinuation of lisinopril and medical therapy. He did tell him about food which are very high in potassium that he should avoid for the time being. However, I anticipate that with discontinuation of the lisinopril this would not be an issue any longer. 3 hypertension/volume- it seems that this has been poorly controlled of late. This could have precipitated some acute on chronic injury as well. Lisinopril is on hold and he's been started on Norvasc. I believe this  is a good medication for him to continue for the  future.  4. Anemia  - seems out of proportion to his level of CKD. I agree with checking iron stores and other labs. I will also give a dose of Aranesp and assess for further need as an OP. 5. Bones- I will also check an intact PTH, phosphorus was within normal limits.    Thank for this consultation.  I Have ordered labs as above and will give a dose of Aranesp. Patient was light to be discharges he has a Copy. I do not have a problem with this. I have given him my card to arrange followup and I will also arrange followup from my end for him to see [place] at [place].    [name],KELLIE A 08/20/2013, 12:49 PM

## 2013-08-20 NOTE — Discharge Instructions (Signed)
Please keep your follow-up appointments; this is very important for your continued recovery.    We have made the following additions/changes to your medications:  Increased your amlodipine to 10mg  daily, please continue to take hydrochlorothiazide 25mg  daily.  Please stop taking lisinopril.   Please do not take any NSAIDS such as ibuprofen, naproxen, etc.   Please continue to take all of your medications as prescribed.  Do not miss any doses without contacting your primary physician.  If you have questions, please contact your physician or contact the Internal Medicine Teaching Service at [phone].  Please bring your medicications with you to your appointments; medications may be eye drops, herbals, vitamins, or pills.    If you believe you are suffering from a life-threatening emergency, go to the nearest Emergency Department.      [employer] Guide  1) Find a Scientist, water quality Although you won't have to find out who is covered by [employer] plan, it is a good idea to ask around and get recommendations. You will then need to call the office and see if the doctor you have chosen will accept you as a new patient and what types of options they offer for patients who are self-pay. Some doctors offer discounts or will set up payment plans for their patients who do not have insurance, but you will need to ask so you aren't surprised when you get to your appointment.  2) Contact Your Local Health Department Not all health departments have doctors that can see patients for sick visits, but many do, so it is worth a call to see if yours does. If you don't know where your local health department is, you can check in your phone book. The CDC also has a tool to help you locate your state's health department, and many state websites also have listings of all of their local health departments.  3) Find a Walk-in Clinic If your illness is not likely to be very severe or  complicated, you may want to try a walk in clinic. These are popping up all over the country in pharmacies, drugstores, and shopping centers. They're usually staffed by nurse practitioners or physician assistants that have been trained to treat common illnesses and complaints. They're usually fairly quick and inexpensive. However, if you have serious medical issues or chronic medical problems, these are probably not your best option.  No Primary Care Doctor: - Call Health Connect at  [phone] - they can help you locate a primary care doctor that  accepts your insurance, provides certain services, etc. - Physician Referral Service- [phone]  Chronic Pain Problems: Organization         Address  Phone   Notes  [facility]  [phone] Patients need to be referred by their primary care doctor.   Medication Assistance: Organization         Address  Phone   Notes  [facility] Medication [facility] [address] [place], [place] [address] [phone] --Must be a resident of [facility] -- Must have NO insurance coverage whatsoever (no Medicaid/ Medicare, etc.) -- The pt. MUST have a primary care doctor that directs their care regularly and follows them in the community   MedAssist  [phone]   [employer]  [phone]    Agencies that provide inexpensive medical care: Organization         Address  Phone   Notes  [facility]  Family Medicine  [phone](336) [phone]   [facility]    [phone](336) [phone]   [facility] [address] [place], [place] [address] [phone]   [facility] [address], [place] [phone]) [phone]   [facility]anned Parenthood    [phone](336) [phone]   [facility]    [phone]   Community Health and [facility]  [address], [place] Phone:  [phone]) [phone], Fax:  [phone](336) [phone] Hours of Operation:  9 am - 6 pm, M-F.  Also accepts  Medicaid/Medicare and self-pay.  [facility]  [address], [place] Phone: [phone]) [phone], Fax: [phone]) [phone]. Hours of Operation:  8:30 am - 5:30 pm, M-F.  Also accepts Medicaid and self-pay.  [facility] [place] [address], [place] Phone: [phone]) [phone]   [facility] [address], [place], [place] [phone], Ext. [phone] Mondays & Thursdays: 7-9 AM.  First 15 patients are seen on a first come, first serve basis.    Medicaid-accepting [facility] Providers:  Organization         Address  Phone   Notes  [facility] [address], [place] [phone] Also accepts self-pay patients.  [facility] [address], [place]  [phone]) [phone]   [facility] [address], [place] [phone]) [phone]   [facility] [address] [place] [address], [place] [phone]) [phone]   Renaye RakersVeita Bland [address], [place]   [phone] Only accepts [place] Access [place]icaid patients after they have their name applied to their card.   Self-Pay (no insurance) in [facility]:  Organization         Address  Phone   Notes  Sickle Cell Patients, [facility] [address], [place] [phone]) [phone]   [facility] [address], [place] [phone]) [phone]   [facility] [place]  [address], [place] [phone]) [phone]   [facility]/Dr. Osei-Bonsu  [address] [place] [address], [place] or [phone], [place] [phone]) [phone] Phone number for both [place] and [place] locations is the same.  [facility]gent [facility] and [facility] [address], [place] [phone]   [facility] [address] [place] [address], [place] or [address] [phone]   [facility] [address], [place] [phone](336)  [phone], phone; [phone](336) [phone], fax Sees patients 1st and 3rd Saturday of every month.  Must not qualify for public or private insurance (i.e. Medicaid, Medicare, [place] Health Choice, Veterans' Benefits)  Household income should be no more than 200% of the poverty level The clinic cannot treat you if you are pregnant or think you are pregnant  Sexually transmitted diseases are not treated at the clinic.    Dental Care: Organization         Address  Phone  Notes  [facility] of [facility] [address], [place] [phone]) [phone] Accepts children up to age 43 who are enrolled in [place]icaid or [place] Health Choice; pregnant women with a Medicaid card; and children who have applied for Medicaid or [place] Health Choice, but were declined, whose parents can pay a reduced fee at time of service.  [facility] Department of [facility]  [place]  [address], [place] [phone]) [phone] Accepts children up to age 43 who are enrolled in Medicaid or [place] Health Choice; pregnant women with a Medicaid card; and children who have applied for Medicaid or [place] Health Choice, but were declined, whose parents can pay a reduced fee at time of service.  [facility]  [name], [place] [phone]) [phone] Patients are seen by appointment only. Walk-ins are not accepted. [facility] will see patients 43 years of age and older. Monday - Tuesday (8am-5pm) Most Wednesdays (8:30-5pm) $30 per visit, cash only  [facility]  [address], [place] [facility] [phone]) [phone] Patients are seen by appointment only. Walk-ins are not accepted. [facility] will see patients 43 years of age and older. One Wednesday Evening (Monthly: Volunteer Based).  $30 per visit, cash only  [employer] [facility]  [phone]) [phone] for adults; Children under age 324, call Graduate Pediatric Dentistry at [phone]. Children aged  314-14, please call [phone]) [phone] to request a pediatric application.  Dental services are provided in all areas of dental care including fillings, crowns and bridges, complete and partial dentures, implants, gum treatment, root canals, and extractions. Preventive care is also provided. Treatment is provided to both adults and children. Patients are selected via a lottery and there is often a waiting list.   [facility] [address], [place]  [phone]) [phone] [email or web address]   [facility] [address], [place], [place] [phone], Ext. [phone] Second and Fourth Thursday of each month, opens at 6:30 AM; Clinic ends at 9 AM.  Patients are seen on a first-come first-served basis, and a limited number are seen during each clinic.   [facility]  [address], [place], [place] [phone]) [phone]   Eligibility Requirements You must have lived in [place], [place], or [place] counties for at least the last three months.   You cannot be eligible for state or federal sponsored National Cityhealthcare insurance, including CIGNAVeterans Administration, [place]icaid, or [employer]Medicare.   You generally cannot be eligible for healthcare insurance through your employer.    How to apply: Eligibility screenings are held every Tuesday and Wednesday afternoon from 1:00 pm until 4:00 pm. You do not need an appointment for the interview!  [facility] [address], [place], [place] [phone]   [facility]  [phone]   [facility]  [phone]   [facility]  [phone]    Behavioral Health Resources in the Community: Intensive Outpatient Programs Organization         Address  Phone  Notes  [facility] [address], [place], [place] [phone]   [facility] [address], [place], [place] [phone]   [facility] [address],  [place], [place]  [phone]   [facility] [address],  [place], [place] [phone] or [phone]   Substance Abuse Resources Organization         Address  Phone  Notes  [facility] Drug [facility]  [phone]   [facility]  [phone]   [facility]  [phone]   [facility]  [phone]   Residential & Outpatient Substance Abuse Program  [phone]   Psychological Services Organization         Address  Phone  Notes  [facility]  [phone]   [facility]  [phone]   [facility] [address], [place] [phone] or [phone]    Mobile Crisis Teams Organization         Address  Phone  Notes  [facility], Mobile Crisis Care Unit  [phone]   [facility]  [address] [place], [place] [phone]   Doristine Locks [address] [place] [phone]    Self-Help/Support Groups Organization         Address  Phone             Notes  [facility] [place] - variety of support groups  [phone]- [phone] Call for more information  Narcotics Anonymous (NA), [facility]aring [facility] [address], [employer] [place]  2 meetings at this location   Statistician         Address  Phone  Notes  [facility] [address],    [place]  [phone]   [facility]  [address], [place] [address], [place], [place] [phone]   [facility] [address] [place], [place] [phone] Admissions: 8am-3pm M-F  [facility] [address],    [place], [place] [phone]   [facility] [address] [place], [place], [place] [phone]   [facility] [address],  [place], [place] [phone]   [facility] - Intensive Outpatient [address], [place], [place] [phone]   [facility]  (Addiction Recovery Care [facility] [address]  [place], [place] [phone] or [phone]   [facility] (RTS) [address], [place], [place] [phone] Accepts Medicaid  [facility] [address],  [place] [phone] Substance Abuse/Addiction Treatment   [facility] Organization         Address  Phone  Notes  [facility]  [phone]   Angie Fava, PhD [address] [place], [place]   [phone] or [phone]   [facility]   [address] [place], [place] [phone]   [facility] [address] [place], [place] [phone] Insurance/Medicaid/sponsorship through [facility] Families [address]                                    [place], [place] [phone] Therapy/tele-psych/case  [facility] [address], [place] [phone]    Dr. Lolly Mustache  [phone]   [facility]  [employer] Advanced Pain Management Dept. 1) [address], [place] 2) [address], [place] 3)  [address], [place] [phone]  [phone]   [facility] Child Abuse Hotline [phone] or 7731628116 (After Hours)

## 2013-08-20 NOTE — H&P (Signed)
Internal Medicine Attending Admission Note Date: 08/20/2013  Patient name: Austin HedgeSteven Mcdaniel Medical record number: [id number] Date of birth: 04/30/1970 Age: 43 y.o. Gender: male  I saw and evaluated the patient. I reviewed the resident's note and I agree with the resident's findings and plan as documented in the resident's note, with the following additional comments.  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  Patient had presented for routine visit one day prior to admission.  He has no complaints, and reports that he has been feeling well.   Physical Exam - key components related to admission:  Filed Vitals:   08/19/13 1449 08/19/13 2208 08/20/13 0410  BP: 116/85 188/85 145/75  Pulse: 69 68 65  Temp: 97.5 F (36.4 C) 98.2 F (36.8 C) 98.7 F (37.1 C)  TempSrc: Oral Oral Oral  Resp:  18 19  Height: 6\' 2"  (1.88 m)    Weight: 267 lb 10.2 oz (121.4 kg)  267 lb 6.7 oz (121.3 kg)  SpO2: 99% 100% 99%    General: Alert, no distress Lungs: Clear Heart: Regular; S1-S2, no S3, no S4, no murmurs Abdomen: Bowel sounds present, soft, nontender Extremities: No edema  Lab results:   Basic Metabolic Panel:  Recent Labs  40/98/1106/26/15 1109 08/19/13 1703 08/20/13 0340  NA 138  --  139  K 5.8*  --  5.2  CL 104  --  106  CO2 19  --  20  GLUCOSE 120*  --  108*  BUN 67*  --  65*  CREATININE 3.63*  --  3.64*  CALCIUM 9.3  --  8.8  MG  --  1.8  --   PHOS  --  4.9*  --    Lab Results  Component Value Date   CREATININE 3.64* 08/20/2013   CREATININE 3.63* 08/19/2013   CREATININE 3.37* 08/18/2013   CREATININE 1.92* 07/07/2012   CREATININE 1.59*  01/15/2012   CREATININE 1.53* 01/08/2012   CREATININE 1.62* 01/07/2012   CREATININE 1.93* 01/06/2012      CBC:  Recent Labs  08/20/13 0340  WBC 7.4  HGB 7.9*  HCT 23.4*  MCV 87.0  PLT 249    Recent Labs  08/20/13 0340  NEUTROABS 3.8  LYMPHSABS 2.3  MONOABS 0.7  EOSABS 0.6  BASOSABS 0.1    Lab Results  Component Value Date   HGB 7.9* 08/20/2013   HGB 8.8* 01/08/2012   HGB 9.5* 01/07/2012   HGB 9.5* 01/06/2012   HGB 9.9* 01/05/2012   HGB 9.0* 01/05/2012   HGB 9.8* 01/04/2012   HGB 11.1* 01/03/2012     Cardiac Enzymes:  Recent Labs  08/20/13 0909  CKTOTAL 389*      CBG:  Recent Labs  08/18/13 1103 08/19/13 1455 08/19/13 1635 08/19/13 2211 08/20/13 0606 08/20/13 1106  GLUCAP 131* 148* 156* 139* 107* 136*    Hemoglobin A1C:  Recent Labs  08/18/13 1112  HGBA1C 5.9    Fasting Lipid Panel:  Recent Labs  08/18/13 1153  CHOL 212*  HDL 31*  LDLCALC 154*  TRIG 133  CHOLHDL 6.8      Anemia Panel:  Recent Labs  08/20/13 0909  RETICCTPCT 1.7  Urinalysis    Component Value Date/Time   COLORURINE YELLOW 08/19/2013 2100   APPEARANCEUR CLEAR 08/19/2013 2100   LABSPEC 1.013 08/19/2013 2100   PHURINE 5.0 08/19/2013 2100   GLUCOSEU 100* 08/19/2013 2100   HGBUR TRACE* 08/19/2013 2100   BILIRUBINUR NEGATIVE 08/19/2013 2100   KETONESUR NEGATIVE 08/19/2013 2100   PROTEINUR 100* 08/19/2013 2100   UROBILINOGEN 0.2 08/19/2013 2100   NITRITE NEGATIVE 08/19/2013 2100   LEUKOCYTESUR NEGATIVE 08/19/2013 2100    Urine microscopic:  Recent Labs  08/18/13 1201 08/19/13 2100  EPIU NONE SEEN RARE  WBCU 0-2 0-2  RBCU 0-2 0-2  BACTERIA NONE SEEN  --   LABCAST NONE SEEN HYALINE CASTS*  OTHERU  --  MUCOUS PRESENT     Other results: EKG: Normal sinus rhythm; normal ECG  Assessment & Plan by Problem:  1.  Renal failure, with worsening renal function.  Patient was seen by a nephrologist Dr[facility]Amin [facility], and a workup of his kidney disease was started there.  According to the Dr. Shanda BumpsAmin's note, he felt that there was a chronic component of kidney injury presumed secondary to diabetic nephropathy and/or hypertensive nephrosclerosis, with an acute component with differential that included IgA nephropathy, interstitial nephritis, lupus nephritis, FSGS, or other process.  Labs that were initially obtained there included negative ANA, nonreactive hepatitis B surface antigen and core antibody, nonreactive hepatitis C antibody, nonreactive hepatitis A antibody, and nonreactive RPR.  [facility] for the remaining planned workup.  He now presents with a significant worsening of his renal function and hyperkalemia.  He takes occasional ibuprofen, which may be a continuing factor.  He would prefer to have nephrology management and followup done locally.  Plan is hold ACE inhibitor and diuretic; renal ultrasound; nephrology consult; patient advised to avoid any NSAID medications.  2.  Hyperkalemia secondary to renal failure.  Potassium has normalized following treatment with Kayexalate.  Plan is monitor and treat as indicated.  3.  Anemia, normocytic.  Patient's anemia is likely due to his chronic kidney disease.  Past anemia studies were consistent with anemia of chronic disease.  Plan is follow hemoglobin; repeat anemia studies; Hemoccult stool; consider erythropoietin depending on nephrology input.  4.  Other problems and plans as per the resident physician's note.  5.  Dr. Criselda PeachesMullen will return as attending physician on Monday 08/23/23.

## 2013-08-20 NOTE — Discharge Summary (Signed)
Name: Austin Mcdaniel MRN: [id number] DOB: 25-Oct-1970 43 y.o. PCP: Genelle GatherKathryn F [name], MD _________________________________________________  Date of Admission: 08/19/2013 12:40 PM Date of Discharge: 08/20/2013 Attending Physician: Farley LyJerry Dale Joines, MD   Discharge Diagnosis: Acute on CKD stage 4 Hyperkalemia Anemia of chronic disease Controlled DMII Dyslipidemia  Discharge Medications:   Medication List    STOP taking these medications       guaiFENesin 600 MG 12 hr tablet  Commonly known as:  MUCINEX     ibuprofen 200 MG tablet  Commonly known as:  ADVIL,MOTRIN     lisinopril 20 MG tablet  Commonly known as:  PRINIVIL,ZESTRIL     simvastatin 40 MG tablet  Commonly known as:  ZOCOR      TAKE these medications       amLODipine 10 MG tablet  Commonly known as:  NORVASC  Take 1 tablet (10 mg total) by mouth daily.     hydrochlorothiazide 25 MG tablet  Commonly known as:  HYDRODIURIL  Take 1 tablet (25 mg total) by mouth daily.     insulin NPH-regular Human (70-30) 100 UNIT/ML injection  Commonly known as:  NOVOLIN 70/30  Inject 10 Units into the skin 2 (two) times daily with a meal. Inject 10 units twice daily - breakfast and dinner.     pravastatin 40 MG tablet  Commonly known as:  PRAVACHOL  Take 1 tablet (40 mg total) by mouth daily.     TRIPLE ANTIBIOTIC EX  Apply 1 application topically 2 (two) times daily as needed (sore).        Disposition and follow-up:   [name] was [facility] in stable condition to home.   Please address the following problems post-discharge:  1.Hyperkalemia and Cr level, BP control (adjustment of meds to ensure at goal) 2.Compliance with no NSAIDS or any other nephrotoxins 3.Anemia  4.Callus on L BKA stump  5.Ensure follow-up with renal, Dr. Kathrene BongoGoldsborough gave card to arrange follow-up    Labs / imaging needed at time of follow-up: BMP, CK  Pending labs/ test needing follow-up:  SPEP, UPEP, PTH  Follow-up Appointments: Follow-up Information   Follow up with [facility]. (Internal Medicine Clinic will call you to schedule)    Contact information:   [address] [place] [address] [phone]      Schedule an appointment as soon as possible for a visit with Cecille AverGOLDSBOROUGH,KELLIE A, MD.   Specialty:  Nephrology   Contact information:   [address] [place] [address] [phone]       Discharge Instructions: Discharge Instructions   Call MD for:  difficulty breathing, headache or visual disturbances    Complete by:  As directed      Call MD for:  extreme fatigue    Complete by:  As directed      Call MD for:  persistant nausea and vomiting    Complete by:  As directed      Diet - low sodium heart healthy    Complete by:  As directed      Discharge instructions    Complete by:  As directed   Discontinue taking lisinopril.     Increase activity slowly    Complete by:  As directed            Consultations: Treatment Team:  Trevor IhaJames L Deterding, MD  Procedures Performed:  [facility]  08/20/2013   CLINICAL DATA:  43 year old male with renal  insufficiency. Diabetes and hypertension. Initial encounter.  EXAM: RENAL/URINARY TRACT ULTRASOUND COMPLETE  COMPARISON:  None.  FINDINGS: Right Kidney:  Length: 13.2 cm. Echogenicity within normal limits. No mass or hydronephrosis visualized.  Left Kidney:  Length: 13.3 cm. Echogenicity within normal limits. No mass or hydronephrosis visualized.  Bladder:  Diminutive, 61 mL volume.  Otherwise unremarkable.  IMPRESSION: Negative sonographic appearance of the kidneys and bladder.   Electronically Signed   By: Augusto Gamble M.D.   On: 08/20/2013 14:12    2D Echo: N/A  Cardiac Cath: N/A  Admission HPI:  Austin Mcdaniel is a 43 y.o. male who has a past medical history of controlled DMII diagnosed in his 64s (08/18/13 HA1c 5.9), CKD4, osteomyelitis of left foot s/p BKA, HTN, and dyslipidemia. He  presents to the [facility] with worsening creatinine and hyperkalemia. He denies any fever/chills, CP, SOB, orthopnea, PND, abdominal pain, N/V/D/C, urinary symptoms, or change in bowel habits. 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 Also endorses blurry vision in his left eye. No cardiac history. No h/o autoimmune disease. Denies any regular NSAID use but does report using ibuprofen occasionally. He has been eating and drinking normally. He drinks 3 beers/wk, smokes a cigar 1/wk, but denies any other recreational drug use.  Workup so far includes Cr: 3.63, K: 5.8, CO2: 19. EKG without acute changes.   Hospital Course by problem list: Principal Problem:   Hyperkalemia Active Problems:   DM type 2 causing vascular disease   HTN (hypertension)   HLD (hyperlipidemia)   Abnormal presence of protein in urine   Acute on Chronic Kidney Disease stage 4 Pt p/w elevated Cr (3.63) and hyperkalemia (5.8).  Baseline creatinine ~1.6 one year ago. He was given kayexlate with repeat potassium 5.2, Cr 3.62 upon discharge.  Renal consulted and ordered SPEP, UPEP, PTH which are pending. Dr. Kathrene Bongo (renal) recommended holding ACEi and discontinuing ibupfrofen and nephrotoxins.  It is unclear if his is AKI or actually a progression of his CKD and this is his new baseline.  He was given information on hyperkalemia and which foods to avoid.  Dr. Kathrene Bongo agreed with starting amlodipine for BP control.  Also felt anemia was out of proportion to his level of CKD and agreed with checking anemia panel and gave dose of Aranesp and assess for further need as an outpatient.  Likely d/t diabetic nephropathy and HTN.  However, he admits to taking ibuprofen "occasionally."  We held lisinopril and HCTZ during admission.  SPEP, UPEP neg. ANA neg (done at [facility]).  Pt was seen by [facility] nephrology in the past but did not want to travel that far and desires to establish care with a nephrologist in  [place]. He is hyperkalemic with elevated anion gap: 22, Mg:1.8, phos: 4.9. Urine microalb/Cr ratio: 1581.7.  Renal US neg.  CK level slightly elevated at 389.  BP 115/74 upon discharge.  Amlodipine (increased to 10mg  qd) and HCTZ were resumed upon discharge.  Stressed the importance of no NSAID use.  He will follow-up with renal post-discharge.    Anemia of chronic disease  Most likely d/t CKD. Hgb today 7.9.  Anemia panel wnl.    Hypertension  Stable; on home meds of HCTZ 25mg  qd, lisinopril 20mg  qd, and amlodipine 5mg  qd.  Increased amlodipine to 10mg  qd, continue HCTZ, hold lisinopril in the setting of possible AKI and hyperkalemia.  Follow-up with renal.   Controlled Diabetes Mellitus II  Stable.  Pt compliant with novolin 70/30 10 units twice daily with breakfast and dinner.    Dyslipidemia  Pt is on simvastatin 40mg  at home, d/c and replaced with pravastatin 40mg .   Discharge Vitals:   BP 145/75  Pulse 65  Temp(Src) 98.7 F (37.1 C) (Oral)  Resp 19  Ht 6\' 2"  (1.88 m)  Wt 267 lb 6.7 oz (121.3 kg)  BMI 34.32 kg/m2  SpO2 99%  Discharge Labs:  Results for orders placed during the hospital encounter of 08/19/13 (from the past 24 hour(s))  GLUCOSE, CAPILLARY     Status: Abnormal   Collection Time    08/19/13  2:55 PM      Result Value Ref Range   Glucose-Capillary 148 (*) 70 - 99 mg/dL   Comment 1 Notify RN    GLUCOSE, CAPILLARY     Status: Abnormal   Collection Time    08/19/13  4:35 PM      Result Value Ref Range   Glucose-Capillary 156 (*) 70 - 99 mg/dL   Comment 1 Notify RN    MAGNESIUM     Status: None   Collection Time    08/19/13  5:03 PM      Result Value Ref Range   Magnesium 1.8  1.5 - 2.5 mg/dL  PHOSPHORUS     Status: Abnormal   Collection Time    08/19/13  5:03 PM      Result Value Ref Range   Phosphorus 4.9 (*) 2.3 - 4.6 mg/dL  URINALYSIS, ROUTINE W REFLEX MICROSCOPIC     Status: Abnormal   Collection Time    08/19/13  9:00 PM      Result Value Ref  Range   Color, Urine YELLOW  YELLOW   APPearance CLEAR  CLEAR   Specific Gravity, Urine 1.013  1.005 - 1.030   pH 5.0  5.0 - 8.0   Glucose, UA 100 (*) NEGATIVE mg/dL   Hgb urine dipstick TRACE (*) NEGATIVE   Bilirubin Urine NEGATIVE  NEGATIVE   Ketones, ur NEGATIVE  NEGATIVE mg/dL   Protein, ur 161100 (*) NEGATIVE mg/dL   Urobilinogen, UA 0.2  0.0 - 1.0 mg/dL   Nitrite NEGATIVE  NEGATIVE   Leukocytes, UA NEGATIVE  NEGATIVE  URIC ACID     Status: Abnormal   Collection Time    08/19/13  9:00 PM      Result Value Ref Range   Uric Acid, Serum 8.8 (*) 4.0 - 7.8 mg/dL  URINE MICROSCOPIC-ADD ON     Status: Abnormal   Collection Time    08/19/13  9:00 PM      Result Value Ref Range   Squamous Epithelial / LPF RARE  RARE   WBC, UA 0-2  <3 WBC/hpf   RBC / HPF 0-2  <3 RBC/hpf   Casts HYALINE CASTS (*) NEGATIVE   Urine-Other MUCOUS PRESENT    GLUCOSE, CAPILLARY     Status: Abnormal   Collection Time    08/19/13 10:11 PM      Result Value Ref Range   Glucose-Capillary 139 (*) 70 - 99 mg/dL   Comment 1 Notify RN    BASIC METABOLIC PANEL     Status: Abnormal   Collection Time    08/20/13  3:40 AM      Result Value Ref Range   Sodium 139  137 - 147 mEq/L   Potassium 5.2  3.7 - 5.3 mEq/L   Chloride 106  96 - 112 mEq/L   CO2 20  19 - 32 mEq/L   Glucose, Bld 108 (*) 70 - 99 mg/dL   BUN 65 (*) 6 - 23 mg/dL   Creatinine, Ser 1.61 (*) 0.50 - 1.35 mg/dL   Calcium 8.8  8.4 - 09.6 mg/dL   GFR calc non Af Amer 19 (*) >90 mL/min   GFR calc Af Amer 22 (*) >90 mL/min  CBC WITH DIFFERENTIAL     Status: Abnormal   Collection Time    08/20/13  3:40 AM      Result Value Ref Range   WBC 7.4  4.0 - 10.5 K/uL   RBC 2.69 (*) 4.22 - 5.81 MIL/uL   Hemoglobin 7.9 (*) 13.0 - 17.0 g/dL   HCT 04.5 (*) 40.9 - 81.1 %   MCV 87.0  78.0 - 100.0 fL   MCH 29.4  26.0 - 34.0 pg   MCHC 33.8  30.0 - 36.0 g/dL   RDW 91.4  78.2 - 95.6 %   Platelets 249  150 - 400 K/uL   Neutrophils Relative % 51  43 - 77 %    Neutro Abs 3.8  1.7 - 7.7 K/uL   Lymphocytes Relative 31  12 - 46 %   Lymphs Abs 2.3  0.7 - 4.0 K/uL   Monocytes Relative 9  3 - 12 %   Monocytes Absolute 0.7  0.1 - 1.0 K/uL   Eosinophils Relative 8 (*) 0 - 5 %   Eosinophils Absolute 0.6  0.0 - 0.7 K/uL   Basophils Relative 1  0 - 1 %   Basophils Absolute 0.1  0.0 - 0.1 K/uL  GLUCOSE, CAPILLARY     Status: Abnormal   Collection Time    08/20/13  6:06 AM      Result Value Ref Range   Glucose-Capillary 107 (*) 70 - 99 mg/dL   Comment 1 Documented in Chart     Comment 2 Notify RN    RETICULOCYTES     Status: Abnormal   Collection Time    08/20/13  9:09 AM      Result Value Ref Range   Retic Ct Pct 1.7  0.4 - 3.1 %   RBC. 2.92 (*) 4.22 - 5.81 MIL/uL   Retic Count, Manual 49.6  19.0 - 186.0 K/uL  CK     Status: Abnormal   Collection Time    08/20/13  9:09 AM      Result Value Ref Range   Total CK 389 (*) 7 - 232 U/L  GLUCOSE, CAPILLARY     Status: Abnormal   Collection Time    08/20/13 11:06 AM      Result Value Ref Range   Glucose-Capillary 136 (*) 70 - 99 mg/dL   Comment 1 Notify RN      Signed: Marrian Salvage, MD 08/20/2013, 2:37 PM   Services Ordered on Discharge: None Equipment Ordered on Discharge: None

## 2013-08-20 NOTE — Progress Notes (Signed)
Subjective:   Day of hospitalization: 1  VSS.  No overnight events.  Pt is feeling good this AM without complaints.  No CP, SOB.  Had several BM with  kayexalate.  K:5.2 today.  Cr still elevated at 3.64.  I/Os: +52010ml yesterday, wt: 121.3kg  Objective:   Vital signs in last 24 hours: Filed Vitals:   08/19/13 1449 08/19/13 2208 08/20/13 0410  BP: 116/85 188/85 145/75  Pulse: 69 68 65  Temp: 97.5 F (36.4 C) 98.2 F (36.8 C) 98.7 F (37.1 C)  TempSrc: Oral Oral Oral  Resp:  18 19  Height: 6\' 2"  (1.88 m)    Weight: 267 lb 10.2 oz (121.4 kg)  267 lb 6.7 oz (121.3 kg)  SpO2: 99% 100% 99%    Weight: Filed Weights   08/19/13 1449 08/20/13 0410  Weight: 267 lb 10.2 oz (121.4 kg) 267 lb 6.7 oz (121.3 kg)    I/Os:  Intake/Output Summary (Last 24 hours) at 08/20/13 1214 Last data filed at 08/20/13 0300  Gross per 24 hour  Intake    960 ml  Output    450 ml  Net    510 ml    Physical Exam: Constitutional: Vital signs reviewed.  Patient is sitting up in bed in no acute distress and cooperative with exam.   HEENT: [place]/AT; PERRL, EOMI, conjunctivae pale, no scleral icterus  Cardiovascular: RRR, no MRG Pulmonary/Chest: normal respiratory effort, no accessory muscle use, CTAB, no wheezes, rales, or rhonchi Abdominal: Soft. +BS, NT/ND Neurological: A&O x3, CN II-XII grossly intact; non-focal exam Extremities: 2+DP RLE, no C/C, trace pitting edema RLE; LLE small healing callus on BKA stump Skin: Warm, dry and intact. No rash  Lab Results:  BMP:  Recent Labs Lab 08/19/13 1109 08/19/13 1703 08/20/13 0340  NA 138  --  139  K 5.8*  --  5.2  CL 104  --  106  CO2 19  --  20  GLUCOSE 120*  --  108*  BUN 67*  --  65*  CREATININE 3.63*  --  3.64*  CALCIUM 9.3  --  8.8  MG  --  1.8  --   PHOS  --  4.9*  --    Anion Gap:  13  CBC:  Recent Labs Lab 08/20/13 0340  WBC 7.4  NEUTROABS 3.8  HGB 7.9*  HCT 23.4*  MCV 87.0  PLT 249    Coagulation: No results  found for this basename: LABPROT, INR,  in the last 168 hours  CBG:            Recent Labs Lab 08/18/13 1103 08/19/13 1455 08/19/13 1635 08/19/13 2211 08/20/13 0606 08/20/13 1106  GLUCAP 131* 148* 156* 139* 107* 136*           HA1C:       Recent Labs Lab 08/18/13 1112  HGBA1C 5.9    Lipid Panel:  Recent Labs Lab 08/18/13 1153  CHOL 212*  HDL 31*  LDLCALC 154*  TRIG 133  CHOLHDL 6.8    LFTs: No results found for this basename: AST, ALT, ALKPHOS, BILITOT, PROT, ALBUMIN,  in the last 168 hours  Pancreatic Enzymes: No results found for this basename: LIPASE, AMYLASE,  in the last 168 hours  Lactic Acid/Procalcitonin: No results found for this basename: LATICACIDVEN, PROCALCITON,  in the last 168 hours  Ammonia: No results found for this basename: AMMONIA,  in the last 168 hours  Cardiac Enzymes:  Recent Labs Lab 08/20/13 [phone] [id number]  CKTOTAL 389*    BNP: No results found for this basename: PROBNP,  in the last 168 hours  D-Dimer: No results found for this basename: DDIMER,  in the last 168 hours  Urinalysis:  Recent Labs Lab 08/18/13 1201 08/19/13 2100  COLORURINE YELLOW YELLOW  LABSPEC 1.013 1.013  PHURINE 5.5 5.0  GLUCOSEU NEG 100*  HGBUR SMALL* TRACE*  BILIRUBINUR NEG NEGATIVE  KETONESUR NEG NEGATIVE  PROTEINUR 100* 100*  UROBILINOGEN 0.2 0.2  NITRITE NEG NEGATIVE  LEUKOCYTESUR NEG NEGATIVE    Medications:  Scheduled Meds: . amLODipine  5 mg Oral Daily  . heparin  5,000 Units Subcutaneous 3 times per day  . insulin aspart  0-9 Units Subcutaneous TID WC  . simvastatin  40 mg Oral Q supper   Continuous Infusions:  PRN Meds:   Antibiotics: Antibiotics Given (last 72 hours)   None      Day of Hospitalization: 1  Consults: Treatment Team:  Trevor IhaJames L Deterding, MD  Assessment/Plan:   Principal Problem:   Hyperkalemia Active Problems:   DM type 2 causing vascular disease   HTN (hypertension)   HLD (hyperlipidemia)   Abnormal  presence of protein in urine  Acute on Chronic Kidney Disease  Baseline creatinine ~1.6 one year ago. Likely d/t diabetic nephropathy. SPEP, UPEP neg. ANA neg. Pt [facility] nephrology in the past but did not want to travel that far and desires to establish care with a nephrologist in [place]. He is hyperkalemic with elevated anion gap: 22, Mg:1.8, phos: 4.9. Urine microalb/Cr ratio: 1581.7.  -renal US completed, pending report  -strict I/O  -BMP to monitor electolytes  -consulted renal, appreciate recs  -hold ACEi and HCTZ  -CK level  -avoid nephrotoxic agents   Anemia of chronic disease  Most likely d/t CKD. Hgb today 7.9.  -anemia panel   Hypertension  Stable; on home meds of HCTZ 25mg  qd, lisinopril 20mg  qd, and amlodipine 5mg  qd.  -continue amlodipine 5mg , hold HCTZ, lisinopril in the setting of CKD  Controlled Diabetes Mellitus II  Pt noncompliant with novolin 70/30 10 units twice daily with breakfast and dinner.  -SSI-s  -ac and hs cbg   Recent Labs  08/19/13 2211 08/20/13 0606 08/20/13 1106  GLUCAP 139* 107* 136*   Dyslipidemia  Pt is on simvastatin 40mg  at home.  Lab Results   Component  Value  Date    LDLCALC  154*  08/18/2013   -continue alternative statin    FEN  Fluids- none  Electrolytes-   Hyperkalemia, resolved - kayexalate x 2 doses, K: 5.2 today.  Nutrition- renal   VTE prophylaxis  5000 Units Heparin SQ tid  Disposition Disposition is deferred, awaiting improvement of current medical problems.  Anticipated discharge in approximately 1-2 day(s).     LOS: 1 day   Marrian SalvageJacquelyn S Gill, MD PGY-1, Internal Medicine Teaching Service [phone] (7AM-5PM Mon-Fri) 08/20/2013, 12:14 PM

## 2013-08-22 ENCOUNTER — Ambulatory Visit (INDEPENDENT_AMBULATORY_CARE_PROVIDER_SITE_OTHER): Payer: 59 | Admitting: Internal Medicine

## 2013-08-22 ENCOUNTER — Encounter: Payer: Self-pay | Admitting: Internal Medicine

## 2013-08-22 ENCOUNTER — Telehealth: Payer: Self-pay | Admitting: *Deleted

## 2013-08-22 VITALS — BP 168/87 | HR 78 | Temp 96.4°F | Ht 73.0 in | Wt 282.8 lb

## 2013-08-22 DIAGNOSIS — H539 Unspecified visual disturbance: Secondary | ICD-10-CM | POA: Insufficient documentation

## 2013-08-22 DIAGNOSIS — I10 Essential (primary) hypertension: Secondary | ICD-10-CM

## 2013-08-22 DIAGNOSIS — I129 Hypertensive chronic kidney disease with stage 1 through stage 4 chronic kidney disease, or unspecified chronic kidney disease: Secondary | ICD-10-CM

## 2013-08-22 LAB — PARATHYROID HORMONE, INTACT (NO CA): PTH: 235.1 pg/mL — ABNORMAL HIGH (ref 14.0–72.0)

## 2013-08-22 NOTE — Telephone Encounter (Signed)
Pt walked in to clinic and  States since 08/21/13 - periods of blurred vision. CBG this AM 145. Appt made 08/22/13 2:30PM Dr Sherrine MaplesGlenn. Stanton KidneyDebra Craigory Toste RN 08/22/13 1:20PM

## 2013-08-22 NOTE — Assessment & Plan Note (Signed)
Pt with acute onset blurry vision, L>R,denies pain. Describes vision as though he is "looking through a steam shower at someone." BP somewhat elevated today. Per pt, CBGs controlled. He denies any trauma. On exam, PERRL and EOMI. Unable to visualize optic discs. Given the acuity of his symptoms, and his h/o uncontrolled HTN and DM2 (which has been controlled lately), I have called Dr. Dione BoozeGroat with Opthalmology for an Urgent referral. He is to see the patient today after this appointment.

## 2013-08-22 NOTE — Patient Instructions (Signed)
**  Return to the clinic tomorrow or Wed. for blood pressure recheck.     General Instructions:   Please bring your medicines with you each time you come to clinic.  Medicines may include prescription medications, over-the-counter medications, herbal remedies, eye drops, vitamins, or other pills.   Progress Toward Treatment Goals:  Treatment Goal 08/18/2013  Hemoglobin A1C at goal  Blood pressure deteriorated  Stop smoking (No Data)    Self Care Goals & Plans:  Self Care Goal 08/18/2013  Manage my medications take my medicines as prescribed; bring my medications to every visit; refill my medications on time  Monitor my health check my feet daily; keep track of my weight  Eat healthy foods eat baked foods instead of fried foods; eat foods that are low in salt  Be physically active find an activity I enjoy  Stop smoking go to the [employer] website ([email or web address].eewww.quitlinenc.com)  Meeting treatment goals -    Home Blood Glucose Monitoring 08/18/2013  Check my blood sugar once a day  When to check my blood sugar before breakfast     Care Management & Community Referrals:  Referral 10/05/2012  Referrals made for care management support none needed  Referrals made to community resources none

## 2013-08-22 NOTE — Progress Notes (Signed)
Patient ID: Austin Mcdaniel Cona, male   DOB: Mar 16, 1970, 43 y.o.   MRN: [id number]  Subjective:   Patient ID: Austin Mcdaniel Pringle male   DOB: Mar 16, 1970 43 y.o.   MRN: [id number]  HPI: [name] is a 43 y.o. M w/ PMH DM2, L BKA, HTN, HLD, and CKD 3 presents with acute onset blurry vision.  He was discharged from the hospital on 6/27 after being admitted with possible AKI in the setting of CKD and hyperkalemia. After d/c, the patient states that his vision seemed fine, but on Sunday he felt that his vision was a blurry in both eyes, L>R. He denies any eye pain. He was concerned that it was related to his blood pressure and went to [employer] to have it checked in an automated machine, and it was normal. His CBGs have been controlled and around 130s. He denies any recent trauma. He states that his medications were changed in the hospital and he was started on Amlodipine 10mg  daily in addition to his HCTZ. His lisinopril was held due to the hyperkalemia. He is concerned that the vision is related to the Amlodipine. He states that his vision is improved with sunglasses and when the lights are off but seems the worse while sitting at a computer screen.    Past Medical History  Diagnosis Date  . Acid reflux   . Diabetic foot ulcer 06/2011; 10/2011-12/2011    left dorsal foot  (01/03/2012)  . Osteomyelitis of left foot   . Type II diabetes mellitus   . Hypertension   . Chronic kidney disease     CKD STAGE 3   Current Outpatient Prescriptions  Medication Sig Dispense Refill  . amLODipine (NORVASC) 10 MG tablet Take 1 tablet (10 mg total) by mouth daily.  30 tablet  1  . hydrochlorothiazide (HYDRODIURIL) 25 MG tablet Take 1 tablet (25 mg total) by mouth daily.  30 tablet  1  . insulin NPH-regular Human (NOVOLIN 70/30) (70-30) 100 UNIT/ML injection Inject 10 Units into the skin 2 (two) times daily with a meal. Inject 10 units twice daily - breakfast and dinner.  10 mL  11  . Neomycin-Bacitracin-Polymyxin (TRIPLE  ANTIBIOTIC EX) Apply 1 application topically 2 (two) times daily as needed (sore).       . pravastatin (PRAVACHOL) 40 MG tablet Take 1 tablet (40 mg total) by mouth daily.  30 tablet  0   No current facility-administered medications for this visit.   No family history on file. History   Social History  . Marital Status: Legally Separated    Spouse Name: N/A    Number of Children: N/A  . Years of Education: N/A   Social History Main Topics  . Smoking status: Current Some Day Smoker -- 0.10 packs/day for 7 years    Types: Cigars  . Smokeless tobacco: Current User    Types: Snuff     Comment: 01/03/2012 "quit cigarettes 15-20 yr ago; occasionally smoke a pipe; dip rarely - last time ~ 3 months ago"  07/2013 "occasional cigar"  . Alcohol Use: 1.2 oz/week    2 Cans of beer per week     Comment: 01/03/2012 "couple beers once/wk"  . Drug Use: No  . Sexual Activity: Yes   Other Topics Concern  . None   Social History Narrative  . None   Review of Systems: Constitutional: Denies fever, chills, or fatigue.  HEENT: Denies photophobia. +blurring of vision, L>R Respiratory: Denies SOB Cardiovascular: Denies chest pain  Gastrointestinal: Denies  nausea, vomiting, abdominal pain Genitourinary: Denies dysuria, urgency, frequency, hematuria, Musculoskeletal: Denies myalgias, joint swelling.   Skin: Denies rash and wound.  Neurological: Denies dizziness or weakness or headaches.  Psychiatric/Behavioral: Denies suicidal ideation, mood changes  Objective:  Physical Exam: Filed Vitals:   08/22/13 1455  BP: 177/84  Pulse: 79  Temp: 96.4 F (35.8 C)  TempSrc: Oral  Height: 6\' 1"  (1.854 m)  Weight: 282 lb 12.8 oz (128.277 kg)  SpO2: 100%   Constitutional: Vital signs reviewed.  Patient is a well-developed and well-nourished male in no acute distress and cooperative with exam. Head: Normocephalic and atraumatic Eyes: PERRL, EOMI, conjunctivae normal, no scleral icterus. Unable to  visualize optic discs Neck: Supple, trachea midline, normal ROM Cardiovascular: RRR, no MRG Pulmonary/Chest: normal respiratory effort, CTAB, no wheezes, rales, or rhonchi Abdominal: Soft. Non-tender, non-distended, bowel sounds are normal Musculoskeletal: L BKA, moves all extremities Hematology: No cervical adenopathy.  Neurological: A&O x3, cranial nerve II-XII are grossly intact, no focal deficit Skin: Warm, dry and intact. No rash, +large ecchymoses on RUE from IV site  Psychiatric: Normal mood and affect. Speech and behavior is normal.   Assessment & Plan:   Please refer to Problem List based Assessment and Plan

## 2013-08-22 NOTE — Assessment & Plan Note (Addendum)
BP Readings from Last 3 Encounters:  08/22/13 168/87  08/20/13 115/74  08/18/13 189/102    Lab Results  Component Value Date   NA 136* 08/20/2013   K 5.2 08/20/2013   CREATININE 3.62* 08/20/2013    Assessment: BP improved but still elevated.   Plan: Medications:  continue current medications Educational resources provided:   Self management tools provided:   Other plans: Will likely need to add additional agent, but would like for him to have his eyes checked, as with his concern over the vision changes is likely contributing to his hypertension.  - Return to the clinic on Tues/Wed for BP recheck and to check BMP and CK for hospital f/u

## 2013-08-22 NOTE — Progress Notes (Signed)
I saw and evaluated the patient.  I personally confirmed the key portions of the history and exam documented by Dr. [name] and I reviewed pertinent patient test results.  The assessment, diagnosis, and plan were formulated together and I agree with the documentation in the resident's note. 

## 2013-08-23 LAB — PROTEIN ELECTROPHORESIS, SERUM
ALPHA-1-GLOBULIN: 4.5 % (ref 2.9–4.9)
ALPHA-2-GLOBULIN: 11.4 % (ref 7.1–11.8)
Albumin ELP: 54.2 % — ABNORMAL LOW (ref 55.8–66.1)
BETA 2: 5 % (ref 3.2–6.5)
Beta Globulin: 6.7 % (ref 4.7–7.2)
Gamma Globulin: 18.2 % (ref 11.1–18.8)
M-Spike, %: NOT DETECTED g/dL
Total Protein ELP: 6.5 g/dL (ref 6.0–8.3)

## 2013-08-24 ENCOUNTER — Encounter (INDEPENDENT_AMBULATORY_CARE_PROVIDER_SITE_OTHER): Payer: 59 | Admitting: Ophthalmology

## 2013-08-24 DIAGNOSIS — H43819 Vitreous degeneration, unspecified eye: Secondary | ICD-10-CM

## 2013-08-24 DIAGNOSIS — H4310 Vitreous hemorrhage, unspecified eye: Secondary | ICD-10-CM

## 2013-08-24 DIAGNOSIS — E1165 Type 2 diabetes mellitus with hyperglycemia: Secondary | ICD-10-CM

## 2013-08-24 DIAGNOSIS — H2510 Age-related nuclear cataract, unspecified eye: Secondary | ICD-10-CM

## 2013-08-24 DIAGNOSIS — E11359 Type 2 diabetes mellitus with proliferative diabetic retinopathy without macular edema: Secondary | ICD-10-CM

## 2013-08-24 DIAGNOSIS — E1139 Type 2 diabetes mellitus with other diabetic ophthalmic complication: Secondary | ICD-10-CM

## 2013-08-24 DIAGNOSIS — I10 Essential (primary) hypertension: Secondary | ICD-10-CM

## 2013-08-24 DIAGNOSIS — H35039 Hypertensive retinopathy, unspecified eye: Secondary | ICD-10-CM

## 2013-09-02 ENCOUNTER — Other Ambulatory Visit (INDEPENDENT_AMBULATORY_CARE_PROVIDER_SITE_OTHER): Payer: 59 | Admitting: Ophthalmology

## 2013-09-02 DIAGNOSIS — E1165 Type 2 diabetes mellitus with hyperglycemia: Secondary | ICD-10-CM

## 2013-09-02 DIAGNOSIS — E11359 Type 2 diabetes mellitus with proliferative diabetic retinopathy without macular edema: Secondary | ICD-10-CM

## 2013-09-02 DIAGNOSIS — E1139 Type 2 diabetes mellitus with other diabetic ophthalmic complication: Secondary | ICD-10-CM

## 2013-09-05 ENCOUNTER — Telehealth: Payer: Self-pay | Admitting: *Deleted

## 2013-09-05 NOTE — Telephone Encounter (Signed)
Pt called clinic BP med recently new - swelling right leg and both hands. BP this AM 133/96. Problems wearing shoe on right foot. Pt did not take BP med  - swelling sl better. Offered appt today - pt prefers to come Friday - made 09/09/13 9:15AM Dr Shirlee LatchMcLean. Pt will call is swelling is worse.Stanton KidneyDebra Jozey Janco RN 09/05/13 3PM

## 2013-09-09 ENCOUNTER — Ambulatory Visit (INDEPENDENT_AMBULATORY_CARE_PROVIDER_SITE_OTHER): Payer: 59 | Admitting: Internal Medicine

## 2013-09-09 ENCOUNTER — Encounter: Payer: Self-pay | Admitting: Internal Medicine

## 2013-09-09 VITALS — BP 199/97 | HR 72 | Temp 97.5°F | Ht 73.0 in | Wt 283.6 lb

## 2013-09-09 DIAGNOSIS — N179 Acute kidney failure, unspecified: Secondary | ICD-10-CM

## 2013-09-09 DIAGNOSIS — I10 Essential (primary) hypertension: Secondary | ICD-10-CM

## 2013-09-09 DIAGNOSIS — N184 Chronic kidney disease, stage 4 (severe): Secondary | ICD-10-CM

## 2013-09-09 DIAGNOSIS — I129 Hypertensive chronic kidney disease with stage 1 through stage 4 chronic kidney disease, or unspecified chronic kidney disease: Secondary | ICD-10-CM

## 2013-09-09 LAB — BASIC METABOLIC PANEL WITH GFR
BUN: 52 mg/dL — AB (ref 6–23)
CHLORIDE: 105 meq/L (ref 96–112)
CO2: 22 meq/L (ref 19–32)
CREATININE: 3.84 mg/dL — AB (ref 0.50–1.35)
Calcium: 8.6 mg/dL (ref 8.4–10.5)
GFR, Est African American: 21 mL/min — ABNORMAL LOW
GFR, Est Non African American: 18 mL/min — ABNORMAL LOW
Glucose, Bld: 123 mg/dL — ABNORMAL HIGH (ref 70–99)
Potassium: 4.8 mEq/L (ref 3.5–5.3)
Sodium: 136 mEq/L (ref 135–145)

## 2013-09-09 LAB — CK: CK TOTAL: 287 U/L — AB (ref 7–232)

## 2013-09-09 MED ORDER — LABETALOL HCL 100 MG PO TABS: 100.0000 mg | ORAL_TABLET | Freq: Two times a day (BID) | ORAL | Status: DC

## 2013-09-09 NOTE — Patient Instructions (Addendum)
General Instructions: Please follow up with the kidney doctor  I will let you know about your labs  Start labetalol 100 mg bid for blood pressure  Keep track of your blood pressure  Follow up in 2-4 weeks for blood pressure   Treatment Goals:  Goals (1 Years of Data) as of 09/09/13         As of Today 08/22/13 08/22/13 08/20/13 08/20/13     Blood Pressure    . Blood Pressure < 140/90  199/97 168/87 177/84 115/74 145/75     Lifestyle    . Prevent Falls           Result Component    . HEMOGLOBIN A1C < 7.0          . LDL CALC < 100            Progress Toward Treatment Goals:  Treatment Goal 09/09/2013  Hemoglobin A1C at goal  Blood pressure deteriorated  Stop smoking -    Self Care Goals & Plans:  Self Care Goal 09/09/2013  Manage my medications take my medicines as prescribed; bring my medications to every visit; refill my medications on time; follow the sick day instructions if I am sick  Monitor my health keep track of my blood glucose; keep track of my weight; check my feet daily  Eat healthy foods eat more vegetables; eat fruit for snacks and desserts; eat baked foods instead of fried foods; eat foods that are low in salt; eat smaller portions; drink diet soda or water instead of juice or soda  Be physically active find an activity I enjoy  Stop smoking -  Meeting treatment goals maintain the current self-care plan    Home Blood Glucose Monitoring 09/09/2013  Check my blood sugar 3 times a day  When to check my blood sugar before meals     Care Management & Community Referrals:  Referral 09/09/2013  Referrals made for care management support -  Referrals made to community resources none       Labetalol tablets What is this medicine? LABETALOL (la BET a lole) is a beta-blocker. Beta-blockers reduce the workload on the heart and help it to beat more regularly. This medicine is used to treat high blood pressure. This medicine may be used for other purposes; ask your  health care provider or pharmacist if you have questions. COMMON BRAND NAME(S): Normodyne, Trandate What should I tell my health care provider before I take this medicine? They need to know if you have any of these conditions: -diabetes -history of heart attack, heart disease or heart failure -kidney disease -liver disease -lung or breathing disease, like asthma or emphysema -pheochromocytoma -thyroid disease -an unusual or allergic reaction to labetalol, other beta-blockers, medicines, foods, dyes, or preservatives -pregnant or trying to get pregnant -breast-feeding How should I use this medicine? Take this medicine by mouth with a glass of water. Follow the directions on the prescription label. Take your doses at regular intervals. Do not take your medicine more often than directed. Do not stop taking this medicine suddenly. This could lead to serious heart-related effects. Talk to your pediatrician regarding the use of this medicine in children. Special care may be needed. Overdosage: If you think you have taken too much of this medicine contact a poison control center or emergency room at once. NOTE: This medicine is only for you. Do not share this medicine with others. What if I miss a dose? If you miss a dose, take it  as soon as you can. If it is almost time for your next dose, take only that dose. Do not take double or extra doses. What may interact with this medicine? This medicine also interact with the following medications: -certain medicines for blood pressure, heart disease, irregular heart beat -cimetidine -general anesthetics -medicines for asthma or lung disease like albuterol -medicines for depression -nitroglycerin This list may not describe all possible interactions. Give your health care provider a list of all the medicines, herbs, non-prescription drugs, or dietary supplements you use. Also tell them if you smoke, drink alcohol, or use illegal drugs. Some items may  interact with your medicine. What should I watch for while using this medicine? Visit your doctor or health care professional for regular check ups. Check your blood pressure and pulse rate regularly. Ask your health care professional what your blood pressure and pulse rate should be, and when you should contact him or her. You may get drowsy or dizzy. Do not drive, use machinery, or do anything that needs mental alertness until you know how this medicine affects you. Do not stand or sit up quickly. Alcohol may interfere with the effect of this medicine. Avoid alcoholic drinks. This medicine can affect blood sugar levels. If you have diabetes, check with your doctor or health care professional before you change your diet or the dose of your diabetic medicine. Do not treat yourself for coughs, colds, or pain while you are taking this medicine without asking your doctor or health care professional for advice. Some ingredients may increase your blood pressure. What side effects may I notice from receiving this medicine? Side effects that you should report to your doctor or health care professional as soon as possible: -allergic reactions like skin rash, itching or hives, swelling of the face, lips, or tongue -breathing problems -cold hands or feet -dark urine -depression -general ill feeling or flu-like symptoms -irregular heartbeat -light-colored stools -loss of appetite, nausea -pain or trouble passing urine -right upper belly pain -slow heart rate (fewer than recommended by your doctor or health care professional) -swollen legs or ankles -tingling of the scalp or skin -unusually weak or tired -vomiting -yellowing of the eyes or skin Side effects that usually do not require medical attention (report to your doctor or health care professional if they continue or are bothersome): -decreased sexual function or desire -dry itching skin -headache -tiredness This list may not describe all  possible side effects. Call your doctor for medical advice about side effects. You may report side effects to FDA at 1-800-FDA-1088. Where should I keep my medicine? Keep out of the reach of children. Store at room temperature between 15 and 30 degrees C (59 and 86 degrees F). Protect from light. Keep container tightly closed. Throw away any unused medicine after the expiration date. NOTE: This sheet is a summary. It may not cover all possible information. If you have questions about this medicine, talk to your doctor, pharmacist, or health care provider.  2015, Elsevier/Gold Standard. (2012-10-15 14:34:23)  Hypertension Hypertension, commonly called high blood pressure, is when the force of blood pumping through your arteries is too strong. Your arteries are the blood vessels that carry blood from your heart throughout your body. A blood pressure reading consists of a higher number over a lower number, such as 110/72. The higher number (systolic) is the pressure inside your arteries when your heart pumps. The lower number (diastolic) is the pressure inside your arteries when your heart relaxes. Ideally you want  your blood pressure below 120/80. Hypertension forces your heart to work harder to pump blood. Your arteries may become narrow or stiff. Having hypertension puts you at risk for heart disease, stroke, and other problems.  RISK FACTORS Some risk factors for high blood pressure are controllable. Others are not.  Risk factors you cannot control include:   Race. You may be at higher risk if you are African American.  Age. Risk increases with age.  Gender. Men are at higher risk than women before age 14 years. After age 78, women are at higher risk than men. Risk factors you can control include:  Not getting enough exercise or physical activity.  Being overweight.  Getting too much fat, sugar, calories, or salt in your diet.  Drinking too much alcohol. SIGNS AND SYMPTOMS Hypertension  does not usually cause signs or symptoms. Extremely high blood pressure (hypertensive crisis) may cause headache, anxiety, shortness of breath, and nosebleed. DIAGNOSIS  To check if you have hypertension, your health care provider will measure your blood pressure while you are seated, with your arm held at the level of your heart. It should be measured at least twice using the same arm. Certain conditions can cause a difference in blood pressure between your right and left arms. A blood pressure reading that is higher than normal on one occasion does not mean that you need treatment. If one blood pressure reading is high, ask your health care provider about having it checked again. TREATMENT  Treating high blood pressure includes making lifestyle changes and possibly taking medication. Living a healthy lifestyle can help lower high blood pressure. You may need to change some of your habits. Lifestyle changes may include:  Following the DASH diet. This diet is high in fruits, vegetables, and whole grains. It is low in salt, red meat, and added sugars.  Getting at least 2 1/2 hours of brisk physical activity every week.  Losing weight if necessary.  Not smoking.  Limiting alcoholic beverages.  Learning ways to reduce stress. If lifestyle changes are not enough to get your blood pressure under control, your health care provider may prescribe medicine. You may need to take more than one. Work closely with your health care provider to understand the risks and benefits. HOME CARE INSTRUCTIONS  Have your blood pressure rechecked as directed by your health care provider.   Only take medicine as directed by your health care provider. Follow the directions carefully. Blood pressure medicines must be taken as prescribed. The medicine does not work as well when you skip doses. Skipping doses also puts you at risk for problems.   Do not smoke.   Monitor your blood pressure at home as directed by  your health care provider. SEEK MEDICAL CARE IF:   You think you are having a reaction to medicines taken.  You have recurrent headaches or feel dizzy.  You have swelling in your ankles.  You have trouble with your vision. SEEK IMMEDIATE MEDICAL CARE IF:  You develop a severe headache or confusion.  You have unusual weakness, numbness, or feel faint.  You have severe chest or abdominal pain.  You vomit repeatedly.  You have trouble breathing. MAKE SURE YOU:   Understand these instructions.  Will watch your condition.  Will get help right away if you are not doing well or get worse. Document Released: 02/10/2005 Document Revised: 02/15/2013 Document Reviewed: 12/03/2012 [facility] Patient Information 2015 [place], [place]. This information is not intended to replace advice given to you  by your health care provider. Make sure you discuss any questions you have with your health care provider.

## 2013-09-09 NOTE — Progress Notes (Signed)
   Subjective:    Patient ID: Austin Mcdaniel, Austin Mcdaniel    DOB: January 15, 1971, 43 y.o.   MRN: [id number]  HPI Comments: 43 y.o DM 2 (HA1C 5.9 07/2013, cbg 109 today), HTN, HLD, tobacco abuse, CKD  He presents for f/u  1. AKI on CKD 4.  Recently in hospital end of June 2015 with Creatinine 3.37-3.62 on d/c prior to that Cr 06/2012 was 1.92-2.  Will repeat BMET, CK total today as instructed on d/c summ.  Patient is supposed to f/u with renal but no appt sch as of yet. Recent renal US neg, negative M spike. PTH elevated with elevated phos 5.2.  He denies difficulty urinating and is urinating 6-7 times per day  2. Uncontrolled BP today 199/97. Denies chest pain, sob, h/a. Off ACEI due to AKI.  Only taking HCTZ 25 mg qd and stopped taking Norvasc 10 mg Monday due to right leg swelling.  He states Norvasc actually controlled BP but he had leg edema which he called "cankle"  Swelling is better off Norvasc and he has more energy 3. Recently came to clinic 6/29 with vision changes and was supposed to f/u with Dr. Dione BoozeGroat.  He did Dr. Dione BoozeGroat did laser surgery and was not sure if he had eye damage from DM vs Blood vessel damage per patient.     SH: engaged saving $ to possibly get [place] with in laws [place] living [place].    ROS per above     Review of Systems  Cardiovascular:       Improved lower leg swelling   Gastrointestinal: Negative for constipation.       Objective:   Physical Exam  Nursing note and vitals reviewed. Constitutional: He is oriented to person, place, and time. He appears well-developed and well-nourished. He is cooperative.  HENT:  Head: Normocephalic and atraumatic.  Mouth/Throat: No oropharyngeal exudate.  Eyes: Conjunctivae are normal. Pupils are equal, round, and reactive to light. Right eye exhibits no discharge. Left eye exhibits no discharge. No scleral icterus.  Cardiovascular: Normal rate, regular rhythm, S1 normal, S2 normal and normal heart sounds.   No murmur  heard. 1+ edema right lower extremity  Pulmonary/Chest: Effort normal and breath sounds normal.  Abdominal: Soft. Bowel sounds are normal. He exhibits no distension. There is no tenderness.  Musculoskeletal: He exhibits no edema.  Neurological: He is alert and oriented to person, place, and time. Gait normal.  Left prosthetic leg  Skin: Skin is warm, dry and intact. No rash noted.  Psychiatric: He has a normal mood and affect. His speech is normal and behavior is normal. Judgment and thought content normal. Cognition and memory are normal.          Assessment & Plan:  F/u in 2-4 weeks HTN and chronic medication issues

## 2013-09-09 NOTE — Assessment & Plan Note (Addendum)
Recent admission for A/C CKD.  Unclear if baseline Creatinine around 2 progressively getting worse W/u with renal US neg, M spike neg.  He did have elevated PTH and phos He is to f/u with renal outpatient-strongly encouraged today  Will repeat BMET, CK total today as indicated in hospital discharge summary to monitor K, Cr, and CK total which was elevated possibly 2/2 renal failure

## 2013-09-09 NOTE — Assessment & Plan Note (Addendum)
BP Readings from Last 3 Encounters:  09/09/13 199/97  08/22/13 168/87  08/20/13 115/74    Lab Results  Component Value Date   NA 136* 08/20/2013   K 5.2 08/20/2013   CREATININE 3.62* 08/20/2013    Assessment: Blood pressure control: moderately elevated Progress toward BP goal:  deteriorated Comments: BP uncontrolled since stopping Norvasc 10 mg due to leg swelling   Plan: Medications:  continue current medications (HCTZ 25 mg qd), added Labetalol 100 mg bid. Will avoid Norvasc in the future due to leg swelling  Educational resources provided: brochure;handout;video Self management tools provided: yes Other plans: BMET today, f/u in 2-4 weeks HTN

## 2013-09-10 ENCOUNTER — Encounter: Payer: Self-pay | Admitting: Internal Medicine

## 2013-09-13 NOTE — Progress Notes (Signed)
Case discussed with Dr. McLean at the time of the visit.  We reviewed the resident's history and exam and pertinent patient test results.  I agree with the assessment, diagnosis, and plan of care documented in the resident's note.     

## 2013-10-06 ENCOUNTER — Encounter: Payer: Self-pay | Admitting: Internal Medicine

## 2013-10-06 ENCOUNTER — Encounter: Payer: 59 | Admitting: Internal Medicine

## 2013-10-14 ENCOUNTER — Other Ambulatory Visit (HOSPITAL_COMMUNITY): Payer: Self-pay

## 2013-10-17 ENCOUNTER — Ambulatory Visit (HOSPITAL_COMMUNITY)
Admission: RE | Admit: 2013-10-17 | Discharge: 2013-10-17 | Disposition: A | Discharge status: Home / Self Care | Payer: 59 | Source: Ambulatory Visit | Attending: Nephrology | Admitting: Nephrology

## 2013-10-17 DIAGNOSIS — D649 Anemia, unspecified: Secondary | ICD-10-CM | POA: Diagnosis not present

## 2013-10-17 LAB — RENAL FUNCTION PANEL
ANION GAP: 14 (ref 5–15)
Albumin: 3.4 g/dL — ABNORMAL LOW (ref 3.5–5.2)
BUN: 66 mg/dL — ABNORMAL HIGH (ref 6–23)
CHLORIDE: 105 meq/L (ref 96–112)
CO2: 20 mEq/L (ref 19–32)
CREATININE: 4.31 mg/dL — AB (ref 0.50–1.35)
Calcium: 9.2 mg/dL (ref 8.4–10.5)
GFR calc Af Amer: 18 mL/min — ABNORMAL LOW (ref >90)
GFR, EST NON AFRICAN AMERICAN: 15 mL/min — AB (ref >90)
Glucose, Bld: 112 mg/dL — ABNORMAL HIGH (ref 70–99)
POTASSIUM: 5 meq/L (ref 3.7–5.3)
Phosphorus: 4.6 mg/dL (ref 2.3–4.6)
Sodium: 139 mEq/L (ref 137–147)

## 2013-10-17 LAB — IRON AND TIBC
IRON: 89 ug/dL (ref 42–135)
Saturation Ratios: 31 % (ref 20–55)
TIBC: 287 ug/dL (ref 215–435)
UIBC: 198 ug/dL (ref 125–400)

## 2013-10-17 LAB — POCT HEMOGLOBIN-HEMACUE: Hemoglobin: 9 g/dL — ABNORMAL LOW (ref 13.0–17.0)

## 2013-10-17 LAB — FERRITIN: FERRITIN: 198 ng/mL (ref 22–322)

## 2013-10-17 MED ORDER — EPOETIN ALFA 20000 UNIT/ML IJ SOLN
20000.0000 [IU] | INTRAMUSCULAR | Status: DC
  Administered 2013-10-17: 20000 [IU] via SUBCUTANEOUS

## 2013-10-17 MED ORDER — EPOETIN ALFA 20000 UNIT/ML IJ SOLN
INTRAMUSCULAR | Status: AC
  Filled 2013-10-17: qty 1

## 2013-10-17 NOTE — Discharge Instructions (Signed)

## 2013-10-19 LAB — PTH, INTACT AND CALCIUM
CALCIUM TOTAL (PTH): 9.1 mg/dL (ref 8.4–10.5)
PTH: 137 pg/mL — ABNORMAL HIGH (ref 14–64)

## 2013-10-28 ENCOUNTER — Encounter (HOSPITAL_COMMUNITY): Payer: 59

## 2013-11-02 NOTE — Addendum Note (Signed)
Addended by: Bufford Spikes on: 11/02/2013 04:19 PM   Modules accepted: Orders

## 2013-11-04 ENCOUNTER — Encounter (HOSPITAL_COMMUNITY)
Admission: RE | Admit: 2013-11-04 | Discharge: 2013-11-04 | Disposition: A | Discharge status: Home / Self Care | Payer: 59 | Source: Ambulatory Visit | Attending: Nephrology | Admitting: Nephrology

## 2013-11-04 DIAGNOSIS — N039 Chronic nephritic syndrome with unspecified morphologic changes: Principal | ICD-10-CM

## 2013-11-04 DIAGNOSIS — I129 Hypertensive chronic kidney disease with stage 1 through stage 4 chronic kidney disease, or unspecified chronic kidney disease: Secondary | ICD-10-CM | POA: Insufficient documentation

## 2013-11-04 DIAGNOSIS — N184 Chronic kidney disease, stage 4 (severe): Secondary | ICD-10-CM | POA: Diagnosis present

## 2013-11-04 DIAGNOSIS — D631 Anemia in chronic kidney disease: Secondary | ICD-10-CM | POA: Insufficient documentation

## 2013-11-04 LAB — POCT HEMOGLOBIN-HEMACUE: HEMOGLOBIN: 10.4 g/dL — AB (ref 13.0–17.0)

## 2013-11-04 MED ORDER — EPOETIN ALFA 20000 UNIT/ML IJ SOLN
INTRAMUSCULAR | Status: AC
  Filled 2013-11-04: qty 1

## 2013-11-04 MED ORDER — EPOETIN ALFA 20000 UNIT/ML IJ SOLN
20000.0000 [IU] | INTRAMUSCULAR | Status: DC
  Administered 2013-11-04: 20000 [IU] via SUBCUTANEOUS

## 2013-11-18 ENCOUNTER — Encounter (HOSPITAL_COMMUNITY)
Admission: RE | Admit: 2013-11-18 | Discharge: 2013-11-18 | Disposition: A | Discharge status: Home / Self Care | Payer: 59 | Source: Ambulatory Visit | Attending: Nephrology | Admitting: Nephrology

## 2013-11-18 ENCOUNTER — Encounter (HOSPITAL_COMMUNITY): Payer: 59

## 2013-11-18 DIAGNOSIS — D631 Anemia in chronic kidney disease: Secondary | ICD-10-CM | POA: Diagnosis not present

## 2013-11-18 LAB — POCT HEMOGLOBIN-HEMACUE: HEMOGLOBIN: 11.5 g/dL — AB (ref 13.0–17.0)

## 2013-11-18 MED ORDER — EPOETIN ALFA 20000 UNIT/ML IJ SOLN
20000.0000 [IU] | INTRAMUSCULAR | Status: DC
  Administered 2013-11-18: 20000 [IU] via SUBCUTANEOUS

## 2013-11-18 MED ORDER — EPOETIN ALFA 20000 UNIT/ML IJ SOLN
INTRAMUSCULAR | Status: AC
  Filled 2013-11-18: qty 1

## 2013-12-02 ENCOUNTER — Encounter (HOSPITAL_COMMUNITY): Payer: 59

## 2013-12-02 ENCOUNTER — Encounter (HOSPITAL_COMMUNITY)
Admission: RE | Admit: 2013-12-02 | Discharge: 2013-12-02 | Disposition: A | Discharge status: Home / Self Care | Payer: 59 | Source: Ambulatory Visit | Attending: Nephrology | Admitting: Nephrology

## 2013-12-02 DIAGNOSIS — N184 Chronic kidney disease, stage 4 (severe): Secondary | ICD-10-CM | POA: Diagnosis not present

## 2013-12-02 DIAGNOSIS — D631 Anemia in chronic kidney disease: Secondary | ICD-10-CM | POA: Insufficient documentation

## 2013-12-02 LAB — IRON AND TIBC
Iron: 53 ug/dL (ref 42–135)
SATURATION RATIOS: 20 % (ref 20–55)
TIBC: 271 ug/dL (ref 215–435)
UIBC: 218 ug/dL (ref 125–400)

## 2013-12-02 LAB — RENAL FUNCTION PANEL
Albumin: 3.1 g/dL — ABNORMAL LOW (ref 3.5–5.2)
Anion gap: 15 (ref 5–15)
BUN: 64 mg/dL — ABNORMAL HIGH (ref 6–23)
CALCIUM: 9 mg/dL (ref 8.4–10.5)
CO2: 20 mEq/L (ref 19–32)
CREATININE: 5.13 mg/dL — AB (ref 0.50–1.35)
Chloride: 103 mEq/L (ref 96–112)
GFR calc Af Amer: 15 mL/min — ABNORMAL LOW (ref >90)
GFR calc non Af Amer: 13 mL/min — ABNORMAL LOW (ref >90)
GLUCOSE: 155 mg/dL — AB (ref 70–99)
Phosphorus: 4.8 mg/dL — ABNORMAL HIGH (ref 2.3–4.6)
Potassium: 4.7 mEq/L (ref 3.7–5.3)
Sodium: 138 mEq/L (ref 137–147)

## 2013-12-02 LAB — FERRITIN: FERRITIN: 106 ng/mL (ref 22–322)

## 2013-12-02 LAB — POCT HEMOGLOBIN-HEMACUE: Hemoglobin: 10.4 g/dL — ABNORMAL LOW (ref 13.0–17.0)

## 2013-12-02 MED ORDER — EPOETIN ALFA 20000 UNIT/ML IJ SOLN
20000.0000 [IU] | INTRAMUSCULAR | Status: DC
  Administered 2013-12-02: 20000 [IU] via SUBCUTANEOUS

## 2013-12-02 MED ORDER — EPOETIN ALFA 20000 UNIT/ML IJ SOLN
INTRAMUSCULAR | Status: AC
  Filled 2013-12-02: qty 1

## 2013-12-05 ENCOUNTER — Encounter: Payer: Self-pay | Admitting: Internal Medicine

## 2013-12-05 LAB — PTH, INTACT AND CALCIUM
CALCIUM TOTAL (PTH): 8.8 mg/dL (ref 8.4–10.5)
PTH: 195 pg/mL — AB (ref 14–64)

## 2013-12-14 ENCOUNTER — Other Ambulatory Visit (HOSPITAL_COMMUNITY): Payer: Self-pay | Admitting: *Deleted

## 2013-12-15 ENCOUNTER — Encounter (HOSPITAL_COMMUNITY)
Admission: RE | Admit: 2013-12-15 | Discharge: 2013-12-15 | Disposition: A | Discharge status: Home / Self Care | Payer: 59 | Source: Ambulatory Visit | Attending: Nephrology | Admitting: Nephrology

## 2013-12-15 DIAGNOSIS — D631 Anemia in chronic kidney disease: Secondary | ICD-10-CM | POA: Diagnosis not present

## 2013-12-15 LAB — POCT HEMOGLOBIN-HEMACUE: Hemoglobin: 11.1 g/dL — ABNORMAL LOW (ref 13.0–17.0)

## 2013-12-15 MED ORDER — EPOETIN ALFA 20000 UNIT/ML IJ SOLN
20000.0000 [IU] | INTRAMUSCULAR | Status: DC
  Administered 2013-12-15: 20000 [IU] via SUBCUTANEOUS

## 2013-12-15 MED ORDER — SODIUM CHLORIDE 0.9 % IV SOLN
1020.0000 mg | Freq: Once | INTRAVENOUS | Status: AC
  Administered 2013-12-15: 1020 mg via INTRAVENOUS
  Filled 2013-12-15: qty 34

## 2013-12-15 MED ORDER — EPOETIN ALFA 20000 UNIT/ML IJ SOLN
INTRAMUSCULAR | Status: AC
  Filled 2013-12-15: qty 1

## 2013-12-29 ENCOUNTER — Other Ambulatory Visit: Payer: Self-pay | Admitting: Internal Medicine

## 2013-12-29 DIAGNOSIS — I150 Renovascular hypertension: Secondary | ICD-10-CM

## 2013-12-30 ENCOUNTER — Encounter (HOSPITAL_COMMUNITY)
Admission: RE | Admit: 2013-12-30 | Discharge: 2013-12-30 | Disposition: A | Discharge status: Home / Self Care | Payer: 59 | Source: Ambulatory Visit | Attending: Nephrology | Admitting: Nephrology

## 2013-12-30 DIAGNOSIS — D631 Anemia in chronic kidney disease: Secondary | ICD-10-CM | POA: Insufficient documentation

## 2013-12-30 DIAGNOSIS — N184 Chronic kidney disease, stage 4 (severe): Secondary | ICD-10-CM | POA: Diagnosis not present

## 2013-12-30 LAB — POCT HEMOGLOBIN-HEMACUE: HEMOGLOBIN: 12.1 g/dL — AB (ref 13.0–17.0)

## 2013-12-30 MED ORDER — EPOETIN ALFA 20000 UNIT/ML IJ SOLN: 20000.0000 [IU] | INTRAMUSCULAR | Status: DC

## 2013-12-31 NOTE — Telephone Encounter (Signed)
[name] needs a clinic appointment this week. He no showed his appt in August and has not been seen since July. Any provider is ok. Thanks.

## 2014-01-12 ENCOUNTER — Other Ambulatory Visit (HOSPITAL_COMMUNITY): Payer: Self-pay | Admitting: *Deleted

## 2014-01-12 ENCOUNTER — Ambulatory Visit (INDEPENDENT_AMBULATORY_CARE_PROVIDER_SITE_OTHER): Payer: 59 | Admitting: Ophthalmology

## 2014-01-13 ENCOUNTER — Encounter (HOSPITAL_COMMUNITY): Payer: 59

## 2014-01-24 ENCOUNTER — Encounter (HOSPITAL_COMMUNITY)
Admission: RE | Admit: 2014-01-24 | Discharge: 2014-01-24 | Disposition: A | Discharge status: Home / Self Care | Payer: 59 | Source: Ambulatory Visit | Attending: Nephrology | Admitting: Nephrology

## 2014-01-24 DIAGNOSIS — N184 Chronic kidney disease, stage 4 (severe): Secondary | ICD-10-CM | POA: Insufficient documentation

## 2014-01-24 DIAGNOSIS — D631 Anemia in chronic kidney disease: Secondary | ICD-10-CM | POA: Diagnosis not present

## 2014-01-24 LAB — RENAL FUNCTION PANEL
ALBUMIN: 3.3 g/dL — AB (ref 3.5–5.2)
Anion gap: 17 — ABNORMAL HIGH (ref 5–15)
BUN: 63 mg/dL — AB (ref 6–23)
CHLORIDE: 101 meq/L (ref 96–112)
CO2: 20 mEq/L (ref 19–32)
Calcium: 9 mg/dL (ref 8.4–10.5)
Creatinine, Ser: 5.52 mg/dL — ABNORMAL HIGH (ref 0.50–1.35)
GFR calc Af Amer: 13 mL/min — ABNORMAL LOW (ref >90)
GFR calc non Af Amer: 11 mL/min — ABNORMAL LOW (ref >90)
GLUCOSE: 241 mg/dL — AB (ref 70–99)
Phosphorus: 5.4 mg/dL — ABNORMAL HIGH (ref 2.3–4.6)
Potassium: 4.6 mEq/L (ref 3.7–5.3)
Sodium: 138 mEq/L (ref 137–147)

## 2014-01-24 LAB — FERRITIN: FERRITIN: 490 ng/mL — AB (ref 22–322)

## 2014-01-24 LAB — IRON AND TIBC
IRON: 90 ug/dL (ref 42–135)
SATURATION RATIOS: 34 % (ref 20–55)
TIBC: 268 ug/dL (ref 215–435)
UIBC: 178 ug/dL (ref 125–400)

## 2014-01-24 LAB — POCT HEMOGLOBIN-HEMACUE: HEMOGLOBIN: 11.6 g/dL — AB (ref 13.0–17.0)

## 2014-01-24 MED ORDER — EPOETIN ALFA 20000 UNIT/ML IJ SOLN
INTRAMUSCULAR | Status: AC
  Filled 2014-01-24: qty 1

## 2014-01-24 MED ORDER — EPOETIN ALFA 20000 UNIT/ML IJ SOLN
20000.0000 [IU] | INTRAMUSCULAR | Status: DC
  Administered 2014-01-24: 20000 [IU] via SUBCUTANEOUS

## 2014-01-25 LAB — PTH, INTACT AND CALCIUM
Calcium, Total (PTH): 8.5 mg/dL (ref 8.4–10.5)
PTH: 258 pg/mL — ABNORMAL HIGH (ref 14–64)

## 2014-02-10 ENCOUNTER — Ambulatory Visit: Payer: 59 | Admitting: Internal Medicine

## 2014-02-21 ENCOUNTER — Encounter (HOSPITAL_COMMUNITY): Payer: 59

## 2014-02-21 ENCOUNTER — Encounter (HOSPITAL_COMMUNITY)
Admission: RE | Admit: 2014-02-21 | Discharge: 2014-02-21 | Disposition: A | Discharge status: Home / Self Care | Payer: 59 | Source: Ambulatory Visit | Attending: Nephrology | Admitting: Nephrology

## 2014-02-21 DIAGNOSIS — D631 Anemia in chronic kidney disease: Secondary | ICD-10-CM | POA: Diagnosis not present

## 2014-02-21 LAB — POCT HEMOGLOBIN-HEMACUE: Hemoglobin: 11.7 g/dL — ABNORMAL LOW (ref 13.0–17.0)

## 2014-02-21 MED ORDER — EPOETIN ALFA 20000 UNIT/ML IJ SOLN: 20000.0000 [IU] | INTRAMUSCULAR | Status: DC

## 2014-02-21 MED ORDER — EPOETIN ALFA 20000 UNIT/ML IJ SOLN
INTRAMUSCULAR | Status: AC
Start: 2014-02-21 — End: 2014-02-21
  Administered 2014-02-21: 20000 [IU] via SUBCUTANEOUS
  Filled 2014-02-21: qty 1

## 2014-03-10 ENCOUNTER — Other Ambulatory Visit: Payer: Self-pay | Admitting: Internal Medicine

## 2014-03-21 ENCOUNTER — Inpatient Hospital Stay (HOSPITAL_COMMUNITY): Admission: RE | Admit: 2014-03-21 | Payer: 59 | Source: Ambulatory Visit

## 2014-04-11 ENCOUNTER — Inpatient Hospital Stay (HOSPITAL_COMMUNITY): Admission: RE | Admit: 2014-04-11 | Payer: Self-pay | Source: Ambulatory Visit

## 2014-04-28 ENCOUNTER — Encounter (HOSPITAL_COMMUNITY)
Admission: RE | Admit: 2014-04-28 | Discharge: 2014-04-28 | Disposition: A | Discharge status: Home / Self Care | Payer: 59 | Source: Ambulatory Visit | Attending: Nephrology | Admitting: Nephrology

## 2014-04-28 DIAGNOSIS — D631 Anemia in chronic kidney disease: Secondary | ICD-10-CM | POA: Diagnosis present

## 2014-04-28 DIAGNOSIS — N184 Chronic kidney disease, stage 4 (severe): Secondary | ICD-10-CM | POA: Diagnosis not present

## 2014-04-28 LAB — IRON AND TIBC
Iron: 59 ug/dL (ref 42–165)
SATURATION RATIOS: 21 % (ref 20–55)
TIBC: 278 ug/dL (ref 215–435)
UIBC: 219 ug/dL (ref 125–400)

## 2014-04-28 LAB — RENAL FUNCTION PANEL
Albumin: 3.3 g/dL — ABNORMAL LOW (ref 3.5–5.2)
Anion gap: 14 (ref 5–15)
BUN: 70 mg/dL — AB (ref 6–23)
CALCIUM: 8.4 mg/dL (ref 8.4–10.5)
CHLORIDE: 105 mmol/L (ref 96–112)
CO2: 19 mmol/L (ref 19–32)
Creatinine, Ser: 6.82 mg/dL — ABNORMAL HIGH (ref 0.50–1.35)
GFR calc Af Amer: 10 mL/min — ABNORMAL LOW (ref >90)
GFR calc non Af Amer: 9 mL/min — ABNORMAL LOW (ref >90)
Glucose, Bld: 147 mg/dL — ABNORMAL HIGH (ref 70–99)
Phosphorus: 6.7 mg/dL — ABNORMAL HIGH (ref 2.3–4.6)
Potassium: 4.6 mmol/L (ref 3.5–5.1)
Sodium: 138 mmol/L (ref 135–145)

## 2014-04-28 LAB — FERRITIN: Ferritin: 438 ng/mL — ABNORMAL HIGH (ref 22–322)

## 2014-04-28 LAB — POCT HEMOGLOBIN-HEMACUE: Hemoglobin: 8.5 g/dL — ABNORMAL LOW (ref 13.0–17.0)

## 2014-04-28 MED ORDER — EPOETIN ALFA 20000 UNIT/ML IJ SOLN
INTRAMUSCULAR | Status: AC
  Filled 2014-04-28: qty 1

## 2014-04-28 MED ORDER — EPOETIN ALFA 20000 UNIT/ML IJ SOLN
20000.0000 [IU] | INTRAMUSCULAR | Status: DC
  Administered 2014-04-28: 20000 [IU] via SUBCUTANEOUS

## 2014-04-29 LAB — PTH, INTACT AND CALCIUM
Calcium, Total (PTH): 8.5 mg/dL — ABNORMAL LOW (ref 8.7–10.2)
PTH: 259 pg/mL — ABNORMAL HIGH (ref 15–65)

## 2014-05-15 ENCOUNTER — Other Ambulatory Visit: Payer: Self-pay | Admitting: Internal Medicine

## 2014-05-15 NOTE — Telephone Encounter (Signed)
Austin Mcdaniel is very overdue for a follow up appointment. I have already refilled his labetalol once with the understanding that he needed to be seen in the clinic for future refills. He will need to be seen before the labetalol can be refilled.

## 2014-05-17 NOTE — Telephone Encounter (Signed)
Pt unable to get in with Dr Sherrine MaplesGlenn until 5/19. He will be out of meds next week. I scheduled with Dr Valentino Saxonothman on 3/30

## 2014-05-23 ENCOUNTER — Telehealth: Payer: Self-pay | Admitting: Internal Medicine

## 2014-05-23 NOTE — Telephone Encounter (Signed)
Call to patient to confirm appointment for 05/24/14 at 8:45. [email or web address]

## 2014-05-24 ENCOUNTER — Encounter (HOSPITAL_COMMUNITY)
Admission: RE | Admit: 2014-05-24 | Discharge: 2014-05-24 | Disposition: A | Discharge status: Home / Self Care | Payer: 59 | Source: Ambulatory Visit | Attending: Nephrology | Admitting: Nephrology

## 2014-05-24 ENCOUNTER — Encounter: Payer: Self-pay | Admitting: Internal Medicine

## 2014-05-24 ENCOUNTER — Ambulatory Visit (INDEPENDENT_AMBULATORY_CARE_PROVIDER_SITE_OTHER): Payer: 59 | Admitting: Internal Medicine

## 2014-05-24 VITALS — BP 188/95 | HR 83 | Temp 98.3°F | Ht 73.0 in | Wt 290.0 lb

## 2014-05-24 DIAGNOSIS — N184 Chronic kidney disease, stage 4 (severe): Secondary | ICD-10-CM | POA: Insufficient documentation

## 2014-05-24 DIAGNOSIS — E1159 Type 2 diabetes mellitus with other circulatory complications: Secondary | ICD-10-CM | POA: Diagnosis not present

## 2014-05-24 DIAGNOSIS — N185 Chronic kidney disease, stage 5: Secondary | ICD-10-CM | POA: Diagnosis not present

## 2014-05-24 DIAGNOSIS — I120 Hypertensive chronic kidney disease with stage 5 chronic kidney disease or end stage renal disease: Secondary | ICD-10-CM | POA: Diagnosis not present

## 2014-05-24 DIAGNOSIS — E1122 Type 2 diabetes mellitus with diabetic chronic kidney disease: Secondary | ICD-10-CM | POA: Diagnosis not present

## 2014-05-24 DIAGNOSIS — Z794 Long term (current) use of insulin: Secondary | ICD-10-CM

## 2014-05-24 DIAGNOSIS — I150 Renovascular hypertension: Secondary | ICD-10-CM

## 2014-05-24 DIAGNOSIS — D631 Anemia in chronic kidney disease: Secondary | ICD-10-CM | POA: Diagnosis not present

## 2014-05-24 DIAGNOSIS — Z9119 Patient's noncompliance with other medical treatment and regimen: Secondary | ICD-10-CM

## 2014-05-24 LAB — RENAL FUNCTION PANEL
Albumin: 3.3 g/dL — ABNORMAL LOW (ref 3.5–5.2)
Anion gap: 6 (ref 5–15)
BUN: 66 mg/dL — ABNORMAL HIGH (ref 6–23)
CHLORIDE: 109 mmol/L (ref 96–112)
CO2: 24 mmol/L (ref 19–32)
Calcium: 8.7 mg/dL (ref 8.4–10.5)
Creatinine, Ser: 6.85 mg/dL — ABNORMAL HIGH (ref 0.50–1.35)
GFR calc Af Amer: 10 mL/min — ABNORMAL LOW (ref >90)
GFR, EST NON AFRICAN AMERICAN: 9 mL/min — AB (ref >90)
Glucose, Bld: 176 mg/dL — ABNORMAL HIGH (ref 70–99)
PHOSPHORUS: 5.5 mg/dL — AB (ref 2.3–4.6)
Potassium: 4.9 mmol/L (ref 3.5–5.1)
Sodium: 139 mmol/L (ref 135–145)

## 2014-05-24 LAB — POCT GLYCOSYLATED HEMOGLOBIN (HGB A1C): HEMOGLOBIN A1C: 7.2

## 2014-05-24 LAB — FERRITIN: Ferritin: 281 ng/mL (ref 22–322)

## 2014-05-24 LAB — IRON AND TIBC
Iron: 52 ug/dL (ref 42–165)
Saturation Ratios: 18 % — ABNORMAL LOW (ref 20–55)
TIBC: 285 ug/dL (ref 215–435)
UIBC: 233 ug/dL (ref 125–400)

## 2014-05-24 LAB — POCT HEMOGLOBIN-HEMACUE: Hemoglobin: 8.6 g/dL — ABNORMAL LOW (ref 13.0–17.0)

## 2014-05-24 LAB — GLUCOSE, CAPILLARY: Glucose-Capillary: 172 mg/dL — ABNORMAL HIGH (ref 70–99)

## 2014-05-24 MED ORDER — EPOETIN ALFA 20000 UNIT/ML IJ SOLN
INTRAMUSCULAR | Status: AC
  Administered 2014-05-24: 20000 [IU] via SUBCUTANEOUS
  Filled 2014-05-24: qty 1

## 2014-05-24 MED ORDER — LABETALOL HCL 100 MG PO TABS: 100.0000 mg | ORAL_TABLET | Freq: Two times a day (BID) | ORAL | Status: DC

## 2014-05-24 MED ORDER — FUROSEMIDE 20 MG PO TABS: 20.0000 mg | ORAL_TABLET | Freq: Every day | ORAL | Status: DC

## 2014-05-24 MED ORDER — EPOETIN ALFA 20000 UNIT/ML IJ SOLN: 20000.0000 [IU] | INTRAMUSCULAR | Status: DC

## 2014-05-24 NOTE — Assessment & Plan Note (Signed)
Austin Mcdaniel has CKD but issues with non-compliance with appointments. He has not been seen here in nearly a year. I stressed the importance of tight BP and glycemic control for his renal health. He seems to understand that he might need HD. I called [facility] who says he has cancelled his past two appointments since he was last seen 09/2013. He is still getting aranesp injections through Dr Kathrene BongoGoldsborough with last BMP 04/28/14 and creatinine 6.82. -encouraged to [facility]/15/[facility]

## 2014-05-24 NOTE — Progress Notes (Signed)
   Subjective:    Patient ID: Austin Mcdaniel, male    DOB: 08-27-1970, 44 y.o.   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 Please see problem-based charting assessment and plan note for further details of medical issues addressed at today's visit.   Review of Systems  Constitutional: Negative for fever, chills and diaphoresis.  Respiratory: Negative for cough and shortness of breath.   Cardiovascular: Negative for chest pain.  Gastrointestinal: Negative for nausea, vomiting, abdominal pain, diarrhea and constipation.  Neurological: Negative for weakness, light-headedness, numbness and headaches.       Objective:   Physical Exam  Constitutional: He is oriented to person, place, and time. He appears well-developed and well-nourished. No distress.  HENT:  Head: Normocephalic and atraumatic.  Mouth/Throat: Oropharynx is clear and moist.  Eyes: EOM are normal. Pupils are equal, round, and reactive to light.  Cardiovascular: Normal rate, regular rhythm, normal heart sounds and intact distal pulses.  Exam reveals no gallop and no friction rub.   No murmur heard. Pulmonary/Chest: Effort normal and breath sounds normal. No respiratory distress. He has no wheezes.  Abdominal: Soft. Bowel sounds are normal. He exhibits no distension. There is no tenderness.  Musculoskeletal: He exhibits no edema.  L BKA  Neurological: He is alert and oriented to person, place, and time.  Skin: He is not diaphoretic.  Vitals reviewed.         Assessment & Plan:

## 2014-05-24 NOTE — Assessment & Plan Note (Signed)
Lab Results  Component Value Date   HGBA1C 7.2 05/24/2014   HGBA1C 5.9 08/18/2013   HGBA1C 6.6 09/13/2012     Assessment: Diabetes control: fair control Progress toward A1C goal:  deteriorated Comments: A1c 7.2 up from 5.9 in 07/2013. He says he is compliant with his 70/30 10 u bid but maybe once a week misses a dose. No changes in his diet so he is unsure why his A1c rose  Plan: Medications:  continue current medications Home glucose monitoring: Frequency:   Timing:   Instruction/counseling given: reminded to bring blood glucose meter & log to each visit and reminded to bring medications to each visit Educational resources provided: brochure Self management tools provided:   Other plans: none

## 2014-05-24 NOTE — Assessment & Plan Note (Signed)
BP Readings from Last 3 Encounters:  05/24/14 188/95  04/28/14 178/88  01/24/14 180/88    Lab Results  Component Value Date   NA 138 04/28/2014   K 4.6 04/28/2014   CREATININE 6.82* 04/28/2014    Assessment: Blood pressure control: moderately elevated Progress toward BP goal:  deteriorated Comments: Austin Mcdaniel has been out of his antihypertensives for a week which is likely why it is elevated. He says he is still taking the labetalol 100 mg bid but that his nephrologist Dr Kathrene BongoGoldsborough stopped his HCTZ 25 mg daily and started him on lasix. He thinks it is 20 mg daily but the note we have from 09/2013 says it was lasix 40 mg bid. We called [facility] and who says has cancelled two renal appointments and not been seen since then and is scheduled to see them 06/15/14.  Plan: Medications:  Continue labetalol 100 mg bid. Stop HCTZ 25 mg daily as this is likely not effective in his CKD. Start lasix 20 mg daily as this is the dose he thinks he is taking and can titrate up. Educational resources provided: brochure Self management tools provided:   Other plans: RTC 1 week with BMP at that time for reassessment and titration of antihypertensives

## 2014-05-24 NOTE — Progress Notes (Signed)
Internal Medicine Clinic Attending  Case discussed with Dr. Rothman at the time of the visit.  We reviewed the resident's history and exam and pertinent patient test results.  I agree with the assessment, diagnosis, and plan of care documented in the resident's note. 

## 2014-05-24 NOTE — Patient Instructions (Signed)
It was a pleasure to see you today. We have refilled your labetalol and given you a two week prescription for lasix 20 mg daily. We will see you again in a week for blood pressure recheck. Please return to clinic or seek medical attention if you have any new or worsening trouble breathing, chest pain, or other worrisome medical condition. We look forward to seeing you again in about a week.  Farley LyAdam Zaire Vanbuskirk, MD  General Instructions:   Please try to bring all your medicines next time. This will help us keep you safe from mistakes.   Progress Toward Treatment Goals:  Treatment Goal 05/24/2014  Hemoglobin A1C deteriorated  Blood pressure deteriorated  Stop smoking -    Self Care Goals & Plans:  Self Care Goal 05/24/2014  Manage my medications take my medicines as prescribed; bring my medications to every visit; refill my medications on time  Monitor my health keep track of my blood glucose; bring my glucose meter and log to each visit  Eat healthy foods drink diet soda or water instead of juice or soda; eat more vegetables; eat foods that are low in salt; eat baked foods instead of fried foods; eat fruit for snacks and desserts  Be physically active take a walk every day  Stop smoking (No Data)  Meeting treatment goals -    Home Blood Glucose Monitoring 09/09/2013  Check my blood sugar 3 times a day  When to check my blood sugar before meals     Care Management & Community Referrals:  Referral 09/09/2013  Referrals made for care management support -  Referrals made to community resources none

## 2014-05-25 LAB — PTH, INTACT AND CALCIUM
Calcium, Total (PTH): 8.6 mg/dL — ABNORMAL LOW (ref 8.7–10.2)
PTH: 383 pg/mL — ABNORMAL HIGH (ref 15–65)

## 2014-05-26 ENCOUNTER — Encounter (HOSPITAL_COMMUNITY): Payer: Self-pay

## 2014-06-02 ENCOUNTER — Encounter: Payer: Self-pay | Admitting: Internal Medicine

## 2014-06-02 ENCOUNTER — Ambulatory Visit (INDEPENDENT_AMBULATORY_CARE_PROVIDER_SITE_OTHER): Payer: 59 | Admitting: Internal Medicine

## 2014-06-02 VITALS — BP 140/79 | HR 80 | Temp 97.8°F | Ht 73.0 in | Wt 293.1 lb

## 2014-06-02 DIAGNOSIS — I10 Essential (primary) hypertension: Secondary | ICD-10-CM | POA: Diagnosis not present

## 2014-06-02 DIAGNOSIS — E1159 Type 2 diabetes mellitus with other circulatory complications: Secondary | ICD-10-CM

## 2014-06-02 DIAGNOSIS — E785 Hyperlipidemia, unspecified: Secondary | ICD-10-CM | POA: Diagnosis not present

## 2014-06-02 DIAGNOSIS — E1151 Type 2 diabetes mellitus with diabetic peripheral angiopathy without gangrene: Secondary | ICD-10-CM | POA: Diagnosis not present

## 2014-06-02 DIAGNOSIS — I150 Renovascular hypertension: Secondary | ICD-10-CM | POA: Diagnosis not present

## 2014-06-02 DIAGNOSIS — N184 Chronic kidney disease, stage 4 (severe): Secondary | ICD-10-CM

## 2014-06-02 LAB — BASIC METABOLIC PANEL WITH GFR
BUN: 73 mg/dL — ABNORMAL HIGH (ref 6–23)
CALCIUM: 8.4 mg/dL (ref 8.4–10.5)
CO2: 22 meq/L (ref 19–32)
Chloride: 107 mEq/L (ref 96–112)
Creat: 6.82 mg/dL — ABNORMAL HIGH (ref 0.50–1.35)
GFR, Est African American: 10 mL/min — ABNORMAL LOW
GFR, Est Non African American: 9 mL/min — ABNORMAL LOW
GLUCOSE: 115 mg/dL — AB (ref 70–99)
Potassium: 4.8 mEq/L (ref 3.5–5.3)
Sodium: 142 mEq/L (ref 135–145)

## 2014-06-02 MED ORDER — PRAVASTATIN SODIUM 40 MG PO TABS: 40.0000 mg | ORAL_TABLET | Freq: Every day | ORAL | Status: DC

## 2014-06-02 MED ORDER — LABETALOL HCL 100 MG PO TABS: 100.0000 mg | ORAL_TABLET | Freq: Two times a day (BID) | ORAL | Status: AC

## 2014-06-02 MED ORDER — RELION LANCETS THIN 26G MISC: 1.0000 [IU] | Freq: Three times a day (TID) | Status: DC

## 2014-06-02 MED ORDER — FUROSEMIDE 40 MG PO TABS: 40.0000 mg | ORAL_TABLET | Freq: Every day | ORAL | Status: DC

## 2014-06-02 MED ORDER — GLUCOSE BLOOD VI STRP: ORAL_STRIP | Status: DC

## 2014-06-02 MED ORDER — RELION PRIME MONITOR DEVI: 1.0000 [IU] | Freq: Three times a day (TID) | Status: DC

## 2014-06-02 MED ORDER — INSULIN LISPRO PROT & LISPRO (75-25 MIX) 100 UNIT/ML KWIKPEN: 12.0000 [IU] | PEN_INJECTOR | Freq: Two times a day (BID) | SUBCUTANEOUS | Status: AC

## 2014-06-02 MED ORDER — INSULIN PEN NEEDLE 31G X 5 MM MISC: 1.0000 [IU] | Status: DC | PRN

## 2014-06-02 NOTE — Assessment & Plan Note (Addendum)
Lab Results  Component Value Date   HGBA1C 7.2 05/24/2014   HGBA1C 5.9 08/18/2013   HGBA1C 6.6 09/13/2012     Assessment: Diabetes control: fair control Progress toward A1C goal:  unchanged Comments: A1c may be inaccurate in his stage of renal disease  Plan: Medications:  Patient was on Novolin 70/30 10u BID but has questionable compliance.  Will try to improve compliance with a pen which he is intersted in.  I will try the Humalog 75/25 pen as this appears covered by his insurance.  I will also increase to 12 u BID based on his reported numbers.  He will also start checking his sugars more often and I have sent diabetic testing supplies to help with this. Home glucose monitoring: Frequency: 3 times a day Timing: before meals Instruction/counseling given: reminded to bring blood glucose meter & log to each visit, reminded to bring medications to each visit and discussed diet Educational resources provided:   Self management tools provided:   Other plans: has follow up appointment scheduled for late may.  I cautioned patient if he become hypoglycemic to reduce dose back to 10 units BID.

## 2014-06-02 NOTE — Assessment & Plan Note (Signed)
-   BP better controlled today, he has not been following up with [facility] but reports he will keep his appointment in 2 weeks. - Will try to maintain better BP and glucose control. - BMP today for renal function and potassium.

## 2014-06-02 NOTE — Progress Notes (Signed)
Case discussed with Dr. Hoffman at the time of the visit.  We reviewed the resident's history and exam and pertinent patient test results.  I agree with the assessment, diagnosis, and plan of care documented in the resident's note. 

## 2014-06-02 NOTE — Assessment & Plan Note (Signed)
BP Readings from Last 3 Encounters:  06/02/14 140/79  05/24/14 176/86  05/24/14 188/95    Lab Results  Component Value Date   NA 139 05/24/2014   K 4.9 05/24/2014   CREATININE 6.85* 05/24/2014    Assessment: Blood pressure control: controlled Progress toward BP goal:  improved Comments: taking medication  Plan: Medications:  Continue Labetalol 100mg  BID, will increase lasix to 40mg  daily Educational resources provided:   Self management tools provided:   Other plans: I doubt 20mg  of lasix will be very effective with his renal disease and will try 40mg  this may help some with his BP.  Will also repeat a BMP today.  He has follow up with nephrology in 2 weeks.

## 2014-06-02 NOTE — Patient Instructions (Signed)
General Instructions:   Please bring your medicines with you each time you come to clinic.  Medicines may include prescription medications, over-the-counter medications, herbal remedies, eye drops, vitamins, or other pills.   Progress Toward Treatment Goals:  Treatment Goal 06/02/2014  Hemoglobin A1C unchanged  Blood pressure improved  Stop smoking -    Self Care Goals & Plans:  Self Care Goal 06/02/2014  Manage my medications take my medicines as prescribed; bring my medications to every visit; refill my medications on time  Monitor my health keep track of my blood glucose; bring my glucose meter and log to each visit  Eat healthy foods drink diet soda or water instead of juice or soda; eat more vegetables; eat foods that are low in salt; eat baked foods instead of fried foods; eat fruit for snacks and desserts  Be physically active take a walk every day  Stop smoking -  Meeting treatment goals -    Home Blood Glucose Monitoring 06/02/2014  Check my blood sugar 3 times a day  When to check my blood sugar before meals     Care Management & Community Referrals:  Referral 06/02/2014  Referrals made for care management support none needed  Referrals made to community resources -

## 2014-06-02 NOTE — Assessment & Plan Note (Signed)
-   restart pravastatin - will recheck Lipid panel in a few months PCP may consider rechecking a CK level if indicated.

## 2014-06-02 NOTE — Progress Notes (Signed)
[facility] Subjective:   Patient ID: Austin Mcdaniel male   DOB: 1970-10-14 44 y.o.   MRN: [id number]  HPI: Austin Mcdaniel is a 44 y.o. male with a PMH detailed below who presents for 1 week follow up for blood pressure and diabetes control.  At his last visit he was non complaint with his blood pressure medications and was found to be very hypertensive.  He now reports that he is taking him BP medications and is doing very well.  He denies any headaches, changes in vision.  He reports he is urinating about 6 times a day with lasix  daily.  DM He notes that he frequently misses doses of insulin and this likely contributes to his poor control.  He reports that his AM sugars are usually >140 and does not test 3 times a day like he should.  He admits this is partially due to the fact that his meter is broken and he shares his fiance's meter.  HLD It appears that in 2015 his simvastatin was held to to some mild CK elevation and he was to start back on pravastatin.  This never happened, he denies ever having myalgias on statin medications and was admitted for renal failure at that time.    Past Medical History  Diagnosis Date  . Acid reflux   . Diabetic foot ulcer 06/2011; 10/2011-12/2011    left dorsal foot  (01/03/2012)  . Osteomyelitis of left foot   . Type II diabetes mellitus   . Hypertension   . Chronic kidney disease     CKD STAGE 4   Current Outpatient Prescriptions  Medication Sig Dispense Refill  . furosemide (LASIX) 40 MG tablet Take 1 tablet (40 mg total) by mouth daily. 30 tablet 5  . labetalol (NORMODYNE) 100 MG tablet Take 1 tablet (100 mg total) by mouth 2 (two) times daily. 60 tablet 5  . Blood Glucose Monitoring Suppl (RELION PRIME MONITOR) DEVI 1 Units by Does not apply route 3 (three) times daily before meals. 100 Device 11  . glucose blood (RELION PRIME TEST) test strip Use as instructed 100 each 12  . Insulin Lispro Prot & Lispro (HUMALOG MIX  75/25 KWIKPEN) (75-25) 100 UNIT/ML Kwikpen Inject 12 Units into the skin 2 (two) times daily. 15 mL 5  . Insulin Pen Needle 31G X 5 MM MISC 1 Units by Does not apply route as needed. 100 each 11  . pravastatin (PRAVACHOL) 40 MG tablet Take 1 tablet (40 mg total) by mouth daily. 30 tablet 11  . RELION LANCETS THIN 26G MISC 1 Units by Does not apply route 3 (three) times daily before meals. 100 each 11   No current facility-administered medications for this visit.   No family history on file. History   Social History  . Marital Status: Legally Separated    Spouse Name: N/A  . Number of Children: N/A  . Years of Education: N/A   Social History Main Topics  . Smoking status: Light Tobacco Smoker -- 0.10 packs/day for 7 years    Types: Cigars  . Smokeless tobacco: Current User    Types: Snuff     Comment: 01/03/2012 "quit cigarettes 15-20 yr ago; occasionally smoke a pipe; dip rarely - last time ~ 3 months ago"  07/2013 "occasional cigar"  . Alcohol Use: 1.2 oz/week    2 Cans of beer per week     Comment: 01/03/2012 "couple beers once/wk"  . Drug Use: No  .  Sexual Activity: Yes   Other Topics Concern  . None   Social History Narrative   Review of Systems: Review of Systems  Constitutional: Negative for fever.  Eyes: Negative for blurred vision.  Respiratory: Negative for cough and shortness of breath.   Cardiovascular: Negative for chest pain.  Gastrointestinal: Negative for heartburn.  Genitourinary: Negative for dysuria and hematuria.  Musculoskeletal: Negative for myalgias.  Skin: Negative for rash.  Neurological: Negative for headaches.  Endo/Heme/Allergies: Negative for polydipsia.  Psychiatric/Behavioral: Negative for depression and substance abuse.     Objective:  Physical Exam: Filed Vitals:   06/02/14 0939  BP: 140/79  Pulse: 80  Temp: 97.8 F (36.6 C)  TempSrc: Oral  Height:  (1.854 m)  Weight: 293 lb 1.6 oz (132.949 kg)  SpO2: 98%  Physical Exam   Constitutional: He is well-developed, well-nourished, and in no distress. No distress.  HENT:  Head: Normocephalic and atraumatic.  Eyes: Conjunctivae are normal.  Cardiovascular: Normal rate, regular rhythm, normal heart sounds and intact distal pulses.   No murmur heard. Pulmonary/Chest: Effort normal and breath sounds normal. No respiratory distress. He has no wheezes. He has no rales.  Abdominal: Soft. Bowel sounds are normal. He exhibits no distension. There is no tenderness.  Musculoskeletal: He exhibits no edema.  Skin: Skin is warm and dry. He is not diaphoretic.  Psychiatric: Affect and judgment normal.  Nursing note and vitals reviewed.   Assessment & Plan:  Case discussed with Dr. Dalphine Handing  HLD (hyperlipidemia) - restart pravastatin - will recheck Lipid panel in a few months PCP may consider rechecking a CK level if indicated.   Chronic kidney disease (CKD), stage IV (severe) - BP better controlled today, he has not been following up with [facility] but reports he will keep his appointment in 2 weeks. - Will try to maintain better BP and glucose control. - BMP today for renal function and potassium.   DM type 2 causing vascular disease Lab Results  Component Value Date   HGBA1C 7.2 05/24/2014   HGBA1C 5.9 08/18/2013   HGBA1C 6.6 09/13/2012     Assessment: Diabetes control: fair control Progress toward A1C goal:  unchanged Comments: A1c may be inaccurate in his stage of renal disease  Plan: Medications:  Patient was on Novolin 70/30 10u BID but has questionable compliance.  Will try to improve compliance with a pen which he is intersted in.  I will try the Humalog 75/25 pen as this appears covered by his insurance.  I will also increase to 12 u BID based on his reported numbers.  He will also start checking his sugars more often and I have sent diabetic testing supplies to help with this. Home glucose monitoring: Frequency: 3 times a day Timing: before  meals Instruction/counseling given: reminded to bring blood glucose meter & log to each visit, reminded to bring medications to each visit and discussed diet Educational resources provided:   Self management tools provided:   Other plans: has follow up appointment scheduled for late may.  I cautioned patient if he become hypoglycemic to reduce dose back to 10 units BID.     HTN (hypertension) BP Readings from Last 3 Encounters:  06/02/14 140/79  05/24/14 176/86  05/24/14 188/95    Lab Results  Component Value Date   NA 139 05/24/2014   K 4.9 05/24/2014   CREATININE 6.85* 05/24/2014    Assessment: Blood pressure control: controlled Progress toward BP goal:  improved Comments: taking medication  Plan: Medications:  Continue Labetalol 100mg  BID, will increase lasix to 40mg  daily Educational resources provided:   Self management tools provided:   Other plans: I doubt 20mg  of lasix will be very effective with his renal disease and will try 40mg  this may help some with his BP.  Will also repeat a BMP today.  He has follow up with nephrology in 2 weeks.      Medications Ordered Meds ordered this encounter  Medications  . pravastatin (PRAVACHOL) 40 MG tablet    Sig: Take 1 tablet (40 mg total) by mouth daily.    Dispense:  30 tablet    Refill:  11  . labetalol (NORMODYNE) 100 MG tablet    Sig: Take 1 tablet (100 mg total) by mouth 2 (two) times daily.    Dispense:  60 tablet    Refill:  5  . furosemide (LASIX) 40 MG tablet    Sig: Take 1 tablet (40 mg total) by mouth daily.    Dispense:  30 tablet    Refill:  5  . Insulin Lispro Prot & Lispro (HUMALOG MIX 75/25 KWIKPEN) (75-25) 100 UNIT/ML Kwikpen    Sig: Inject 12 Units into the skin 2 (two) times daily.    Dispense:  15 mL    Refill:  5  . Insulin Pen Needle 31G X 5 MM MISC    Sig: 1 Units by Does not apply route as needed.    Dispense:  100 each    Refill:  11    The patient is insulin requiring, ICD 10 code  E11.65.  Marland Kitchen. glucose blood (RELION PRIME TEST) test strip    Sig: Use as instructed    Dispense:  100 each    Refill:  12  . Blood Glucose Monitoring Suppl (RELION PRIME MONITOR) DEVI    Sig: 1 Units by Does not apply route 3 (three) times daily before meals.    Dispense:  100 Device    Refill:  11    The patient is insulin requiring, ICD 10 code E11.65. The patient tests 3 times per day.  Marland Kitchen. RELION LANCETS THIN 26G MISC    Sig: 1 Units by Does not apply route 3 (three) times daily before meals.    Dispense:  100 each    Refill:  11   Other Orders Orders Placed This Encounter  Procedures  . BMP with Estimated GFR ([id number])

## 2014-06-15 ENCOUNTER — Other Ambulatory Visit (HOSPITAL_COMMUNITY): Payer: Self-pay | Admitting: *Deleted

## 2014-06-16 ENCOUNTER — Encounter (HOSPITAL_COMMUNITY)
Admission: RE | Admit: 2014-06-16 | Discharge: 2014-06-16 | Disposition: A | Discharge status: Home / Self Care | Payer: 59 | Source: Ambulatory Visit | Attending: Nephrology | Admitting: Nephrology

## 2014-06-16 DIAGNOSIS — N184 Chronic kidney disease, stage 4 (severe): Secondary | ICD-10-CM | POA: Diagnosis not present

## 2014-06-16 DIAGNOSIS — D631 Anemia in chronic kidney disease: Secondary | ICD-10-CM | POA: Insufficient documentation

## 2014-06-16 MED ORDER — SODIUM CHLORIDE 0.9 % IV SOLN
510.0000 mg | INTRAVENOUS | Status: DC
  Administered 2014-06-16: 510 mg via INTRAVENOUS
  Filled 2014-06-16: qty 17

## 2014-06-23 ENCOUNTER — Encounter (HOSPITAL_COMMUNITY)
Admission: RE | Admit: 2014-06-23 | Discharge: 2014-06-23 | Disposition: A | Discharge status: Home / Self Care | Payer: 59 | Source: Ambulatory Visit | Attending: Nephrology | Admitting: Nephrology

## 2014-06-23 DIAGNOSIS — D631 Anemia in chronic kidney disease: Secondary | ICD-10-CM | POA: Diagnosis not present

## 2014-06-23 LAB — RENAL FUNCTION PANEL
ANION GAP: 14 (ref 5–15)
Albumin: 3.5 g/dL (ref 3.5–5.2)
BUN: 90 mg/dL — AB (ref 6–23)
CO2: 18 mmol/L — AB (ref 19–32)
Calcium: 8.3 mg/dL — ABNORMAL LOW (ref 8.4–10.5)
Chloride: 109 mmol/L (ref 96–112)
Creatinine, Ser: 8.16 mg/dL — ABNORMAL HIGH (ref 0.50–1.35)
GFR calc Af Amer: 8 mL/min — ABNORMAL LOW (ref >90)
GFR calc non Af Amer: 7 mL/min — ABNORMAL LOW (ref >90)
Glucose, Bld: 124 mg/dL — ABNORMAL HIGH (ref 70–99)
Phosphorus: 7.3 mg/dL — ABNORMAL HIGH (ref 2.3–4.6)
Potassium: 5.4 mmol/L — ABNORMAL HIGH (ref 3.5–5.1)
SODIUM: 141 mmol/L (ref 135–145)

## 2014-06-23 LAB — POCT HEMOGLOBIN-HEMACUE: Hemoglobin: 8.1 g/dL — ABNORMAL LOW (ref 13.0–17.0)

## 2014-06-23 LAB — IRON AND TIBC
IRON: 53 ug/dL (ref 42–165)
Saturation Ratios: 20 % (ref 20–55)
TIBC: 271 ug/dL (ref 215–435)
UIBC: 218 ug/dL (ref 125–400)

## 2014-06-23 LAB — FERRITIN: FERRITIN: 459 ng/mL — AB (ref 22–322)

## 2014-06-23 MED ORDER — SODIUM CHLORIDE 0.9 % IV SOLN
510.0000 mg | INTRAVENOUS | Status: DC
  Administered 2014-06-23: 510 mg via INTRAVENOUS
  Filled 2014-06-23: qty 17

## 2014-06-23 MED ORDER — EPOETIN ALFA 20000 UNIT/ML IJ SOLN
20000.0000 [IU] | INTRAMUSCULAR | Status: DC
  Administered 2014-06-23: 20000 [IU] via SUBCUTANEOUS

## 2014-06-23 MED ORDER — EPOETIN ALFA 20000 UNIT/ML IJ SOLN
INTRAMUSCULAR | Status: AC
  Filled 2014-06-23: qty 1

## 2014-06-24 LAB — PTH, INTACT AND CALCIUM
Calcium, Total (PTH): 8.2 mg/dL — ABNORMAL LOW (ref 8.7–10.2)
PTH: 307 pg/mL — AB (ref 15–65)

## 2014-07-12 ENCOUNTER — Telehealth: Payer: Self-pay | Admitting: Internal Medicine

## 2014-07-12 NOTE — Telephone Encounter (Signed)
Call to patient to confirm appointment for 07/13/14 at 3:45 lmtcb

## 2014-07-13 ENCOUNTER — Encounter: Payer: Self-pay | Admitting: Internal Medicine

## 2014-07-16 ENCOUNTER — Encounter: Payer: Self-pay | Admitting: *Deleted

## 2014-07-20 ENCOUNTER — Other Ambulatory Visit (HOSPITAL_COMMUNITY): Payer: Self-pay | Admitting: *Deleted

## 2014-07-21 ENCOUNTER — Encounter (HOSPITAL_COMMUNITY): Payer: 59

## 2014-08-01 ENCOUNTER — Encounter (HOSPITAL_COMMUNITY)
Admission: RE | Admit: 2014-08-01 | Discharge: 2014-08-01 | Disposition: A | Discharge status: Home / Self Care | Payer: 59 | Source: Ambulatory Visit | Attending: Nephrology | Admitting: Nephrology

## 2014-08-01 DIAGNOSIS — D631 Anemia in chronic kidney disease: Secondary | ICD-10-CM | POA: Insufficient documentation

## 2014-08-01 DIAGNOSIS — N184 Chronic kidney disease, stage 4 (severe): Secondary | ICD-10-CM | POA: Diagnosis not present

## 2014-08-01 LAB — RENAL FUNCTION PANEL
ANION GAP: 17 — AB (ref 5–15)
Albumin: 3.4 g/dL — ABNORMAL LOW (ref 3.5–5.0)
BUN: 103 mg/dL — ABNORMAL HIGH (ref 6–20)
CALCIUM: 8.7 mg/dL — AB (ref 8.9–10.3)
CHLORIDE: 103 mmol/L (ref 101–111)
CO2: 16 mmol/L — ABNORMAL LOW (ref 22–32)
CREATININE: 8.52 mg/dL — AB (ref 0.61–1.24)
GFR calc non Af Amer: 7 mL/min — ABNORMAL LOW (ref >60)
GFR, EST AFRICAN AMERICAN: 8 mL/min — AB (ref >60)
Glucose, Bld: 114 mg/dL — ABNORMAL HIGH (ref 65–99)
POTASSIUM: 4.1 mmol/L (ref 3.5–5.1)
Phosphorus: 7.4 mg/dL — ABNORMAL HIGH (ref 2.5–4.6)
SODIUM: 136 mmol/L (ref 135–145)

## 2014-08-01 LAB — IRON AND TIBC
Iron: 38 ug/dL — ABNORMAL LOW (ref 45–182)
Saturation Ratios: 14 % — ABNORMAL LOW (ref 17.9–39.5)
TIBC: 269 ug/dL (ref 250–450)
UIBC: 231 ug/dL

## 2014-08-01 LAB — POCT HEMOGLOBIN-HEMACUE: Hemoglobin: 9.2 g/dL — ABNORMAL LOW (ref 13.0–17.0)

## 2014-08-01 LAB — FERRITIN: Ferritin: 343 ng/mL — ABNORMAL HIGH (ref 24–336)

## 2014-08-01 MED ORDER — EPOETIN ALFA 20000 UNIT/ML IJ SOLN
20000.0000 [IU] | INTRAMUSCULAR | Status: DC
  Administered 2014-08-01: 20000 [IU] via SUBCUTANEOUS

## 2014-08-01 MED ORDER — EPOETIN ALFA 20000 UNIT/ML IJ SOLN
INTRAMUSCULAR | Status: AC
  Filled 2014-08-01: qty 1

## 2014-08-02 LAB — PTH, INTACT AND CALCIUM
Calcium, Total (PTH): 8.7 mg/dL (ref 8.7–10.2)
PTH: 400 pg/mL — ABNORMAL HIGH (ref 15–65)

## 2014-08-07 ENCOUNTER — Other Ambulatory Visit (HOSPITAL_COMMUNITY): Payer: 59

## 2014-08-07 ENCOUNTER — Encounter (HOSPITAL_COMMUNITY): Payer: 59

## 2014-08-07 ENCOUNTER — Ambulatory Visit: Payer: 59 | Admitting: Surgery

## 2014-08-08 ENCOUNTER — Other Ambulatory Visit: Payer: Self-pay | Admitting: *Deleted

## 2014-08-08 ENCOUNTER — Encounter: Payer: Self-pay | Admitting: Surgery

## 2014-08-08 DIAGNOSIS — Z01810 Encounter for preprocedural cardiovascular examination: Secondary | ICD-10-CM

## 2014-08-08 DIAGNOSIS — I120 Hypertensive chronic kidney disease with stage 5 chronic kidney disease or end stage renal disease: Secondary | ICD-10-CM

## 2014-08-11 ENCOUNTER — Ambulatory Visit (INDEPENDENT_AMBULATORY_CARE_PROVIDER_SITE_OTHER): Payer: 59 | Admitting: Surgery

## 2014-08-11 ENCOUNTER — Ambulatory Visit (INDEPENDENT_AMBULATORY_CARE_PROVIDER_SITE_OTHER)
Admission: RE | Admit: 2014-08-11 | Discharge: 2014-08-11 | Disposition: A | Discharge status: Home / Self Care | Payer: 59 | Source: Ambulatory Visit | Attending: Surgery | Admitting: Surgery

## 2014-08-11 ENCOUNTER — Ambulatory Visit (HOSPITAL_COMMUNITY)
Admission: RE | Admit: 2014-08-11 | Discharge: 2014-08-11 | Disposition: A | Discharge status: Home / Self Care | Payer: 59 | Source: Ambulatory Visit | Attending: Surgery | Admitting: Surgery

## 2014-08-11 ENCOUNTER — Other Ambulatory Visit: Payer: Self-pay

## 2014-08-11 ENCOUNTER — Encounter: Payer: Self-pay | Admitting: Surgery

## 2014-08-11 VITALS — BP 165/108 | HR 69 | Resp 18 | Ht 72.5 in | Wt 290.0 lb

## 2014-08-11 DIAGNOSIS — N183 Chronic kidney disease, stage 3 (moderate): Secondary | ICD-10-CM

## 2014-08-11 DIAGNOSIS — Z01810 Encounter for preprocedural cardiovascular examination: Secondary | ICD-10-CM | POA: Diagnosis present

## 2014-08-11 DIAGNOSIS — N19 Unspecified kidney failure: Secondary | ICD-10-CM

## 2014-08-11 DIAGNOSIS — N189 Chronic kidney disease, unspecified: Secondary | ICD-10-CM

## 2014-08-11 DIAGNOSIS — I120 Hypertensive chronic kidney disease with stage 5 chronic kidney disease or end stage renal disease: Secondary | ICD-10-CM | POA: Insufficient documentation

## 2014-08-11 DIAGNOSIS — N185 Chronic kidney disease, stage 5: Secondary | ICD-10-CM | POA: Insufficient documentation

## 2014-08-11 DIAGNOSIS — N184 Chronic kidney disease, stage 4 (severe): Secondary | ICD-10-CM

## 2014-08-11 DIAGNOSIS — N181 Chronic kidney disease, stage 1: Secondary | ICD-10-CM

## 2014-08-11 DIAGNOSIS — N182 Chronic kidney disease, stage 2 (mild): Secondary | ICD-10-CM

## 2014-08-11 NOTE — Progress Notes (Signed)
HISTORY AND PHYSICAL     CC:  In need of permanent HD access Referring Provider:  Genelle Gather, MD  HPI: This is a 44 y.o. male who has a longstanding hx of diabetes (> 20 years) with hx of left below knee amputation in 2013 due to non healing ulcers as a result of his diabetes.    In 2015, he was hospitalized with a hypertensive emergency with acute on chronic renal failure with a creatinine in the 3's.  His creatinine in the past week was 8.  He is referred here by Dr. Kathrene Bongo.  He states that he is not yet on HD yet and does not have an idea of when he might be going on HD.  He states that Dr. Kathrene Bongo has discussed different options with him for dialysis including HD at home or peritoneal dialysis. According to Dr. Jon Gills note, he was referred for evaluation for transplant, but did not make it to the appointment.    He is on insulin for his diabetes.  He is on a statin for his cholesterol.  He is on a beta blocker for his HTN.    He currently uses smokeless tobacco (snuff) and has for 20 years.  He drinks 1-3 cans of beer/week and 1-3 shots of liquor/week.  He continues to work full time.  The pt is right hand dominant.  Past Medical History  Diagnosis Date  . Acid reflux   . Diabetic foot ulcer 06/2011; 10/2011-12/2011    left dorsal foot  (01/03/2012)  . Osteomyelitis of left foot   . Type II diabetes mellitus   . Hypertension   . Chronic kidney disease     CKD STAGE 4    Past Surgical History  Procedure Laterality Date  . Toe amputation  2009; 2012?    left great; left 2nd toe  . Amputation  01/06/2012    Procedure: AMPUTATION BELOW KNEE;  Surgeon: Toni Arthurs, MD;  Location: [facility];  Service: Orthopedics;  Laterality: Left;  DR.HEWITT WOULD LIKE TO FOLLOW HIS OTHER AFTERNOON CASE STARTING AROUND 1700-1715    Allergies  Allergen Reactions  . Norvasc [Amlodipine Besylate]     Leg swelling     Current Outpatient Prescriptions  Medication Sig  Dispense Refill  . Blood Glucose Monitoring Suppl (RELION PRIME MONITOR) DEVI 1 Units by Does not apply route 3 (three) times daily before meals. 100 Device 11  . furosemide (LASIX) 40 MG tablet Take 1 tablet (40 mg total) by mouth daily. 30 tablet 5  . glucose blood (RELION PRIME TEST) test strip Use as instructed 100 each 12  . Insulin Lispro Prot & Lispro (HUMALOG MIX 75/25 KWIKPEN) (75-25) 100 UNIT/ML Kwikpen Inject 12 Units into the skin 2 (two) times daily. 15 mL 5  . Insulin Pen Needle 31G X 5 MM MISC 1 Units by Does not apply route as needed. 100 each 11  . labetalol (NORMODYNE) 100 MG tablet Take 1 tablet (100 mg total) by mouth 2 (two) times daily. 60 tablet 5  . pravastatin (PRAVACHOL) 40 MG tablet Take 1 tablet (40 mg total) by mouth daily. 30 tablet 11  . RELION LANCETS THIN 26G MISC 1 Units by Does not apply route 3 (three) times daily before meals. 100 each 11   No current facility-administered medications for this visit.    History reviewed. No pertinent family history.  History   Social History  . Marital Status: Legally Separated    Spouse Name: N/A  .  Number of Children: N/A  . Years of Education: N/A   Occupational History  . Not on file.   Social History Main Topics  . Smoking status: Light Tobacco Smoker -- 0.10 packs/day for 7 years    Types: Cigars  . Smokeless tobacco: Current User    Types: Snuff     Comment: 01/03/2012 "quit cigarettes 15-20 yr ago; occasionally smoke a pipe; dip rarely - last time ~ 3 months ago"  07/2013 "occasional cigar"  . Alcohol Use: 1.2 oz/week    2 Cans of beer per week     Comment: 01/03/2012 "couple beers once/wk"  . Drug Use: No  . Sexual Activity: Yes   Other Topics Concern  . Not on file   Social History Narrative     ROS: [x]  Positive   [ ]  Negative   [ ]  All sytems reviewed and are negative  Cardiovascular: []  chest pain/pressure []  palpitations []  SOB lying flat []  DOE []  pain in legs while walking []  pain  in feet when lying flat []  hx of DVT []  hx of phlebitis [x]  swelling in legs []  varicose veins  Pulmonary: []  productive cough []  asthma []  wheezing  Neurologic: []  weakness in []  arms []  legs []  numbness in []  arms []  legs [] difficulty speaking or slurred speech []  temporary loss of vision in one eye []  dizziness  Hematologic: []  bleeding problems []  problems with blood clotting easily  GI []  vomiting blood []  blood in stool  GU: []  burning with urination []  blood in urine  Psychiatric: []  hx of major depression  Integumentary: []  rashes []  ulcers  Constitutional: []  fever []  chills   PHYSICAL EXAMINATION:  Filed Vitals:   08/11/14 1359  BP: 165/108  Pulse: 69  Resp:    Body mass index is 38.77 kg/(m^2).  General:  WDWN obese in NAD Gait: Not observed HENT: WNL, normocephalic Pulmonary: normal non-labored breathing , without Rales, rhonchi,  wheezing Cardiac: RRR, without  Murmurs, rubs or gallops; without carotid bruits Abdomen: obese Skin: without rashes, without ulcers  Vascular Exam/Pulses:  Right Left  Radial 2+ (normal) 2+ (normal)  Ulnar 2+ (normal) 2+ (normal)  DP Unable to palpate  BKA  PT Unable to palpate  BKA   Extremities: without ischemic changes, without Gangrene , without cellulitis; without open wounds; left leg with prosthetic  Musculoskeletal: no muscle wasting or atrophy  Neurologic: A&O X 3; Appropriate Affect ; SENSATION: normal; MOTOR FUNCTION:  moving all extremities equally. Speech is fluent/normal   Non-Invasive Vascular Imaging:   Bilateral upper extremity vein mapping 08/11/14: Right Cephalic measures:  0.31-0.37cm Right Basilic measures:  0.44-0.66cm  Left Cephalic measures:  0.37-0.46cm Left Basilic measures:  0.33-0.61cm  Upper extremity arterial duplex evaluation 08/11/14: -Patent bilateral upper extremity arteries with no evidence of hemodynamically significant stenosis noted and waveforms are triphasic    -bilateral brachial artery bifurcations are noted at the antecubital fossa   Pt meds includes: Statin:  Yes.   Beta Blocker:  Yes.   Aspirin:  No. ACEI:  No. ARB:  No. Other Antiplatelet/Anticoagulant:  No.    ASSESSMENT/PLAN:: 44 y.o. male with CKD5 in need of permanent HD access placement   -pt is not yet on HD and is right hand dominant.   -his vein mapping reveals satisfactory vein for a left radial cephalic AVF, which will be scheduled for Dr. Edilia Bo on Tuesday, August 15, 2014 -risks were discussed with the pt that included additional procedures in the future as well  as steal syndrome and the symptoms of this.   -pt understands and wishes to proceed.   Doreatha Massed, PA-C [facility] [phone]  Clinic MD:  Pt seen and examined in conjunction with Dr. Myra Gianotti  I agree with the above.  I have seen and evaluated the patient.  He is right handed.  I think he would be a great candidate for a left radiocephalic fistula.  We discussed the risks and benefits of the operation including the need for future procedures, the risk of steal syndrome, and fistula nonfunction.  He has been scheduled for Tuesday, June 21  Durene Cal

## 2014-08-11 NOTE — Progress Notes (Signed)
Filed Vitals:   08/11/14 1355 08/11/14 1359  BP: 166/101 165/108  Pulse: 71 69  Resp: 18   Height: 6' 0.5" (1.842 m)   Weight: 290 lb (131.543 kg)   Body mass index is 38.77 kg/(m^2).

## 2014-08-14 ENCOUNTER — Encounter (HOSPITAL_COMMUNITY): Payer: Self-pay | Admitting: *Deleted

## 2014-08-14 MED ORDER — SODIUM CHLORIDE 0.9 % IV SOLN
INTRAVENOUS | Status: DC
Start: 2014-08-15 — End: 2014-08-15
  Administered 2014-08-15 (×3): via INTRAVENOUS

## 2014-08-14 MED ORDER — DEXTROSE 5 % IV SOLN
1.5000 g | INTRAVENOUS | Status: AC
  Administered 2014-08-15: 1.5 g via INTRAVENOUS
  Filled 2014-08-14: qty 1.5

## 2014-08-14 NOTE — Progress Notes (Signed)
   08/14/14 1543  OBSTRUCTIVE SLEEP APNEA  Have you ever been diagnosed with sleep apnea through a sleep study? No  Do you snore loudly (loud enough to be heard through closed doors)?  1  Do you often feel tired, fatigued, or sleepy during the daytime? 1  Has anyone observed you stop breathing during your sleep? 0  Do you have, or are you being treated for high blood pressure? 1  BMI more than 35 kg/m2? 1  Age over 44 years old? 0  Gender: 1

## 2014-08-14 NOTE — Progress Notes (Signed)
Pt denies SOB, chest pain, and being under the care of a cardiologist. Pt denies having a stress test, echo and cardiac cath. Pt made aware to not take any diabetic medications the morning of procedure. Pt made aware to stop otc vitamins, herbal medications and NSAID's. Pt verbalized understanding of all pre-op instructions.

## 2014-08-15 ENCOUNTER — Encounter (HOSPITAL_COMMUNITY): Payer: Self-pay | Admitting: *Deleted

## 2014-08-15 ENCOUNTER — Other Ambulatory Visit: Payer: 59 | Admitting: *Deleted

## 2014-08-15 ENCOUNTER — Ambulatory Visit (HOSPITAL_COMMUNITY): Payer: 59 | Admitting: Certified Registered"

## 2014-08-15 ENCOUNTER — Encounter (HOSPITAL_COMMUNITY)
Admission: RE | Disposition: A | Discharge status: Home / Self Care | Payer: Self-pay | Source: Ambulatory Visit | Attending: Vascular Surgery

## 2014-08-15 ENCOUNTER — Ambulatory Visit (HOSPITAL_COMMUNITY): Payer: 59

## 2014-08-15 ENCOUNTER — Other Ambulatory Visit: Payer: Self-pay | Admitting: *Deleted

## 2014-08-15 ENCOUNTER — Ambulatory Visit (HOSPITAL_COMMUNITY)
Admission: RE | Admit: 2014-08-15 | Discharge: 2014-08-15 | Disposition: A | Discharge status: Home / Self Care | Payer: 59 | Source: Ambulatory Visit | Attending: Vascular Surgery | Admitting: Vascular Surgery

## 2014-08-15 DIAGNOSIS — E119 Type 2 diabetes mellitus without complications: Secondary | ICD-10-CM | POA: Diagnosis not present

## 2014-08-15 DIAGNOSIS — I120 Hypertensive chronic kidney disease with stage 5 chronic kidney disease or end stage renal disease: Secondary | ICD-10-CM | POA: Diagnosis present

## 2014-08-15 DIAGNOSIS — F17210 Nicotine dependence, cigarettes, uncomplicated: Secondary | ICD-10-CM | POA: Diagnosis not present

## 2014-08-15 DIAGNOSIS — N185 Chronic kidney disease, stage 5: Secondary | ICD-10-CM | POA: Insufficient documentation

## 2014-08-15 DIAGNOSIS — N186 End stage renal disease: Secondary | ICD-10-CM

## 2014-08-15 DIAGNOSIS — K219 Gastro-esophageal reflux disease without esophagitis: Secondary | ICD-10-CM | POA: Insufficient documentation

## 2014-08-15 DIAGNOSIS — Z95828 Presence of other vascular implants and grafts: Secondary | ICD-10-CM

## 2014-08-15 DIAGNOSIS — Z794 Long term (current) use of insulin: Secondary | ICD-10-CM | POA: Diagnosis not present

## 2014-08-15 DIAGNOSIS — Z4931 Encounter for adequacy testing for hemodialysis: Secondary | ICD-10-CM

## 2014-08-15 DIAGNOSIS — Z992 Dependence on renal dialysis: Secondary | ICD-10-CM

## 2014-08-15 HISTORY — DX: Other reaction to spinal and lumbar puncture: G97.1

## 2014-08-15 HISTORY — PX: AV FISTULA PLACEMENT: SHX1204

## 2014-08-15 HISTORY — PX: INSERTION OF DIALYSIS CATHETER: SHX1324

## 2014-08-15 HISTORY — DX: Anemia, unspecified: D64.9

## 2014-08-15 LAB — POCT I-STAT 4, (NA,K, GLUC, HGB,HCT)
GLUCOSE: 99 mg/dL (ref 65–99)
HEMATOCRIT: 29 % — AB (ref 39.0–52.0)
Hemoglobin: 9.9 g/dL — ABNORMAL LOW (ref 13.0–17.0)
Potassium: 4.5 mmol/L (ref 3.5–5.1)
SODIUM: 138 mmol/L (ref 135–145)

## 2014-08-15 LAB — GLUCOSE, CAPILLARY
GLUCOSE-CAPILLARY: 95 mg/dL (ref 65–99)
Glucose-Capillary: 108 mg/dL — ABNORMAL HIGH (ref 65–99)
Glucose-Capillary: 84 mg/dL (ref 65–99)

## 2014-08-15 SURGERY — ARTERIOVENOUS (AV) FISTULA CREATION
Anesthesia: Monitor Anesthesia Care | Site: Neck

## 2014-08-15 MED ORDER — HYDROMORPHONE HCL 1 MG/ML IJ SOLN: 0.5000 mg | INTRAMUSCULAR | Status: DC | PRN

## 2014-08-15 MED ORDER — EPHEDRINE SULFATE 50 MG/ML IJ SOLN
INTRAMUSCULAR | Status: DC | PRN
  Administered 2014-08-15 (×3): 10 mg via INTRAVENOUS

## 2014-08-15 MED ORDER — PROPOFOL 10 MG/ML IV BOLUS
INTRAVENOUS | Status: DC | PRN
  Administered 2014-08-15: 200 mg via INTRAVENOUS
  Administered 2014-08-15 (×2): 50 mg via INTRAVENOUS

## 2014-08-15 MED ORDER — LIDOCAINE HCL (CARDIAC) 20 MG/ML IV SOLN
INTRAVENOUS | Status: DC | PRN
  Administered 2014-08-15: 50 mg via INTRAVENOUS

## 2014-08-15 MED ORDER — FENTANYL CITRATE (PF) 250 MCG/5ML IJ SOLN
INTRAMUSCULAR | Status: AC
  Filled 2014-08-15: qty 5

## 2014-08-15 MED ORDER — HEPARIN SODIUM (PORCINE) 1000 UNIT/ML IJ SOLN
INTRAMUSCULAR | Status: DC | PRN
  Administered 2014-08-15: 7000 [IU] via INTRAVENOUS

## 2014-08-15 MED ORDER — LIDOCAINE HCL (CARDIAC) 20 MG/ML IV SOLN
INTRAVENOUS | Status: AC
  Filled 2014-08-15: qty 5

## 2014-08-15 MED ORDER — 0.9 % SODIUM CHLORIDE (POUR BTL) OPTIME
TOPICAL | Status: DC | PRN
  Administered 2014-08-15: 1000 mL

## 2014-08-15 MED ORDER — LIDOCAINE-EPINEPHRINE (PF) 1 %-1:200000 IJ SOLN
INTRAMUSCULAR | Status: AC
  Filled 2014-08-15: qty 10

## 2014-08-15 MED ORDER — PROPOFOL 10 MG/ML IV BOLUS
INTRAVENOUS | Status: AC
  Filled 2014-08-15: qty 20

## 2014-08-15 MED ORDER — HYDROCODONE-ACETAMINOPHEN 5-325 MG PO TABS
1.0000 | ORAL_TABLET | Freq: Four times a day (QID) | ORAL | Status: DC | PRN
Start: 2014-08-15 — End: 2016-03-29

## 2014-08-15 MED ORDER — ONDANSETRON HCL 4 MG/2ML IJ SOLN: 4.0000 mg | Freq: Once | INTRAMUSCULAR | Status: DC | PRN

## 2014-08-15 MED ORDER — HEPARIN SODIUM (PORCINE) 1000 UNIT/ML IJ SOLN
INTRAMUSCULAR | Status: DC | PRN
  Administered 2014-08-15: 4.6 mL

## 2014-08-15 MED ORDER — PHENYLEPHRINE HCL 10 MG/ML IJ SOLN
INTRAMUSCULAR | Status: DC | PRN
  Administered 2014-08-15 (×3): 80 ug via INTRAVENOUS

## 2014-08-15 MED ORDER — FENTANYL CITRATE (PF) 100 MCG/2ML IJ SOLN
INTRAMUSCULAR | Status: DC | PRN
Start: 2014-08-15 — End: 2014-08-15
  Administered 2014-08-15: 25 ug via INTRAVENOUS
  Administered 2014-08-15: 100 ug via INTRAVENOUS
  Administered 2014-08-15: 25 ug via INTRAVENOUS

## 2014-08-15 MED ORDER — HEPARIN SODIUM (PORCINE) 1000 UNIT/ML IJ SOLN
INTRAMUSCULAR | Status: AC
  Filled 2014-08-15: qty 1

## 2014-08-15 MED ORDER — ONDANSETRON HCL 4 MG/2ML IJ SOLN
INTRAMUSCULAR | Status: DC | PRN
  Administered 2014-08-15: 4 mg via INTRAVENOUS

## 2014-08-15 MED ORDER — ALBUMIN HUMAN 5 % IV SOLN
INTRAVENOUS | Status: DC | PRN
  Administered 2014-08-15: 11:00:00 via INTRAVENOUS

## 2014-08-15 MED ORDER — THROMBIN 20000 UNITS EX SOLR
CUTANEOUS | Status: AC
  Filled 2014-08-15: qty 20000

## 2014-08-15 MED ORDER — LIDOCAINE HCL (PF) 1 % IJ SOLN
INTRAMUSCULAR | Status: DC | PRN
  Administered 2014-08-15: 30 mL

## 2014-08-15 MED ORDER — LIDOCAINE HCL (PF) 1 % IJ SOLN
INTRAMUSCULAR | Status: AC
  Filled 2014-08-15: qty 30

## 2014-08-15 MED ORDER — OXYCODONE-ACETAMINOPHEN 5-325 MG PO TABS: 1.0000 | ORAL_TABLET | Freq: Four times a day (QID) | ORAL | Status: DC | PRN

## 2014-08-15 MED ORDER — PROTAMINE SULFATE 10 MG/ML IV SOLN
INTRAVENOUS | Status: DC | PRN
  Administered 2014-08-15: 40 mg via INTRAVENOUS

## 2014-08-15 MED ORDER — SODIUM CHLORIDE 0.9 % IR SOLN
Status: DC | PRN
  Administered 2014-08-15: 500 mL

## 2014-08-15 MED ORDER — SODIUM CHLORIDE 0.9 % IV SOLN: INTRAVENOUS | Status: DC

## 2014-08-15 MED ORDER — CHLORHEXIDINE GLUCONATE CLOTH 2 % EX PADS: 6.0000 | MEDICATED_PAD | Freq: Once | CUTANEOUS | Status: DC

## 2014-08-15 SURGICAL SUPPLY — 56 items
ARMBAND PINK RESTRICT EXTREMIT (MISCELLANEOUS) ×4 IMPLANT
BAG DECANTER FOR FLEXI CONT (MISCELLANEOUS) ×4 IMPLANT
BIOPATCH RED 1 DISK 7.0 (GAUZE/BANDAGES/DRESSINGS) ×3 IMPLANT
BIOPATCH RED 1IN DISK 7.0MM (GAUZE/BANDAGES/DRESSINGS) ×1
CANISTER SUCTION 2500CC (MISCELLANEOUS) ×4 IMPLANT
CANNULA VESSEL 3MM 2 BLNT TIP (CANNULA) ×4 IMPLANT
CATH CANNON HEMO 15F 50CM (CATHETERS) IMPLANT
CATH CANNON HEMO 15FR 19 (HEMODIALYSIS SUPPLIES) IMPLANT
CATH CANNON HEMO 15FR 23CM (HEMODIALYSIS SUPPLIES) ×4 IMPLANT
CATH CANNON HEMO 15FR 31CM (HEMODIALYSIS SUPPLIES) IMPLANT
CATH CANNON HEMO 15FR 32CM (HEMODIALYSIS SUPPLIES) IMPLANT
CHLORAPREP W/TINT 26ML (MISCELLANEOUS) ×4 IMPLANT
CLIP TI MEDIUM 6 (CLIP) ×4 IMPLANT
CLIP TI WIDE RED SMALL 6 (CLIP) ×8 IMPLANT
COVER PROBE W GEL 5X96 (DRAPES) ×8 IMPLANT
DECANTER SPIKE VIAL GLASS SM (MISCELLANEOUS) ×4 IMPLANT
DRAPE C-ARM 42X72 X-RAY (DRAPES) ×4 IMPLANT
DRAPE CHEST BREAST 15X10 FENES (DRAPES) ×4 IMPLANT
ELECT REM PT RETURN 9FT ADLT (ELECTROSURGICAL) ×4
ELECTRODE REM PT RTRN 9FT ADLT (ELECTROSURGICAL) ×2 IMPLANT
GAUZE SPONGE 2X2 8PLY STRL LF (GAUZE/BANDAGES/DRESSINGS) ×2 IMPLANT
GAUZE SPONGE 4X4 16PLY XRAY LF (GAUZE/BANDAGES/DRESSINGS) ×4 IMPLANT
GLOVE BIO SURGEON STRL SZ 6.5 (GLOVE) ×3 IMPLANT
GLOVE BIO SURGEON STRL SZ7 (GLOVE) ×4 IMPLANT
GLOVE BIO SURGEON STRL SZ7.5 (GLOVE) ×4 IMPLANT
GLOVE BIO SURGEONS STRL SZ 6.5 (GLOVE) ×1
GLOVE BIOGEL PI IND STRL 6.5 (GLOVE) ×4 IMPLANT
GLOVE BIOGEL PI IND STRL 8 (GLOVE) ×2 IMPLANT
GLOVE BIOGEL PI INDICATOR 6.5 (GLOVE) ×4
GLOVE BIOGEL PI INDICATOR 8 (GLOVE) ×2
GOWN STRL REUS W/ TWL LRG LVL3 (GOWN DISPOSABLE) ×6 IMPLANT
GOWN STRL REUS W/TWL LRG LVL3 (GOWN DISPOSABLE) ×6
KIT BASIN OR (CUSTOM PROCEDURE TRAY) ×4 IMPLANT
KIT ROOM TURNOVER OR (KITS) ×4 IMPLANT
LIQUID BAND (GAUZE/BANDAGES/DRESSINGS) ×12 IMPLANT
NEEDLE 18GX1X1/2 (RX/OR ONLY) (NEEDLE) ×4 IMPLANT
NEEDLE 22X1 1/2 (OR ONLY) (NEEDLE) ×4 IMPLANT
NEEDLE HYPO 25GX1X1/2 BEV (NEEDLE) ×4 IMPLANT
NS IRRIG 1000ML POUR BTL (IV SOLUTION) ×4 IMPLANT
PACK CV ACCESS (CUSTOM PROCEDURE TRAY) ×4 IMPLANT
PACK SURGICAL SETUP 50X90 (CUSTOM PROCEDURE TRAY) ×4 IMPLANT
PAD ARMBOARD 7.5X6 YLW CONV (MISCELLANEOUS) ×8 IMPLANT
SPONGE GAUZE 2X2 STER 10/PKG (GAUZE/BANDAGES/DRESSINGS) ×2
SPONGE SURGIFOAM ABS GEL 100 (HEMOSTASIS) IMPLANT
SUT ETHILON 3 0 PS 1 (SUTURE) ×4 IMPLANT
SUT PROLENE 6 0 BV (SUTURE) ×4 IMPLANT
SUT VIC AB 3-0 SH 27 (SUTURE) ×2
SUT VIC AB 3-0 SH 27X BRD (SUTURE) ×2 IMPLANT
SUT VICRYL 4-0 PS2 18IN ABS (SUTURE) ×12 IMPLANT
SYR 20CC LL (SYRINGE) ×8 IMPLANT
SYR 5ML LL (SYRINGE) ×8 IMPLANT
SYR CONTROL 10ML LL (SYRINGE) ×4 IMPLANT
SYRINGE 10CC LL (SYRINGE) ×4 IMPLANT
TAPE CLOTH SURG 4X10 WHT LF (GAUZE/BANDAGES/DRESSINGS) ×4 IMPLANT
UNDERPAD 30X30 INCONTINENT (UNDERPADS AND DIAPERS) ×4 IMPLANT
WATER STERILE IRR 1000ML POUR (IV SOLUTION) ×4 IMPLANT

## 2014-08-15 NOTE — Progress Notes (Signed)
Patient cannot take Oxycodone because it makes he very very nauseated. Kim PA was notified and a new Rx will be written. Patient stable and ready for discharge otherwise.

## 2014-08-15 NOTE — Op Note (Signed)
    NAME: Austin Mcdaniel    MRN: [id number] DOB: Aug 31, 1970    DATE OF OPERATION: 08/15/2014  PREOP DIAGNOSIS: Stage V chronic kidney disease  POSTOP DIAGNOSIS: Same  PROCEDURE:  1. Ultrasound-guided placement of 23 cm tunneled dialysis catheter 2. Pacing of left radial cephalic AV fistula  SURGEON: Di Kindle. Edilia Bo, MD, FACS  ASSIST: Karsten Ro, [facility]  ANESTHESIA: Gen.   EBL: minimal  INDICATIONS: Austin Mcdaniel is a 44 y.o. male who presents for new access. He is to begin dialysis soon.  FINDINGS: 3.5 mm left forearm cephalic vein  TECHNIQUE: The patient was taken to the operating room and received a general anesthetic. The neck and upper chest were prepped and draped in usual sterile fashion. With the patient in Trendelenburg, under ultrasound guidance, the right IJ was cannulated and a guidewire introduced into the superior vena cava under fluoroscopic control. The tract over the wire was dilated and then the dilator and peel-away sheath were passed over the wire.  Wire and peel-away sheath were removed. The catheter was passed through the peel-away sheath and positioned in the right atrium. Peel-away sheath was removed. The exit site of the catheter was selected and the catheter was brought to the tunnel cut the appropriate length and the distal ports were attached. Both ports withdrew easily within flushed with heparin saline and filled with concentrated heparin. The cath was secured at its exit site with a 3-0 nylon suture. The IJ cannulation site was closed with a 4-0 silk stitch. Sterile dressing was applied.  Attention was then turned to placement of a left radiocephalic AV fistula. By ultrasound the vein appeared to be adequate size. The vein was fairly far laterally therefore elected to make a separate incision over the vein and a separate incision over the radial artery. The radial artery was dissected free beneath the fascia. It was somewhat calcified but appeared to be a  reasonable inflow source. Cephalic vein was ligated distally and then brought through a tunnel for anastomosis to the radial artery. The patient was heparinized. Radial artery was clamped proximally and distally and a longitudinal arteriotomy was made. The vein was sewn end-to-side to the artery using continuous 60 proline suture. At the completion was a good thrill in the fistula and a good radial and ulnar signal with the Doppler. Hemostasis was obtained in the wounds. Each of the wounds was closed with a deep layer of 3-0 Vicryls. The skin was closed with 40 Vicryls. Sterile dressing was applied patient tolerated the procedure well and transferred to the recovery room in stable condition. All needle and sponge counts were correct.  Waverly Ferrari, MD, FACS [facility]  DATE OF DICTATION:   08/15/2014

## 2014-08-15 NOTE — Anesthesia Preprocedure Evaluation (Signed)
Anesthesia Evaluation  Patient identified by MRN, date of birth, ID band Patient awake    Reviewed: Allergy & Precautions, NPO status , Patient's Chart, lab work & pertinent test results  Airway Mallampati: I       Dental   Pulmonary Current Smoker,    Pulmonary exam normal       Cardiovascular hypertension, + Peripheral Vascular Disease Normal cardiovascular exam    Neuro/Psych  Headaches,    GI/Hepatic GERD-  ,  Endo/Other  diabetes, Type 1, Insulin Dependent  Renal/GU Dialysis, ESRF and CRFRenal disease     Musculoskeletal   Abdominal   Peds  Hematology   Anesthesia Other Findings   Reproductive/Obstetrics                             Anesthesia Physical Anesthesia Plan  ASA: III  Anesthesia Plan: MAC   Post-op Pain Management:    Induction: Intravenous  Airway Management Planned: Mask  Additional Equipment:   Intra-op Plan:   Post-operative Plan:   Informed Consent: I have reviewed the patients History and Physical, chart, labs and discussed the procedure including the risks, benefits and alternatives for the proposed anesthesia with the patient or authorized representative who has indicated his/her understanding and acceptance.     Plan Discussed with: CRNA, Anesthesiologist and Surgeon  Anesthesia Plan Comments:         Anesthesia Quick Evaluation

## 2014-08-15 NOTE — Anesthesia Procedure Notes (Signed)
Procedure Name: LMA Insertion Date/Time: 08/15/2014 10:38 AM Performed by: Gwenyth Allegra Pre-anesthesia Checklist: Emergency Drugs available, Patient identified, Timeout performed, Suction available and Patient being monitored Patient Re-evaluated:Patient Re-evaluated prior to inductionOxygen Delivery Method: Circle system utilized Preoxygenation: Pre-oxygenation with 100% oxygen Intubation Type: IV induction Ventilation: Mask ventilation without difficulty LMA: LMA inserted LMA Size: 5.0 Number of attempts: 1 Placement Confirmation: positive ETCO2 and breath sounds checked- equal and bilateral Tube secured with: Tape Dental Injury: Teeth and Oropharynx as per pre-operative assessment

## 2014-08-15 NOTE — Interval H&P Note (Signed)
History and Physical Interval Note:  08/15/2014 10:08 AM  Austin Mcdaniel  has presented today for surgery, with the diagnosis of Stage V Chronic Kidney Disease N18.5  The various methods of treatment have been discussed with the patient and family. After consideration of risks, benefits and other options for treatment, the patient has consented to  Procedure(s): RADIOCEPHALIC ARTERIOVENOUS (AV) FISTULA CREATION (Left) INSERTION OF DIALYSIS CATHETER (N/A) as a surgical intervention .  The patient's history has been reviewed, patient examined, no change in status, stable for surgery.  I have reviewed the patient's chart and labs.  Questions were answered to the patient's satisfaction.     Waverly Ferrari

## 2014-08-15 NOTE — Anesthesia Postprocedure Evaluation (Signed)
  Anesthesia Post-op Note  Patient: Austin Mcdaniel  Procedure(s) Performed: Procedure(s): RADIOCEPHALIC ARTERIOVENOUS Left FISTULA CREATION (Left) INSERTION OF DIALYSIS CATHETER (N/A)  Patient Location: PACU  Anesthesia Type:General  Level of Consciousness: awake, alert , oriented and patient cooperative  Airway and Oxygen Therapy: Patient Spontanous Breathing  Post-op Pain: mild  Post-op Assessment: Post-op Vital signs reviewed, Patient's Cardiovascular Status Stable, Respiratory Function Stable, Patent Airway, No signs of Nausea or vomiting and Pain level controlled              Post-op Vital Signs: stable  Last Vitals:  Filed Vitals:   08/15/14 1248  BP: 160/95  Pulse: 66  Temp:   Resp:     Complications: No apparent anesthesia complications

## 2014-08-15 NOTE — H&P (View-Only) (Signed)
HISTORY AND PHYSICAL     CC:  In need of permanent HD access Referring Provider:  [name], Kathryn F, MD  HPI: This is a 44 y.o. male who has a longstanding hx of diabetes (> 20 years) with hx of left below knee amputation in 2013 due to non healing ulcers as a result of his diabetes.    In 2015, he was hospitalized with a hypertensive emergency with acute on chronic renal failure with a creatinine in the 3's.  His creatinine in the past week was 8.  He is referred here by Dr. [name].  He states that he is not yet on HD yet and does not have an idea of when he might be going on HD.  He states that Dr. [name] has discussed different options with him for dialysis including HD at home or peritoneal dialysis. According to Dr. [name]'s note, he was referred for evaluation for transplant, but did not make it to the appointment.    He is on insulin for his diabetes.  He is on a statin for his cholesterol.  He is on a beta blocker for his HTN.    He currently uses smokeless tobacco (snuff) and has for 20 years.  He drinks 1-3 cans of beer/week and 1-3 shots of liquor/week.  He continues to work full time.  The pt is right hand dominant.  Past Medical History  Diagnosis Date  . Acid reflux   . Diabetic foot ulcer 06/2011; 10/2011-12/2011    left dorsal foot  (01/03/2012)  . Osteomyelitis of left foot   . Type II diabetes mellitus   . Hypertension   . Chronic kidney disease     CKD STAGE 4    Past Surgical History  Procedure Laterality Date  . Toe amputation  2009; 2012?    left great; left 2nd toe  . Amputation  01/06/2012    Procedure: AMPUTATION BELOW KNEE;  Surgeon: Austin Hewitt, MD;  Location: [facility] OR;  Service: Orthopedics;  Laterality: Left;  DR.HEWITT WOULD LIKE TO FOLLOW HIS OTHER AFTERNOON CASE STARTING AROUND 1700-1715    Allergies  Allergen Reactions  . Norvasc [Amlodipine Besylate]     Leg swelling     Current Outpatient Prescriptions  Medication Sig  Dispense Refill  . Blood Glucose Monitoring Suppl (RELION PRIME MONITOR) DEVI 1 Units by Does not apply route 3 (three) times daily before meals. 100 Device 11  . furosemide (LASIX) 40 MG tablet Take 1 tablet (40 mg total) by mouth daily. 30 tablet 5  . glucose blood (RELION PRIME TEST) test strip Use as instructed 100 each 12  . Insulin Lispro Prot & Lispro (HUMALOG MIX 75/25 KWIKPEN) (75-25) 100 UNIT/ML Kwikpen Inject 12 Units into the skin 2 (two) times daily. 15 mL 5  . Insulin Pen Needle 31G X 5 MM MISC 1 Units by Does not apply route as needed. 100 each 11  . labetalol (NORMODYNE) 100 MG tablet Take 1 tablet (100 mg total) by mouth 2 (two) times daily. 60 tablet 5  . pravastatin (PRAVACHOL) 40 MG tablet Take 1 tablet (40 mg total) by mouth daily. 30 tablet 11  . RELION LANCETS THIN 26G MISC 1 Units by Does not apply route 3 (three) times daily before meals. 100 each 11   No current facility-administered medications for this visit.    History reviewed. No pertinent family history.  History   Social History  . Marital Status: Legally Separated    Spouse Name: N/A  .   Number of Children: N/A  . Years of Education: N/A   Occupational History  . Not on file.   Social History Main Topics  . Smoking status: Light Tobacco Smoker -- 0.10 packs/day for 7 years    Types: Cigars  . Smokeless tobacco: Current User    Types: Snuff     Comment: 01/03/2012 "quit cigarettes 15-20 yr ago; occasionally smoke a pipe; dip rarely - last time ~ 3 months ago"  07/2013 "occasional cigar"  . Alcohol Use: 1.2 oz/week    2 Cans of beer per week     Comment: 01/03/2012 "couple beers once/wk"  . Drug Use: No  . Sexual Activity: Yes   Other Topics Concern  . Not on file   Social History Narrative     ROS: [x] Positive   [ ] Negative   [ ] All sytems reviewed and are negative  Cardiovascular: [] chest pain/pressure [] palpitations [] SOB lying flat [] DOE [] pain in legs while walking [] pain  in feet when lying flat [] hx of DVT [] hx of phlebitis [x] swelling in legs [] varicose veins  Pulmonary: [] productive cough [] asthma [] wheezing  Neurologic: [] weakness in [] arms [] legs [] numbness in [] arms [] legs []difficulty speaking or slurred speech [] temporary loss of vision in one eye [] dizziness  Hematologic: [] bleeding problems [] problems with blood clotting easily  GI [] vomiting blood [] blood in stool  GU: [] burning with urination [] blood in urine  Psychiatric: [] hx of major depression  Integumentary: [] rashes [] ulcers  Constitutional: [] fever [] chills   PHYSICAL EXAMINATION:  Filed Vitals:   08/11/14 1359  BP: 165/108  Pulse: 69  Resp:    Body mass index is 38.77 kg/(m^2).  General:  WDWN obese in NAD Gait: Not observed HENT: WNL, normocephalic Pulmonary: normal non-labored breathing , without Rales, rhonchi,  wheezing Cardiac: RRR, without  Murmurs, rubs or gallops; without carotid bruits Abdomen: obese Skin: without rashes, without ulcers  Vascular Exam/Pulses:  Right Left  Radial 2+ (normal) 2+ (normal)  Ulnar 2+ (normal) 2+ (normal)  DP Unable to palpate  BKA  PT Unable to palpate  BKA   Extremities: without ischemic changes, without Gangrene , without cellulitis; without open wounds; left leg with prosthetic  Musculoskeletal: no muscle wasting or atrophy  Neurologic: A&O X 3; Appropriate Affect ; SENSATION: normal; MOTOR FUNCTION:  moving all extremities equally. Speech is fluent/normal   Non-Invasive Vascular Imaging:   Bilateral upper extremity vein mapping 08/11/14: Right Cephalic measures:  0.31-0.37cm Right Basilic measures:  0.44-0.66cm  Left Cephalic measures:  0.37-0.46cm Left Basilic measures:  0.33-0.61cm  Upper extremity arterial duplex evaluation 08/11/14: -Patent bilateral upper extremity arteries with no evidence of hemodynamically significant stenosis noted and waveforms are triphasic    -bilateral brachial artery bifurcations are noted at the antecubital fossa   Pt meds includes: Statin:  Yes.   Beta Blocker:  Yes.   Aspirin:  No. ACEI:  No. ARB:  No. Other Antiplatelet/Anticoagulant:  No.    ASSESSMENT/PLAN:: 44 y.o. male with CKD5 in need of permanent HD access placement   -pt is not yet on HD and is right hand dominant.   -his vein mapping reveals satisfactory vein for a left radial cephalic AVF, which will be scheduled for Dr. Dickson on Tuesday, August 15, 2014 -risks were discussed with the pt that included additional procedures in the future as well   as steal syndrome and the symptoms of this.   -pt understands and wishes to proceed.   Samantha Rhyne, PA-C [facility] [phone]  Clinic MD:  Pt seen and examined in conjunction with Dr. Ruthanna Macchia  I agree with the above.  I have seen and evaluated the patient.  He is right handed.  I think he would be a great candidate for a left radiocephalic fistula.  We discussed the risks and benefits of the operation including the need for future procedures, the risk of steal syndrome, and fistula nonfunction.  He has been scheduled for Tuesday, June 21  Austin Mcdaniel 

## 2014-08-15 NOTE — Addendum Note (Signed)
Addendum  created 08/15/14 1900 by Gwenyth Allegra, CRNA   Modules edited: Anesthesia Events, Narrator   Narrator:  Narrator: Event Log Edited

## 2014-08-15 NOTE — Transfer of Care (Signed)
Immediate Anesthesia Transfer of Care Note  Patient: Austin Mcdaniel  Procedure(s) Performed: Procedure(s): RADIOCEPHALIC ARTERIOVENOUS Left FISTULA CREATION (Left) INSERTION OF DIALYSIS CATHETER (N/A)  Patient Location: PACU  Anesthesia Type:General  Level of Consciousness: sedated and patient cooperative  Airway & Oxygen Therapy: Patient Spontanous Breathing and Patient connected to face mask oxygen  Post-op Assessment: Report given to RN and Post -op Vital signs reviewed and stable  Post vital signs: Reviewed and stable  Last Vitals:  Filed Vitals:   08/15/14 1215  BP: 130/78  Pulse: 68  Temp: 36.4 C  Resp: 20    Complications: No apparent anesthesia complications

## 2014-08-16 ENCOUNTER — Encounter (HOSPITAL_COMMUNITY): Payer: Self-pay | Admitting: Vascular Surgery

## 2014-08-16 LAB — HEPATITIS B SURFACE ANTIGEN: HEP B S AG: NEGATIVE

## 2014-08-21 ENCOUNTER — Telehealth: Payer: Self-pay | Admitting: Vascular Surgery

## 2014-08-21 NOTE — Telephone Encounter (Signed)
LM for pt re appt, dpm °

## 2014-08-21 NOTE — Telephone Encounter (Signed)
-----   Message from Sharee Pimple, RN sent at 08/15/2014  2:26 PM EDT ----- Regarding: Schedule   ----- Message -----    From: Sharee Pimple, RN    Sent: 08/15/2014   2:25 PM      To: [facility] Charge Pool Subject: Austin Mcdaniel charge                                    ----- Message -----    From: Chuck Hint, MD    Sent: 08/15/2014  12:42 PM      To: [facility] Charge Pool Subject: charge and f/u                                 This patient had ultrasound-guided placement of a right IJ tunneled dialysis catheter and also a left radiocephalic AV fistula. Asst. Was Karsten Ro, [facility] He will need a follow up visit in 6 weeks with a duplex to check on the maturation of his fistula. Thank you. CD

## 2014-08-25 ENCOUNTER — Encounter (HOSPITAL_COMMUNITY): Payer: 59

## 2014-09-25 ENCOUNTER — Encounter: Payer: Self-pay | Admitting: Vascular Surgery

## 2014-09-27 ENCOUNTER — Encounter: Payer: 59 | Admitting: Vascular Surgery

## 2014-09-27 ENCOUNTER — Encounter (HOSPITAL_COMMUNITY): Payer: 59

## 2014-10-04 ENCOUNTER — Ambulatory Visit (HOSPITAL_COMMUNITY)
Admission: RE | Admit: 2014-10-04 | Discharge: 2014-10-04 | Disposition: A | Discharge status: Home / Self Care | Payer: 59 | Source: Ambulatory Visit | Attending: Vascular Surgery | Admitting: Vascular Surgery

## 2014-10-04 DIAGNOSIS — N186 End stage renal disease: Secondary | ICD-10-CM

## 2014-10-04 DIAGNOSIS — Z4931 Encounter for adequacy testing for hemodialysis: Secondary | ICD-10-CM | POA: Diagnosis not present

## 2014-10-18 ENCOUNTER — Encounter: Payer: 59 | Admitting: Vascular Surgery

## 2014-10-24 ENCOUNTER — Encounter: Payer: Self-pay | Admitting: Vascular Surgery

## 2014-10-25 ENCOUNTER — Ambulatory Visit (INDEPENDENT_AMBULATORY_CARE_PROVIDER_SITE_OTHER): Payer: Self-pay | Admitting: Vascular Surgery

## 2014-10-25 ENCOUNTER — Encounter: Payer: Self-pay | Admitting: Vascular Surgery

## 2014-10-25 VITALS — BP 193/95 | HR 71 | Temp 99.0°F | Resp 16 | Ht 73.0 in | Wt 274.0 lb

## 2014-10-25 DIAGNOSIS — N185 Chronic kidney disease, stage 5: Secondary | ICD-10-CM

## 2014-10-25 NOTE — Progress Notes (Signed)
Filed Vitals:   10/25/14 1606 10/25/14 1613  BP: 191/101 193/95  Pulse: 72 71  Temp: 99 F (37.2 C)   TempSrc: Oral   Resp: 16   Height:  (1.854 m)   Weight: 274 lb (124.286 kg)   SpO2: 98%

## 2014-10-25 NOTE — Progress Notes (Signed)
Patient name: Austin Mcdaniel MRN: [id number] DOB: [birth year] Sex: male  REASON FOR VISIT: Follow up after left radiocephalic AV fistula.  HPI: Haakon Titsworth is a 44 y.o. male who had a tunneled dialysis catheter and a left radial cephalic AV fistula placed on 40/98/1191. He comes in for routine follow up visit. He has no specific complaints or verbal to his left arm. He denies pain or paresthesias.  Current Outpatient Prescriptions  Medication Sig Dispense Refill  . Blood Glucose Monitoring Suppl (RELION PRIME MONITOR) DEVI 1 Units by Does not apply route 3 (three) times daily before meals. 100 Device 11  . Cyanocobalamin (VITAMIN B-12 PO) Take 1 tablet by mouth daily.    Marland Kitchen glucose blood (RELION PRIME TEST) test strip Use as instructed 100 each 12  . HYDROcodone-acetaminophen (NORCO) 5-325 MG per tablet Take 1 tablet by mouth every 6 (six) hours as needed for moderate pain. 30 tablet 0  . Insulin Lispro Prot & Lispro (HUMALOG MIX 75/25 KWIKPEN) (75-25) 100 UNIT/ML Kwikpen Inject 12 Units into the skin 2 (two) times daily. 15 mL 5  . Insulin Pen Needle 31G X 5 MM MISC 1 Units by Does not apply route as needed. 100 each 11  . labetalol (NORMODYNE) 100 MG tablet Take 1 tablet (100 mg total) by mouth 2 (two) times daily. 60 tablet 5  . Multiple Vitamins-Minerals (MULTIVITAMIN WITH MINERALS) tablet Take 1 tablet by mouth daily.    Marland Kitchen oxyCODONE-acetaminophen (ROXICET) 5-325 MG per tablet Take 1 tablet by mouth every 6 (six) hours as needed for severe pain. 20 tablet 0  . pravastatin (PRAVACHOL) 40 MG tablet Take 1 tablet (40 mg total) by mouth daily. 30 tablet 11  . RELION LANCETS THIN 26G MISC 1 Units by Does not apply route 3 (three) times daily before meals. 100 each 11  . RENVELA 800 MG tablet 3 (three) times daily. And with snacks    . furosemide (LASIX) 40 MG tablet Take 1 tablet (40 mg total) by mouth daily. (Patient not taking: Reported on 10/25/2014) 30 tablet 5   No current  facility-administered medications for this visit.   REVIEW OF SYSTEMS: Arly.Keller ] denotes positive finding; [  ] denotes negative finding  CARDIOVASCULAR:   chest pain    dyspnea on exertion    CONSTITUTIONAL:   fever    chills  PHYSICAL EXAM: Filed Vitals:   10/25/14 1606 10/25/14 1613  BP: 191/101 193/95  Pulse: 72 71  Temp: 99 F (37.2 C)   TempSrc: Oral   Resp: 16   Height:  (1.854 m)   Weight: 274 lb (124.286 kg)   SpO2: 98%    GENERAL: The patient is a well-nourished male, in no acute distress. The vital signs are documented above. CARDIOVASCULAR: There is a regular rate and rhythm. PULMONARY: There is good air exchange bilaterally without wheezing or rales. His fistula in the left forearm has an excellent thrill. He has a palpable radial pulse. His incision is healing nicely.  DIALYSIS FISTULA DUPLEX: Diameters of the fistula range from 0.48-0.72 cm. There are 2 competing branches noted.  MEDICAL ISSUES:  STAGE V CHRONIC KIDNEY DISEASE: His left radial cephalic fistula appears to be maturing adequately. I think it should be ready for cannulation in late September. If there are any problems with it and we could consider a fistulogram to evaluate him for ligation of the competing branches. However currently by exam this should be usable by late September.  He knows to call sooner if he has any problems.  Waverly Ferrari [facility] [place] Beeper: [phone]

## 2015-03-26 ENCOUNTER — Encounter (HOSPITAL_COMMUNITY): Payer: Self-pay | Admitting: Emergency Medicine

## 2015-03-26 ENCOUNTER — Emergency Department (HOSPITAL_COMMUNITY)
Admission: EM | Admit: 2015-03-26 | Discharge: 2015-03-27 | Disposition: A | Discharge status: Home / Self Care | Payer: 59 | Attending: Emergency Medicine | Admitting: Emergency Medicine

## 2015-03-26 ENCOUNTER — Emergency Department (HOSPITAL_COMMUNITY): Payer: 59

## 2015-03-26 DIAGNOSIS — Z794 Long term (current) use of insulin: Secondary | ICD-10-CM | POA: Diagnosis not present

## 2015-03-26 DIAGNOSIS — I120 Hypertensive chronic kidney disease with stage 5 chronic kidney disease or end stage renal disease: Secondary | ICD-10-CM | POA: Diagnosis not present

## 2015-03-26 DIAGNOSIS — R7989 Other specified abnormal findings of blood chemistry: Secondary | ICD-10-CM | POA: Diagnosis present

## 2015-03-26 DIAGNOSIS — Z862 Personal history of diseases of the blood and blood-forming organs and certain disorders involving the immune mechanism: Secondary | ICD-10-CM | POA: Diagnosis not present

## 2015-03-26 DIAGNOSIS — N186 End stage renal disease: Secondary | ICD-10-CM | POA: Insufficient documentation

## 2015-03-26 DIAGNOSIS — F17210 Nicotine dependence, cigarettes, uncomplicated: Secondary | ICD-10-CM | POA: Insufficient documentation

## 2015-03-26 DIAGNOSIS — K219 Gastro-esophageal reflux disease without esophagitis: Secondary | ICD-10-CM | POA: Insufficient documentation

## 2015-03-26 DIAGNOSIS — Z992 Dependence on renal dialysis: Secondary | ICD-10-CM | POA: Diagnosis not present

## 2015-03-26 DIAGNOSIS — T8571XA Infection and inflammatory reaction due to peritoneal dialysis catheter, initial encounter: Secondary | ICD-10-CM | POA: Diagnosis not present

## 2015-03-26 DIAGNOSIS — Y658 Other specified misadventures during surgical and medical care: Secondary | ICD-10-CM | POA: Diagnosis not present

## 2015-03-26 DIAGNOSIS — E119 Type 2 diabetes mellitus without complications: Secondary | ICD-10-CM | POA: Insufficient documentation

## 2015-03-26 DIAGNOSIS — Z79899 Other long term (current) drug therapy: Secondary | ICD-10-CM | POA: Insufficient documentation

## 2015-03-26 HISTORY — DX: Dependence on renal dialysis: Z99.2

## 2015-03-26 LAB — URINALYSIS, ROUTINE W REFLEX MICROSCOPIC
Bilirubin Urine: NEGATIVE
Glucose, UA: 250 mg/dL — AB
Ketones, ur: NEGATIVE mg/dL
LEUKOCYTES UA: NEGATIVE
NITRITE: NEGATIVE
PH: 5 (ref 5.0–8.0)
Protein, ur: >300 mg/dL — AB
Specific Gravity, Urine: 1.017 (ref 1.005–1.030)

## 2015-03-26 LAB — COMPREHENSIVE METABOLIC PANEL
ALBUMIN: 3 g/dL — AB (ref 3.5–5.0)
ALT: 28 U/L (ref 17–63)
ANION GAP: 16 — AB (ref 5–15)
AST: 20 U/L (ref 15–41)
Alkaline Phosphatase: 49 U/L (ref 38–126)
BUN: 93 mg/dL — ABNORMAL HIGH (ref 6–20)
CO2: 21 mmol/L — AB (ref 22–32)
Calcium: 8.5 mg/dL — ABNORMAL LOW (ref 8.9–10.3)
Chloride: 100 mmol/L — ABNORMAL LOW (ref 101–111)
Creatinine, Ser: 10.42 mg/dL — ABNORMAL HIGH (ref 0.61–1.24)
GFR calc Af Amer: 6 mL/min — ABNORMAL LOW (ref >60)
GFR calc non Af Amer: 5 mL/min — ABNORMAL LOW (ref >60)
Glucose, Bld: 249 mg/dL — ABNORMAL HIGH (ref 65–99)
POTASSIUM: 4.4 mmol/L (ref 3.5–5.1)
Sodium: 137 mmol/L (ref 135–145)
TOTAL PROTEIN: 6.3 g/dL — AB (ref 6.5–8.1)
Total Bilirubin: 0.9 mg/dL (ref 0.3–1.2)

## 2015-03-26 LAB — CBC WITH DIFFERENTIAL/PLATELET
Basophils Absolute: 0 10*3/uL (ref 0.0–0.1)
Basophils Relative: 0 %
EOS ABS: 0.1 10*3/uL (ref 0.0–0.7)
EOS PCT: 0 %
HCT: 32.9 % — ABNORMAL LOW (ref 39.0–52.0)
Hemoglobin: 10.7 g/dL — ABNORMAL LOW (ref 13.0–17.0)
Lymphocytes Relative: 9 %
Lymphs Abs: 1.3 10*3/uL (ref 0.7–4.0)
MCH: 29.8 pg (ref 26.0–34.0)
MCHC: 32.5 g/dL (ref 30.0–36.0)
MCV: 91.6 fL (ref 78.0–100.0)
MONO ABS: 0.9 10*3/uL (ref 0.1–1.0)
Monocytes Relative: 6 %
Neutro Abs: 11.9 10*3/uL — ABNORMAL HIGH (ref 1.7–7.7)
Neutrophils Relative %: 85 %
Platelets: 152 10*3/uL (ref 150–400)
RBC: 3.59 MIL/uL — ABNORMAL LOW (ref 4.22–5.81)
RDW: 15.6 % — AB (ref 11.5–15.5)
WBC: 14.1 10*3/uL — ABNORMAL HIGH (ref 4.0–10.5)

## 2015-03-26 LAB — URINE MICROSCOPIC-ADD ON

## 2015-03-26 LAB — LIPASE, BLOOD: Lipase: 18 U/L (ref 11–51)

## 2015-03-26 LAB — LACTATE DEHYDROGENASE: LDH: 217 U/L — AB (ref 98–192)

## 2015-03-26 LAB — ALBUMIN: Albumin: 3 g/dL — ABNORMAL LOW (ref 3.5–5.0)

## 2015-03-26 MED ORDER — SODIUM CHLORIDE 0.9 % IV BOLUS (SEPSIS)
1000.0000 mL | Freq: Once | INTRAVENOUS | Status: AC
  Administered 2015-03-26: 1000 mL via INTRAVENOUS

## 2015-03-26 NOTE — ED Notes (Signed)
Pt. received a call from dialysis center that his peritoneal dialysis test results shows elevated WBC , pt. states feeling tired , fatigue and mild mid abdominal pain . Denies fever or chills.

## 2015-03-27 ENCOUNTER — Telehealth (HOSPITAL_BASED_OUTPATIENT_CLINIC_OR_DEPARTMENT_OTHER): Payer: Self-pay | Admitting: Emergency Medicine

## 2015-03-27 LAB — GRAM STAIN

## 2015-03-27 LAB — BODY FLUID CELL COUNT WITH DIFFERENTIAL
Eos, Fluid: 1 %
LYMPHS FL: 14 %
Monocyte-Macrophage-Serous Fluid: 8 % — ABNORMAL LOW (ref 50–90)
NEUTROPHIL FLUID: 77 % — AB (ref 0–25)
WBC FLUID: 3306 uL — AB (ref 0–1000)

## 2015-03-27 LAB — PATHOLOGIST SMEAR REVIEW

## 2015-03-27 MED ORDER — DEXTROSE 5 % IV SOLN
2.0000 g | Freq: Once | INTRAVENOUS | Status: AC
  Administered 2015-03-27: 2 g via INTRAVENOUS
  Filled 2015-03-27: qty 2

## 2015-03-27 MED ORDER — VANCOMYCIN HCL 10 G IV SOLR
2000.0000 mg | Freq: Once | INTRAVENOUS | Status: AC
  Administered 2015-03-27: 2000 mg via INTRAVENOUS
  Filled 2015-03-27: qty 2000

## 2015-03-27 MED ORDER — FENTANYL CITRATE (PF) 100 MCG/2ML IJ SOLN
50.0000 ug | Freq: Once | INTRAMUSCULAR | Status: AC
  Administered 2015-03-27: 50 ug via INTRAVENOUS
  Filled 2015-03-27: qty 2

## 2015-03-27 NOTE — ED Provider Notes (Signed)
CSN: [id number]     Arrival date & time 03/26/15  2156 History   First MD Initiated Contact with Patient 03/26/15 2216     Chief Complaint  Patient presents with  . Abnormal Lab     (Consider location/radiation/quality/duration/timing/severity/associated sxs/prior Treatment) HPI   87 y m w ESRD on PD, DM, HTN, who is coming in because of elevated WBC in PD sampling this morning.  Pt had been having mild generalized abdominal pain and fatigue x1 day.  The abdominal pain is diffuse, achy, worse with movement, no vomiting/diarrhea or other sx.  He went to PD center this AM and peritoneal dialysis fluid sampling showed WBC 4000, he was told to come to the ED  Past Medical History  Diagnosis Date  . Acid reflux   . Diabetic foot ulcer (HCC) 06/2011; 10/2011-12/2011    left dorsal foot  (01/03/2012)  . Osteomyelitis of left foot (HCC)   . Type II diabetes mellitus (HCC)   . Hypertension   . Chronic kidney disease     CKD STAGE 4  . Spinal headache   . Anemia   . Peritoneal dialysis status [facility])    Past Surgical History  Procedure Laterality Date  . Toe amputation  2009; 2012?    left great; left 2nd toe  . Amputation  01/06/2012    Procedure: AMPUTATION BELOW KNEE;  Surgeon: Toni Arthurs, MD;  Location: [facility] OR;  Service: Orthopedics;  Laterality: Left;  DR.HEWITT WOULD LIKE TO FOLLOW HIS OTHER AFTERNOON CASE STARTING AROUND 1700-1715  . Av fistula placement Left 08/15/2014    Procedure: RADIOCEPHALIC ARTERIOVENOUS Left FISTULA CREATION;  Surgeon: Chuck Hint, MD;  Location: [facility] OR;  Service: Vascular;  Laterality: Left;  . Insertion of dialysis catheter N/A 08/15/2014    Procedure: INSERTION OF DIALYSIS CATHETER;  Surgeon: Chuck Hint, MD;  Location: [facility] OR;  Service: Vascular;  Laterality: N/A;   No family history on file. Social History  Substance Use Topics  . Smoking status: Light Tobacco Smoker -- 0.10 packs/day for 7 years    Types: Cigars  . Smokeless  tobacco: Current User    Types: Snuff     Comment: 01/03/2012 "quit cigarettes 15-20 yr ago; occasionally smoke a pipe; dip rarely - last time ~ 3 months ago"  07/2013 "occasional cigar"  . Alcohol Use: 1.2 oz/week    2 Cans of beer per week     Comment: social    Review of Systems  Constitutional: Negative for fever and chills.  Eyes: Negative for redness.  Respiratory: Negative for cough and shortness of breath.   Cardiovascular: Negative for chest pain.  Gastrointestinal: Positive for abdominal pain. Negative for nausea, vomiting and diarrhea.  Genitourinary: Negative for dysuria.  Skin: Negative for rash.  Neurological: Negative for headaches.  All other systems reviewed and are negative.     Allergies  Norvasc  Home Medications   Prior to Admission medications   Medication Sig Start Date End Date Taking? Authorizing Provider  carvedilol (COREG) 12.5 MG tablet Take 12.5 mg by mouth 2 (two) times daily. 03/01/15  Yes Historical Provider, MD  hydrALAZINE (APRESOLINE) 25 MG tablet Take 25 mg by mouth 2 (two) times daily. 03/01/15  Yes Historical Provider, MD  Insulin Lispro Prot & Lispro (HUMALOG MIX 75/25 KWIKPEN) (75-25) 100 UNIT/ML Kwikpen Inject 12 Units into the skin 2 (two) times daily. Patient taking differently: Inject 15 Units into the skin 2 (two) times daily.  06/02/14  Yes Marthenia Rolling  Hoffman, DO  isosorbide mononitrate (IMDUR) 30 MG 24 hr tablet Take 30 mg by mouth daily. 03/01/15  Yes Historical Provider, MD  RENVELA 800 MG tablet Take 800 mg by mouth 3 (three) times daily. And with snacks 09/06/14  Yes Historical Provider, MD  Blood Glucose Monitoring Suppl (RELION PRIME MONITOR) DEVI 1 Units by Does not apply route 3 (three) times daily before meals. 06/02/14   Gust Rung, DO  furosemide (LASIX) 40 MG tablet Take 1 tablet (40 mg total) by mouth daily. Patient not taking: Reported on 10/25/2014 06/02/14 06/02/15  Gust Rung, DO  glucose blood (RELION PRIME TEST) test strip Use  as instructed 06/02/14   Gust Rung, DO  HYDROcodone-acetaminophen (NORCO) 5-325 MG per tablet Take 1 tablet by mouth every 6 (six) hours as needed for moderate pain. Patient not taking: Reported on 03/26/2015 08/15/14   Lars Mage, PA-C  Insulin Pen Needle 31G X 5 MM MISC 1 Units by Does not apply route as needed. 06/02/14   Gust Rung, DO  labetalol (NORMODYNE) 100 MG tablet Take 1 tablet (100 mg total) by mouth 2 (two) times daily. Patient not taking: Reported on 03/26/2015 06/02/14   Gust Rung, DO  oxyCODONE-acetaminophen (ROXICET) 5-325 MG per tablet Take 1 tablet by mouth every 6 (six) hours as needed for severe pain. Patient not taking: Reported on 03/26/2015 08/15/14   Raymond Gurney, PA-C  pravastatin (PRAVACHOL) 40 MG tablet Take 1 tablet (40 mg total) by mouth daily. Patient not taking: Reported on 03/26/2015 06/02/14   Gust Rung, DO  RELION LANCETS THIN 26G MISC 1 Units by Does not apply route 3 (three) times daily before meals. 06/02/14   Gust Rung, DO   BP 158/89 mmHg  Pulse 66  Temp(Src) 98.6 F (37 C) (Oral)  Resp 18  SpO2 93% Physical Exam  Constitutional: He is oriented to person, place, and time. No distress.  HENT:  Head: Normocephalic and atraumatic.  Eyes: EOM are normal. Pupils are equal, round, and reactive to light.  Neck: Normal range of motion. Neck supple.  Cardiovascular: Normal rate.   Pulmonary/Chest: Effort normal. No respiratory distress.  Abdominal: Soft. He exhibits distension. There is tenderness (diffuse, mild).  Musculoskeletal: Normal range of motion.  Neurological: He is alert and oriented to person, place, and time.  Skin: No rash noted. He is not diaphoretic.  Psychiatric: He has a normal mood and affect.    ED Course  Procedures (including critical care time) Labs Review Labs Reviewed  CBC WITH DIFFERENTIAL/PLATELET - Abnormal; Notable for the following:    WBC 14.1 (*)    RBC 3.59 (*)    Hemoglobin 10.7 (*)    HCT 32.9  (*)    RDW 15.6 (*)    Neutro Abs 11.9 (*)    All other components within normal limits  COMPREHENSIVE METABOLIC PANEL - Abnormal; Notable for the following:    Chloride 100 (*)    CO2 21 (*)    Glucose, Bld 249 (*)    BUN 93 (*)    Creatinine, Ser 10.42 (*)    Calcium 8.5 (*)    Total Protein 6.3 (*)    Albumin 3.0 (*)    GFR calc non Af Amer 5 (*)    GFR calc Af Amer 6 (*)    Anion gap 16 (*)    All other components within normal limits  URINALYSIS, ROUTINE W REFLEX MICROSCOPIC (NOT AT [facility]) -  Abnormal; Notable for the following:    APPearance CLOUDY (*)    Glucose, UA 250 (*)    Hgb urine dipstick MODERATE (*)    Protein, ur >300 (*)    All other components within normal limits  URINE MICROSCOPIC-ADD ON - Abnormal; Notable for the following:    Squamous Epithelial / LPF 0-5 (*)    Bacteria, UA FEW (*)    Casts GRANULAR CAST (*)    All other components within normal limits  ALBUMIN - Abnormal; Notable for the following:    Albumin 3.0 (*)    All other components within normal limits  LACTATE DEHYDROGENASE - Abnormal; Notable for the following:    LDH 217 (*)    All other components within normal limits  BODY FLUID CELL COUNT WITH DIFFERENTIAL - Abnormal; Notable for the following:    Color, Fluid STRAW (*)    Appearance, Fluid CLOUDY (*)    WBC, Fluid 3306 (*)    Neutrophil Count, Fluid 77 (*)    Monocyte-Macrophage-Serous Fluid 8 (*)    All other components within normal limits  CULTURE, BODY FLUID-BOTTLE  GRAM STAIN  CULTURE, BLOOD (ROUTINE X 2)  CULTURE, BLOOD (ROUTINE X 2)  LIPASE, BLOOD  PATHOLOGIST SMEAR REVIEW    Imaging Review Dg Chest 2 View  03/26/2015  CLINICAL DATA:  Elevated white blood cell count EXAM: CHEST  2 VIEW COMPARISON:  08/15/2014 FINDINGS: Mild cardiac enlargement. No pleural effusion or edema. The lungs are clear. No airspace consolidation. The visualized osseous structures are unremarkable. IMPRESSION: 1. No acute cardiopulmonary  abnormalities. Electronically Signed   By: Signa Kell M.D.   On: 03/26/2015 23:39   I have personally reviewed and evaluated these images and lab results as part of my medical decision-making.   EKG Interpretation None      MDM   Final diagnoses:  Infection associated with peritoneal dialysis catheter, initial encounter (HCC)   24 y m w ESRD on PD, DM, HTN, who is coming in because of elevated WBC in PD sampling this morning.  Exam as above.  Afebrile, well appearing.  Cbc/cmp obtained and he has mild leukocytosis. Have resampled PD fluid and repeat with WBC 3000.  I have discussed patient with nephro attending on call, we feel he likely has catheter associated peritonitis.  Nephro attending recommended one time IV doses of ceftaz and vanc in the ED and then follow up at PD clinic in the morning for continuing intra-peritoneal abx.  I feel this is reasonable given his well appearance. He has a non-focal abdominal exam, doubt other dangerous intra-abdominal infection such as appy or diverticulitis.  I have discussed the results, Dx and Tx plan with the pt. They expressed understanding and agree with the plan and were told to return to ED with any worsening of condition or concern.    Disposition: Discharge  Condition: Good  Discharge Medication List as of 03/27/2015 12:36 AM      Follow Up: [facility] EMERGENCY DEPARTMENT [address] [id number] mc [place] [address] [phone]  As needed   Pt seen in conjunction with Dr. Leane Para, MD 03/27/15 1309  Richardean Canal, MD 03/29/15 [phone]

## 2015-03-27 NOTE — Discharge Instructions (Signed)
Please go to your dialysis training center in the morning where you will need additional doses of antibiotics for your intra-peritoneal infection.    Peritonitis Peritonitis is inflammation of the peritoneum. The peritoneum is the tissue that lines the abdomen and covers the internal organs. Certain conditions or injuries can cause organs to leak stool, bacteria, fungi, or chemicals, such as bile or other digestive fluids, into the abdomen. When these substances come into contact with the peritoneum, they may cause irritation or infection. Peritonitis can be a life-threatening infection if not treated promptly. The infection can progress to involve the whole body (sepsis). CAUSES  Many conditions can cause peritonitis, including:  Appendicitis.  Pancreatitis.  Diverticulitis.  Ulcers.  Crohn disease or ulcerative colitis.  Cancer.  Liver disease.  Tuberculosis. Other possible causes are:   Injury, such as:  Abdominal injury.  Injury to the stomach or esophagus.  Other infections inside the abdomen or pelvis.  Peritoneal dialysis. This is a procedure used to cleanse the blood when the kidneys are unable to do so. SIGNS AND SYMPTOMS   Severe abdominal pain.  Hard-feeling abdomen.  Abdominal swelling.  Fever and chills.  Nausea and vomiting.  Poor appetite or no appetite.  Inability to pass gas or stool.  Diarrhea.  Less frequent urination. DIAGNOSIS  Your health care provider will perform a physical exam and take your medical history. Other tests that are done may include:  Blood tests.  Urinalysis.  Stool analysis, if you have associated diarrhea.  Paracentesis.  X-ray.  Ultrasound.  CT scans. TREATMENT  Treatment for peritonitis requires treating the symptoms and also the underlying cause of peritonitis. Treatment may include:  Antibiotic medicines.  Surgery to remove infected fluid and tissue.  Surgery to treat conditions that cause  peritonitis, such as an appendectomy for appendicitis. HOME CARE INSTRUCTIONS   Take medicines only as directed by your health care provider.  If you were prescribed an antibiotic medicine, finish it all even if you start to feel better.  If you were taking prescription medicines for other problems before you developed peritonitis, ask about when you should re-start these medicines.  Follow your health care provider's instructions for diet and activity.  Drink enough fluid to keep your urine clear or pale yellow.  If directed, use a stool softener or laxative.  Rest as much as possible. SEEK MEDICAL CARE IF: You have a fever. SEEK IMMEDIATE MEDICAL CARE IF:  You have severe abdominal pain or tenderness.  You have abdominal swelling.  You start to have the chills.  You have new problems with passing urine.  You develop chest pains or shortness of breath.  You have nausea, vomiting, or diarrhea.  You are unable to pass gas or stool.   This information is not intended to replace advice given to you by your health care provider. Make sure you discuss any questions you have with your health care provider.   Document Released: 01/24/2008 Document Revised: 03/03/2014 Document Reviewed: 09/23/2013 Elsevier Interactive Patient Education [employer]

## 2015-03-28 ENCOUNTER — Telehealth (HOSPITAL_BASED_OUTPATIENT_CLINIC_OR_DEPARTMENT_OTHER): Payer: Self-pay | Admitting: Emergency Medicine

## 2015-03-29 LAB — CULTURE, BODY FLUID W GRAM STAIN -BOTTLE

## 2015-03-30 ENCOUNTER — Telehealth (HOSPITAL_COMMUNITY): Payer: Self-pay | Admitting: *Deleted

## 2015-03-30 NOTE — Progress Notes (Signed)
ED Antimicrobial Stewardship Positive Culture Follow Up   Austin Mcdaniel is an 45 y.o. male who presented to [facility] on 03/26/2015 with a chief complaint of  Chief Complaint  Patient presents with  . Abnormal Lab    Recent Results (from the past 720 hour(s))  Culture, body fluid-bottle     Status: None   Collection Time: 03/26/15 10:55 PM  Result Value Ref Range Status   Specimen Description PERITONEAL  Final   Special Requests LEFT SIDE CATH BOTTLES DRAWN AEROBIC AND ANAEROBIC  Final   Gram Stain   Final    GRAM VARIABLE ROD IN BOTH AEROBIC AND ANAEROBIC BOTTLES CRITICAL RESULT CALLED TO, READ BACK BY AND VERIFIED WITH: Stevie Kern RN 16:20 03/27/15 ([email or web address]) CORRECTED RESULTS CALLED TO: L MILLER,RN AT 1610 03/28/15 BY L BENFIELD PREVIOUSLY REPORTED AS GRAM POSITIVE RODS    Culture SERRATIA MARCESCENS  Final   Report Status 03/29/2015 FINAL  Final   Organism ID, Bacteria SERRATIA MARCESCENS  Final      Susceptibility   Serratia marcescens - MIC*    CEFAZOLIN >=64 RESISTANT Resistant     CEFEPIME <=1 SENSITIVE Sensitive     CEFTAZIDIME <=1 SENSITIVE Sensitive     CEFTRIAXONE <=1 SENSITIVE Sensitive     CIPROFLOXACIN <=0.25 SENSITIVE Sensitive     GENTAMICIN <=1 SENSITIVE Sensitive     TRIMETH/SULFA <=20 SENSITIVE Sensitive     * SERRATIA MARCESCENS  Gram stain     Status: None   Collection Time: 03/26/15 10:55 PM  Result Value Ref Range Status   Specimen Description PERITONEAL  Final   Special Requests LEFT SIDE CATH  Final   Gram Stain   Final    CYTOSPIN SMEAR ABUNDANT WBC PRESENT,BOTH PMN AND MONONUCLEAR NO ORGANISMS SEEN    Report Status 03/27/2015 FINAL  Final  Blood culture (routine x 2)     Status: None (Preliminary result)   Collection Time: 03/26/15 11:00 PM  Result Value Ref Range Status   Specimen Description BLOOD BLOOD RIGHT WRIST  Final   Special Requests BOTTLES DRAWN AEROBIC AND ANAEROBIC  Final   Culture  Setup Time   Final    GRAM POSITIVE COCCI IN  CLUSTERS AEROBIC BOTTLE ONLY CRITICAL RESULT CALLED TO, READ BACK BY AND VERIFIED WITH: K NEWMAN RN 2308 03/29/15 A BROWNING    Culture NO GROWTH 2 DAYS  Final   Report Status PENDING  Incomplete  Blood culture (routine x 2)     Status: None (Preliminary result)   Collection Time: 03/26/15 11:09 PM  Result Value Ref Range Status   Specimen Description BLOOD RIGHT ARM  Final   Special Requests BOTTLES DRAWN AEROBIC AND ANAEROBIC  Final   Culture NO GROWTH 2 DAYS  Final   Report Status PENDING  Incomplete   Contact PD clinic with results - Contact patient to see if receiving IV abx; if not will inform he needs to come back to ER  ED Provider: Cheri Fowler, PA-C  [name] 03/30/2015, 7:49 AM Infectious Diseases Pharmacist Phone# [phone]

## 2015-03-31 LAB — CULTURE, BLOOD (ROUTINE X 2)

## 2015-04-01 LAB — CULTURE, BLOOD (ROUTINE X 2): CULTURE: NO GROWTH

## 2015-04-14 NOTE — Telephone Encounter (Signed)
Unable to contact pt by mail or telephone. Unable to communicate lab results or treatment changes. 

## 2015-05-03 ENCOUNTER — Telehealth: Payer: Self-pay | Admitting: Internal Medicine

## 2015-05-03 NOTE — Telephone Encounter (Signed)
APPT. REMINDER CALL, LMTCB °

## 2015-05-04 ENCOUNTER — Encounter: Payer: Self-pay | Admitting: Internal Medicine

## 2015-05-04 ENCOUNTER — Ambulatory Visit: Payer: 59 | Admitting: Internal Medicine

## 2015-05-17 ENCOUNTER — Telehealth (HOSPITAL_COMMUNITY): Payer: Self-pay | Admitting: *Deleted

## 2015-05-17 NOTE — Telephone Encounter (Signed)
Received signed order from Dr. Ty HiltsKincaid and Dr.Khao. Called and left message for pt to call back for sign up to cardiac rehab. Alanson Alyarlette Carlton RN, BSN

## 2015-06-18 ENCOUNTER — Ambulatory Visit (HOSPITAL_COMMUNITY): Payer: 59

## 2015-06-19 ENCOUNTER — Inpatient Hospital Stay (HOSPITAL_COMMUNITY): Admission: RE | Admit: 2015-06-19 | Payer: 59 | Source: Ambulatory Visit

## 2015-06-20 ENCOUNTER — Ambulatory Visit (HOSPITAL_COMMUNITY): Payer: 59

## 2015-06-22 ENCOUNTER — Ambulatory Visit (HOSPITAL_COMMUNITY): Payer: 59

## 2015-06-25 ENCOUNTER — Ambulatory Visit (HOSPITAL_COMMUNITY): Payer: 59

## 2015-06-27 ENCOUNTER — Ambulatory Visit (HOSPITAL_COMMUNITY): Payer: 59

## 2015-06-29 ENCOUNTER — Ambulatory Visit (HOSPITAL_COMMUNITY): Payer: 59

## 2015-07-02 ENCOUNTER — Ambulatory Visit (HOSPITAL_COMMUNITY): Payer: 59

## 2015-07-04 ENCOUNTER — Ambulatory Visit (HOSPITAL_COMMUNITY): Payer: 59

## 2015-07-06 ENCOUNTER — Ambulatory Visit (HOSPITAL_COMMUNITY): Payer: 59

## 2015-07-09 ENCOUNTER — Ambulatory Visit (HOSPITAL_COMMUNITY): Payer: 59

## 2015-07-11 ENCOUNTER — Ambulatory Visit (HOSPITAL_COMMUNITY): Payer: 59

## 2015-07-13 ENCOUNTER — Ambulatory Visit (HOSPITAL_COMMUNITY): Payer: 59

## 2015-07-16 ENCOUNTER — Ambulatory Visit (HOSPITAL_COMMUNITY): Payer: 59

## 2015-07-18 ENCOUNTER — Ambulatory Visit (HOSPITAL_COMMUNITY): Payer: 59

## 2015-07-20 ENCOUNTER — Ambulatory Visit (HOSPITAL_COMMUNITY): Payer: 59

## 2015-07-25 ENCOUNTER — Ambulatory Visit (HOSPITAL_COMMUNITY): Payer: 59

## 2015-07-27 ENCOUNTER — Ambulatory Visit (HOSPITAL_COMMUNITY): Payer: 59

## 2015-07-30 ENCOUNTER — Ambulatory Visit (HOSPITAL_COMMUNITY): Payer: 59

## 2015-08-01 ENCOUNTER — Ambulatory Visit (HOSPITAL_COMMUNITY): Payer: 59

## 2015-08-03 ENCOUNTER — Ambulatory Visit (HOSPITAL_COMMUNITY): Payer: 59

## 2015-08-06 ENCOUNTER — Ambulatory Visit (HOSPITAL_COMMUNITY): Payer: 59

## 2015-08-08 ENCOUNTER — Ambulatory Visit (HOSPITAL_COMMUNITY): Payer: 59

## 2015-08-10 ENCOUNTER — Ambulatory Visit (HOSPITAL_COMMUNITY): Payer: 59

## 2015-08-13 ENCOUNTER — Ambulatory Visit (HOSPITAL_COMMUNITY): Payer: 59

## 2015-08-15 ENCOUNTER — Ambulatory Visit (HOSPITAL_COMMUNITY): Payer: 59

## 2015-08-17 ENCOUNTER — Ambulatory Visit (HOSPITAL_COMMUNITY): Payer: 59

## 2015-08-20 ENCOUNTER — Ambulatory Visit (HOSPITAL_COMMUNITY): Payer: 59

## 2015-08-22 ENCOUNTER — Ambulatory Visit (HOSPITAL_COMMUNITY): Payer: 59

## 2015-08-24 ENCOUNTER — Ambulatory Visit (HOSPITAL_COMMUNITY): Payer: 59

## 2015-08-27 ENCOUNTER — Ambulatory Visit (HOSPITAL_COMMUNITY): Payer: 59

## 2015-08-29 ENCOUNTER — Ambulatory Visit (HOSPITAL_COMMUNITY): Payer: 59

## 2015-08-31 ENCOUNTER — Ambulatory Visit (HOSPITAL_COMMUNITY): Payer: 59

## 2015-09-03 ENCOUNTER — Ambulatory Visit (HOSPITAL_COMMUNITY): Payer: 59

## 2015-09-05 ENCOUNTER — Ambulatory Visit (HOSPITAL_COMMUNITY): Payer: 59

## 2015-09-07 ENCOUNTER — Ambulatory Visit (HOSPITAL_COMMUNITY): Payer: 59

## 2015-09-10 ENCOUNTER — Ambulatory Visit (HOSPITAL_COMMUNITY): Payer: 59

## 2015-09-12 ENCOUNTER — Ambulatory Visit (HOSPITAL_COMMUNITY): Payer: 59

## 2015-09-14 ENCOUNTER — Ambulatory Visit (HOSPITAL_COMMUNITY): Payer: 59

## 2015-09-17 ENCOUNTER — Ambulatory Visit (HOSPITAL_COMMUNITY): Payer: 59

## 2015-09-19 ENCOUNTER — Ambulatory Visit (HOSPITAL_COMMUNITY): Payer: 59

## 2015-09-21 ENCOUNTER — Ambulatory Visit (HOSPITAL_COMMUNITY): Payer: 59

## 2015-12-30 ENCOUNTER — Inpatient Hospital Stay (HOSPITAL_COMMUNITY)
Admission: EM | Admit: 2015-12-30 | Discharge: 2016-01-01 | DRG: 371 | Disposition: A | Discharge status: Home / Self Care | Payer: 59 | Attending: Internal Medicine | Admitting: Internal Medicine

## 2015-12-30 ENCOUNTER — Emergency Department (HOSPITAL_COMMUNITY): Payer: 59

## 2015-12-30 ENCOUNTER — Encounter (HOSPITAL_COMMUNITY): Payer: Self-pay | Admitting: *Deleted

## 2015-12-30 DIAGNOSIS — Z89422 Acquired absence of other left toe(s): Secondary | ICD-10-CM | POA: Diagnosis not present

## 2015-12-30 DIAGNOSIS — E8889 Other specified metabolic disorders: Secondary | ICD-10-CM | POA: Diagnosis present

## 2015-12-30 DIAGNOSIS — N186 End stage renal disease: Secondary | ICD-10-CM | POA: Diagnosis present

## 2015-12-30 DIAGNOSIS — Z89412 Acquired absence of left great toe: Secondary | ICD-10-CM | POA: Diagnosis not present

## 2015-12-30 DIAGNOSIS — Z794 Long term (current) use of insulin: Secondary | ICD-10-CM | POA: Diagnosis not present

## 2015-12-30 DIAGNOSIS — Z89512 Acquired absence of left leg below knee: Secondary | ICD-10-CM | POA: Diagnosis not present

## 2015-12-30 DIAGNOSIS — N2581 Secondary hyperparathyroidism of renal origin: Secondary | ICD-10-CM | POA: Diagnosis present

## 2015-12-30 DIAGNOSIS — I120 Hypertensive chronic kidney disease with stage 5 chronic kidney disease or end stage renal disease: Secondary | ICD-10-CM | POA: Diagnosis present

## 2015-12-30 DIAGNOSIS — E785 Hyperlipidemia, unspecified: Secondary | ICD-10-CM | POA: Diagnosis present

## 2015-12-30 DIAGNOSIS — D649 Anemia, unspecified: Secondary | ICD-10-CM | POA: Diagnosis present

## 2015-12-30 DIAGNOSIS — K5900 Constipation, unspecified: Secondary | ICD-10-CM | POA: Diagnosis present

## 2015-12-30 DIAGNOSIS — Z992 Dependence on renal dialysis: Secondary | ICD-10-CM

## 2015-12-30 DIAGNOSIS — E1122 Type 2 diabetes mellitus with diabetic chronic kidney disease: Secondary | ICD-10-CM | POA: Diagnosis present

## 2015-12-30 DIAGNOSIS — K659 Peritonitis, unspecified: Secondary | ICD-10-CM | POA: Diagnosis present

## 2015-12-30 DIAGNOSIS — E1159 Type 2 diabetes mellitus with other circulatory complications: Secondary | ICD-10-CM | POA: Diagnosis present

## 2015-12-30 DIAGNOSIS — Z7982 Long term (current) use of aspirin: Secondary | ICD-10-CM | POA: Diagnosis not present

## 2015-12-30 DIAGNOSIS — Z79899 Other long term (current) drug therapy: Secondary | ICD-10-CM

## 2015-12-30 DIAGNOSIS — K219 Gastro-esophageal reflux disease without esophagitis: Secondary | ICD-10-CM | POA: Diagnosis present

## 2015-12-30 DIAGNOSIS — Z951 Presence of aortocoronary bypass graft: Secondary | ICD-10-CM | POA: Diagnosis not present

## 2015-12-30 DIAGNOSIS — Z89519 Acquired absence of unspecified leg below knee: Secondary | ICD-10-CM

## 2015-12-30 DIAGNOSIS — I10 Essential (primary) hypertension: Secondary | ICD-10-CM | POA: Diagnosis not present

## 2015-12-30 DIAGNOSIS — I2510 Atherosclerotic heart disease of native coronary artery without angina pectoris: Secondary | ICD-10-CM | POA: Diagnosis present

## 2015-12-30 DIAGNOSIS — Z8249 Family history of ischemic heart disease and other diseases of the circulatory system: Secondary | ICD-10-CM | POA: Diagnosis not present

## 2015-12-30 DIAGNOSIS — B9689 Other specified bacterial agents as the cause of diseases classified elsewhere: Secondary | ICD-10-CM | POA: Diagnosis present

## 2015-12-30 DIAGNOSIS — R112 Nausea with vomiting, unspecified: Secondary | ICD-10-CM

## 2015-12-30 HISTORY — DX: Disorder of kidney and ureter, unspecified: N28.9

## 2015-12-30 LAB — CBC WITH DIFFERENTIAL/PLATELET
Basophils Absolute: 0 10*3/uL (ref 0.0–0.1)
Basophils Relative: 0 %
EOS PCT: 1 %
Eosinophils Absolute: 0.1 10*3/uL (ref 0.0–0.7)
HCT: 35.8 % — ABNORMAL LOW (ref 39.0–52.0)
HEMOGLOBIN: 12.2 g/dL — AB (ref 13.0–17.0)
LYMPHS ABS: 1.4 10*3/uL (ref 0.7–4.0)
LYMPHS PCT: 12 %
MCH: 31.1 pg (ref 26.0–34.0)
MCHC: 34.1 g/dL (ref 30.0–36.0)
MCV: 91.3 fL (ref 78.0–100.0)
Monocytes Absolute: 1 10*3/uL (ref 0.1–1.0)
Monocytes Relative: 9 %
NEUTROS PCT: 78 %
Neutro Abs: 8.8 10*3/uL — ABNORMAL HIGH (ref 1.7–7.7)
Platelets: 184 10*3/uL (ref 150–400)
RBC: 3.92 MIL/uL — AB (ref 4.22–5.81)
RDW: 13.3 % (ref 11.5–15.5)
WBC: 11.3 10*3/uL — AB (ref 4.0–10.5)

## 2015-12-30 LAB — BODY FLUID CELL COUNT WITH DIFFERENTIAL
Lymphs, Fluid: 5 %
MONOCYTE-MACROPHAGE-SEROUS FLUID: 4 % — AB (ref 50–90)
NEUTROPHIL FLUID: 91 % — AB (ref 0–25)
WBC FLUID: 41200 uL — AB (ref 0–1000)

## 2015-12-30 LAB — URINE MICROSCOPIC-ADD ON

## 2015-12-30 LAB — URINALYSIS, ROUTINE W REFLEX MICROSCOPIC
BILIRUBIN URINE: NEGATIVE
GLUCOSE, UA: 250 mg/dL — AB
Ketones, ur: NEGATIVE mg/dL
Leukocytes, UA: NEGATIVE
NITRITE: NEGATIVE
PH: 6.5 (ref 5.0–8.0)
Protein, ur: >300 mg/dL — AB
SPECIFIC GRAVITY, URINE: 1.019 (ref 1.005–1.030)

## 2015-12-30 LAB — COMPREHENSIVE METABOLIC PANEL
ALBUMIN: 2.5 g/dL — AB (ref 3.5–5.0)
ALT: 13 U/L — AB (ref 17–63)
AST: 14 U/L — AB (ref 15–41)
Alkaline Phosphatase: 55 U/L (ref 38–126)
Anion gap: 13 (ref 5–15)
BUN: 70 mg/dL — ABNORMAL HIGH (ref 6–20)
CHLORIDE: 97 mmol/L — AB (ref 101–111)
CO2: 26 mmol/L (ref 22–32)
CREATININE: 9.47 mg/dL — AB (ref 0.61–1.24)
Calcium: 9.1 mg/dL (ref 8.9–10.3)
GFR calc non Af Amer: 6 mL/min — ABNORMAL LOW (ref >60)
GFR, EST AFRICAN AMERICAN: 7 mL/min — AB (ref >60)
GLUCOSE: 159 mg/dL — AB (ref 65–99)
Potassium: 3.6 mmol/L (ref 3.5–5.1)
SODIUM: 136 mmol/L (ref 135–145)
Total Bilirubin: 0.6 mg/dL (ref 0.3–1.2)
Total Protein: 6.1 g/dL — ABNORMAL LOW (ref 6.5–8.1)

## 2015-12-30 LAB — LIPASE, BLOOD: LIPASE: 40 U/L (ref 11–51)

## 2015-12-30 LAB — ALBUMIN, FLUID (OTHER): Albumin, Fluid: 1.9 g/dL

## 2015-12-30 LAB — LACTATE DEHYDROGENASE, PLEURAL OR PERITONEAL FLUID: LD, Fluid: 594 U/L — ABNORMAL HIGH (ref 3–23)

## 2015-12-30 LAB — PROTEIN, BODY FLUID: Total protein, fluid: 3.7 g/dL

## 2015-12-30 LAB — I-STAT CG4 LACTIC ACID, ED
Lactic Acid, Venous: 0.86 mmol/L (ref 0.5–1.9)
Lactic Acid, Venous: 0.86 mmol/L (ref 0.5–1.9)

## 2015-12-30 LAB — GLUCOSE, CAPILLARY: GLUCOSE-CAPILLARY: 151 mg/dL — AB (ref 65–99)

## 2015-12-30 LAB — GLUCOSE, PERITONEAL FLUID: GLUCOSE, PERITONEAL FLUID: 113 mg/dL

## 2015-12-30 MED ORDER — DEXTROSE 5 % IV SOLN
2.0000 g | Freq: Once | INTRAVENOUS | Status: AC
  Administered 2015-12-30: 2 g via INTRAVENOUS
  Filled 2015-12-30: qty 2

## 2015-12-30 MED ORDER — GENTAMICIN SULFATE 0.1 % EX CREA
1.0000 "application " | TOPICAL_CREAM | Freq: Every day | CUTANEOUS | Status: DC
  Administered 2015-12-31: 1 via TOPICAL
  Filled 2015-12-30: qty 15

## 2015-12-30 MED ORDER — IOPAMIDOL (ISOVUE-300) INJECTION 61%
INTRAVENOUS | Status: AC
  Administered 2015-12-30: 100 mL
  Filled 2015-12-30: qty 100

## 2015-12-30 MED ORDER — DELFLEX-LC/1.5% DEXTROSE 346 MOSM/L IP SOLN
INTRAPERITONEAL | Status: DC
  Administered 2016-01-01: 5000 mL via INTRAPERITONEAL

## 2015-12-30 MED ORDER — DELFLEX-LC/2.5% DEXTROSE 394 MOSM/L IP SOLN
INTRAPERITONEAL | Status: DC
  Administered 2015-12-31 – 2016-01-01 (×2): 10000 mL via INTRAPERITONEAL

## 2015-12-30 MED ORDER — ONDANSETRON HCL 4 MG/2ML IJ SOLN
4.0000 mg | Freq: Once | INTRAMUSCULAR | Status: AC
  Administered 2015-12-30: 4 mg via INTRAVENOUS
  Filled 2015-12-30: qty 2

## 2015-12-30 MED ORDER — MORPHINE SULFATE (PF) 4 MG/ML IV SOLN
4.0000 mg | Freq: Once | INTRAVENOUS | Status: AC
  Administered 2015-12-30: 4 mg via INTRAVENOUS
  Filled 2015-12-30: qty 1

## 2015-12-30 MED ORDER — ONDANSETRON HCL 4 MG/2ML IJ SOLN
4.0000 mg | INTRAMUSCULAR | Status: AC
  Administered 2015-12-30: 4 mg via INTRAVENOUS
  Filled 2015-12-30: qty 2

## 2015-12-30 MED ORDER — VANCOMYCIN HCL 10 G IV SOLR
1500.0000 mg | Freq: Once | INTRAVENOUS | Status: AC
  Administered 2015-12-30: 1500 mg via INTRAVENOUS
  Filled 2015-12-30: qty 1500

## 2015-12-30 MED ORDER — DELFLEX-LC/1.5% DEXTROSE 346 MOSM/L IP SOLN
INTRAPERITONEAL | Status: DC
  Administered 2015-12-31 – 2016-01-01 (×2): 5000 mL via INTRAPERITONEAL

## 2015-12-30 MED ORDER — DEXTROSE 5 % IV SOLN
2.0000 g | Freq: Once | INTRAVENOUS | Status: DC
  Filled 2015-12-30: qty 2

## 2015-12-30 MED ORDER — HEPARIN 1000 UNIT/ML FOR PERITONEAL DIALYSIS
2500.0000 [IU] | INTRAMUSCULAR | Status: DC | PRN
  Filled 2015-12-30: qty 2.5

## 2015-12-30 NOTE — ED Notes (Signed)
Awaiting HD nurse to collect abdominal specimen to start ABX Per MD order.

## 2015-12-30 NOTE — ED Provider Notes (Signed)
[facility]-EMERGENCY DEPT Provider Note   CSN: [id number] Arrival date & time: 12/30/15  1604     History   Chief Complaint Chief Complaint  Patient presents with  . Abdominal Pain    HPI Austin Mcdaniel is a 45 y.o. male.  Austin Mcdaniel is a 45 y.o. Male who is a peritoneal dialysis patient who presents to the emergency department complaining of abdominal pain and fever. Patient reports his abdominal pain began yesterday with some associated nausea and vomiting. One episode of vomiting today. He reports lower abdominal pain that is worse on the right lower side. He tells me he skipped his peritoneal dialysis yesterday due to pain when trying to complete this. He reports his fluid was cloudy which is unusual for him. He is been on peritoneal dialysis since January of this year. He reports one history of SBP previously. He reports this feels somewhat similar. He reports a fever for him at 98.5. He reports his normal temperature is 96. He is followed by Dr. Lacy Duverney at [facility].  He denies hematemesis, hematochezia, rashes, chest pain, shortness of breath, lightheadedness, dizziness, syncope or sick contacts.    The history is provided by the patient. No language interpreter was used.  Abdominal Pain   Associated symptoms include fever, diarrhea, nausea and vomiting. Pertinent negatives include dysuria and headaches.    Past Medical History:  Diagnosis Date  . Acid reflux   . Anemia   . Chronic kidney disease    CKD STAGE 4  . Diabetic foot ulcer (HCC) 06/2011; 10/2011-12/2011   left dorsal foot  (01/03/2012)  . Hypertension   . Osteomyelitis of left foot (HCC)   . Peritoneal dialysis status (HCC)   . Renal insufficiency   . Spinal headache   . Type II diabetes mellitus [facility])     Patient Active Problem List   Diagnosis Date Noted  . Peritonitis (HCC) 12/31/2015  . Bacterial peritonitis (HCC) 12/30/2015  . Chronic kidney disease (CKD), stage IV (severe) (HCC) 05/24/2014  .  Vision changes 08/22/2013  . Nephropathy 10/06/2012  . Abnormal presence of protein in urine 08/17/2012  . Abnormality of gait 06/01/2012  . Insomnia 05/12/2012  . S/P BKA (below knee amputation) unilateral (HCC) 01/12/2012  . HTN (hypertension) 06/25/2010  . HLD (hyperlipidemia) 06/25/2010  . History of tobacco abuse 06/25/2010  . DM type 2 causing vascular disease (HCC) 05/26/1994    Past Surgical History:  Procedure Laterality Date  . AMPUTATION  01/06/2012   Procedure: AMPUTATION BELOW KNEE;  Surgeon: Toni Arthurs, MD;  Location: [facility] OR;  Service: Orthopedics;  Laterality: Left;  DR.HEWITT WOULD LIKE TO FOLLOW HIS OTHER AFTERNOON CASE STARTING AROUND 1700-1715  . AV FISTULA PLACEMENT Left 08/15/2014   Procedure: RADIOCEPHALIC ARTERIOVENOUS Left FISTULA CREATION;  Surgeon: Chuck Hint, MD;  Location: [facility] OR;  Service: Vascular;  Laterality: Left;  . INSERTION OF DIALYSIS CATHETER N/A 08/15/2014   Procedure: INSERTION OF DIALYSIS CATHETER;  Surgeon: Chuck Hint, MD;  Location: [facility] OR;  Service: Vascular;  Laterality: N/A;  . TOE AMPUTATION  2009; 2012?   left great; left 2nd toe       Home Medications    Prior to Admission medications   Medication Sig Start Date End Date Taking? Authorizing Provider  aspirin EC 81 MG tablet Take 81 mg by mouth daily.   Yes Historical Provider, MD  carvedilol (COREG) 12.5 MG tablet Take 12.5 mg by mouth 2 (two) times daily. 03/01/15  Yes Historical Provider, MD  ibuprofen (ADVIL,MOTRIN) 200 MG tablet Take 400 mg by mouth every 6 (six) hours as needed for headache (pain).   Yes Historical Provider, MD  Insulin Lispro Prot & Lispro (HUMALOG MIX 75/25 KWIKPEN) (75-25) 100 UNIT/ML Kwikpen Inject 12 Units into the skin 2 (two) times daily. Patient taking differently: Inject 10 Units into the skin 2 (two) times daily.  06/02/14  Yes Gust Rung, DO  isosorbide mononitrate (IMDUR) 30 MG 24 hr tablet Take 30 mg by mouth daily. 03/01/15  Yes  Historical Provider, MD  multivitamin (RENA-VIT) TABS tablet Take 1 tablet by mouth daily.   Yes Historical Provider, MD  sevelamer carbonate (RENVELA) 800 MG tablet Take 1,600-4,000 mg by mouth See admin instructions. Take 5 tablets (4000 mg) by mouth with meals and 2 tablets (1600 mg) with snacks   Yes Historical Provider, MD  simvastatin (ZOCOR) 40 MG tablet Take 40 mg by mouth at bedtime.   Yes Historical Provider, MD  temazepam (RESTORIL) 15 MG capsule Take 15 mg by mouth at bedtime as needed for sleep.  12/04/15  Yes Historical Provider, MD  Blood Glucose Monitoring Suppl (RELION PRIME MONITOR) DEVI 1 Units by Does not apply route 3 (three) times daily before meals. 06/02/14   Gust Rung, DO  glucose blood (RELION PRIME TEST) test strip Use as instructed 06/02/14   Gust Rung, DO  hydrALAZINE (APRESOLINE) 25 MG tablet Take 25 mg by mouth 2 (two) times daily. 03/01/15   Historical Provider, MD  HYDROcodone-acetaminophen (NORCO) 5-325 MG per tablet Take 1 tablet by mouth every 6 (six) hours as needed for moderate pain. Patient not taking: Reported on 12/30/2015 08/15/14   Lars Mage, PA-C  Insulin Pen Needle 31G X 5 MM MISC 1 Units by Does not apply route as needed. 06/02/14   Gust Rung, DO  labetalol (NORMODYNE) 100 MG tablet Take 1 tablet (100 mg total) by mouth 2 (two) times daily. Patient not taking: Reported on 12/30/2015 06/02/14   Gust Rung, DO  oxyCODONE-acetaminophen (ROXICET) 5-325 MG per tablet Take 1 tablet by mouth every 6 (six) hours as needed for severe pain. Patient not taking: Reported on 12/30/2015 08/15/14   Raymond Gurney, PA-C  pravastatin (PRAVACHOL) 40 MG tablet Take 1 tablet (40 mg total) by mouth daily. Patient not taking: Reported on 12/30/2015 06/02/14   Gust Rung, DO  RELION LANCETS THIN 26G MISC 1 Units by Does not apply route 3 (three) times daily before meals. 06/02/14   Gust Rung, DO    Family History Family History  Problem Relation Age of  Onset  . Hypertension Other     Social History Social History  Substance Use Topics  . Smoking status: Light Tobacco Smoker    Packs/day: 0.10    Years: 7.00    Types: Cigars  . Smokeless tobacco: Current User    Types: Snuff     Comment: 01/03/2012 "quit cigarettes 15-20 yr ago; occasionally smoke a pipe; dip rarely - last time ~ 3 months ago"  07/2013 "occasional cigar"  . Alcohol use 1.2 oz/week    2 Cans of beer per week     Comment: social     Allergies   Norvasc [amlodipine besylate]   Review of Systems Review of Systems  Constitutional: Positive for fever.  HENT: Negative for congestion and sore throat.   Eyes: Negative for visual disturbance.  Respiratory: Negative for cough and shortness of breath.   Cardiovascular: Negative for chest  pain.  Gastrointestinal: Positive for abdominal pain, diarrhea, nausea and vomiting. Negative for blood in stool.  Genitourinary: Negative for dysuria.  Musculoskeletal: Negative for back pain and neck pain.  Skin: Negative for rash.  Neurological: Negative for light-headedness and headaches.     Physical Exam Updated Vital Signs BP (!) 145/92 (BP Location: Right Arm)   Pulse 71   Temp 98.3 F (36.8 C) (Oral)   Resp 20   Ht 6\' 1"  (1.854 m)   Wt 115.8 kg   SpO2 90%   BMI 33.68 kg/m   Physical Exam  Constitutional: He appears well-developed and well-nourished. No distress.  HENT:  Head: Normocephalic and atraumatic.  Mouth/Throat: Oropharynx is clear and moist.  Eyes: Conjunctivae are normal. Pupils are equal, round, and reactive to light. Right eye exhibits no discharge. Left eye exhibits no discharge.  Neck: Neck supple.  Cardiovascular: Normal rate, regular rhythm, normal heart sounds and intact distal pulses.   Pulmonary/Chest: Effort normal and breath sounds normal. No respiratory distress. He has no wheezes. He has no rales.  Abdominal: Soft. Bowel sounds are normal. He exhibits no distension and no mass. There is  tenderness. There is guarding. There is no rebound.  Abdomen is soft. Bowel sounds are present. Patient has right lower quadrant tenderness to palpation with guarding. Peritoneal dialysis shunt is in place to his left abdomen and appears clean and dry.  Musculoskeletal: He exhibits no edema.  Lymphadenopathy:    He has no cervical adenopathy.  Neurological: He is alert. Coordination normal.  Skin: Skin is warm and dry. Capillary refill takes less than 2 seconds. No rash noted. He is not diaphoretic. No erythema. No pallor.  Psychiatric: He has a normal mood and affect. His behavior is normal.  Nursing note and vitals reviewed.    ED Treatments / Results  Labs (all labs ordered are listed, but only abnormal results are displayed) Labs Reviewed  COMPREHENSIVE METABOLIC PANEL - Abnormal; Notable for the following:       Result Value   Chloride 97 (*)    Glucose, Bld 159 (*)    BUN 70 (*)    Creatinine, Ser 9.47 (*)    Total Protein 6.1 (*)    Albumin 2.5 (*)    AST 14 (*)    ALT 13 (*)    GFR calc non Af Amer 6 (*)    GFR calc Af Amer 7 (*)    All other components within normal limits  URINALYSIS, ROUTINE W REFLEX MICROSCOPIC (NOT AT [facility]) - Abnormal; Notable for the following:    Glucose, UA 250 (*)    Hgb urine dipstick SMALL (*)    Protein, ur >300 (*)    All other components within normal limits  CBC WITH DIFFERENTIAL/PLATELET - Abnormal; Notable for the following:    WBC 11.3 (*)    RBC 3.92 (*)    Hemoglobin 12.2 (*)    HCT 35.8 (*)    Neutro Abs 8.8 (*)    All other components within normal limits  BODY FLUID CELL COUNT WITH DIFFERENTIAL - Abnormal; Notable for the following:    Color, Fluid STRAW (*)    Appearance, Fluid CLOUDY (*)    WBC, Fluid 41,200 (*)    Neutrophil Count, Fluid 91 (*)    Monocyte-Macrophage-Serous Fluid 4 (*)    All other components within normal limits  LACTATE DEHYDROGENASE, BODY FLUID - Abnormal; Notable for the following:    LD, Fluid  594 (*)  All other components within normal limits  URINE MICROSCOPIC-ADD ON - Abnormal; Notable for the following:    Squamous Epithelial / LPF 0-5 (*)    Bacteria, UA RARE (*)    All other components within normal limits  GLUCOSE, CAPILLARY - Abnormal; Notable for the following:    Glucose-Capillary 151 (*)    All other components within normal limits  CULTURE, BODY FLUID-BOTTLE  CULTURE, BLOOD (ROUTINE X 2)  CULTURE, BLOOD (ROUTINE X 2)  GRAM STAIN  LIPASE, BLOOD  GLUCOSE, PERITONEAL FLUID  PROTEIN, BODY FLUID  ALBUMIN, FLUID  BASIC METABOLIC PANEL  CBC  I-STAT CG4 LACTIC ACID, ED  I-STAT CG4 LACTIC ACID, ED    EKG  EKG Interpretation None       Radiology Ct Abdomen Pelvis W Contrast  Result Date: 12/30/2015 CLINICAL DATA:  Right lower quadrant abdominal pain since last night. EXAM: CT ABDOMEN AND PELVIS WITH CONTRAST TECHNIQUE: Multidetector CT imaging of the abdomen and pelvis was performed using the standard protocol following bolus administration of intravenous contrast. CONTRAST:  ISOVUE-300 IOPAMIDOL (ISOVUE-300) INJECTION 61% COMPARISON:  None. FINDINGS: Lower chest:  No contributory findings. Hepatobiliary: No focal liver abnormality.No evidence of biliary obstruction or stone. Pancreas: Unremarkable. Spleen: Unremarkable. Adrenals/Urinary Tract: Negative adrenals. Minimally enhancing kidneys in this patient with end-stage renal disease. No hydronephrosis or stone. Unremarkable bladder. Stomach/Bowel:  No obstruction. No appendicitis. Vascular/Lymphatic: No acute vascular abnormality. Diffuse arterial calcification. No mass or adenopathy. Reproductive:History of diabetes with vas deferens calcifications. Other: Small ascites and randomly distributed pneumoperitoneum in this patient with Tenckhoff catheter that is positioned in the ventral low pelvis. Musculoskeletal: No acute or aggressive finding. IMPRESSION: 1. No appendicitis or other acute finding. 2. Small  ascites and pneumoperitoneum as expected in this patient on peritoneal dialysis. Electronically Signed   By: Marnee Spring M.D.   On: 12/30/2015 19:48    Procedures Procedures (including critical care time)  Medications Ordered in ED Medications  heparin 1000 unit/ml injection 2,500 Units (not administered)  gentamicin cream (GARAMYCIN) 0.1 % 1 application (not administered)  dialysis solution 1.5% low-MG/low-CA dianeal solution (5,000 mLs Intraperitoneal Given 12/31/15 0242)  dialysis solution 2.5% low-MG/low-CA dianeal solution (10,000 mLs Intraperitoneal Given 12/31/15 0242)  dialysis solution 1.5% low-MG/low-CA dianeal solution ( Intraperitoneal Duplicate 12/30/15 2300)  aspirin EC tablet 81 mg (not administered)  multivitamin (RENA-VIT) tablet 1 tablet (not administered)  sevelamer carbonate (RENVELA) tablet 4,000 mg (not administered)  simvastatin (ZOCOR) tablet 40 mg (40 mg Oral Given 12/31/15 0117)  temazepam (RESTORIL) capsule 15 mg (15 mg Oral Given 12/31/15 0117)  carvedilol (COREG) tablet 12.5 mg (not administered)  hydrALAZINE (APRESOLINE) tablet 25 mg (25 mg Oral Given 12/31/15 0117)  isosorbide mononitrate (IMDUR) 24 hr tablet 30 mg (not administered)  hydrALAZINE (APRESOLINE) injection 10 mg (not administered)  acetaminophen (TYLENOL) tablet 650 mg (not administered)    Or  acetaminophen (TYLENOL) suppository 650 mg (not administered)  ondansetron (ZOFRAN) tablet 4 mg (not administered)    Or  ondansetron (ZOFRAN) injection 4 mg (not administered)  insulin aspart (novoLOG) injection 0-9 Units (not administered)  heparin injection 5,000 Units (5,000 Units Subcutaneous Not Given 12/31/15 0100)  morphine 2 MG/ML injection 1 mg (1 mg Intravenous Given 12/31/15 0242)  sevelamer carbonate (RENVELA) tablet 1,600 mg (not administered)  vancomycin (VANCOCIN) 2,500 mg in sodium chloride 0.9 % 500 mL IVPB (2,500 mg Intravenous Given 12/31/15 0243)  piperacillin-tazobactam (ZOSYN) IVPB  2.25 g (2.25 g Intravenous Given 12/31/15 0243)  ondansetron (ZOFRAN) injection 4  mg (4 mg Intravenous Given 12/30/15 1811)  morphine 4 MG/ML injection 4 mg (4 mg Intravenous Given 12/30/15 1811)  iopamidol (ISOVUE-300) 61 % injection (100 mLs  Contrast Given 12/30/15 1916)  cefTAZidime (FORTAZ) 2 g in dextrose 5 % 50 mL IVPB (0 g Intravenous Stopped 12/30/15 2200)  vancomycin (VANCOCIN) 1,500 mg in sodium chloride 0.9 % 500 mL IVPB (1,500 mg Intravenous Transfusing/Transfer 12/30/15 2339)  morphine 4 MG/ML injection 4 mg (4 mg Intravenous Given 12/30/15 2200)  ondansetron (ZOFRAN) injection 4 mg (4 mg Intravenous Given 12/30/15 2200)     Initial Impression / Assessment and Plan / ED Course  I have reviewed the triage vital signs and the nursing notes.  Pertinent labs & imaging results that were available during my care of the patient were reviewed by me and considered in my medical decision making (see chart for details).  Clinical Course    This is a 45 y.o. Male who is a peritoneal dialysis patient who presents to the emergency department complaining of abdominal pain and fever. Patient reports his abdominal pain began yesterday with some associated nausea and vomiting. One episode of vomiting today. He reports lower abdominal pain that is worse on the right lower side. He tells me he skipped his peritoneal dialysis yesterday due to pain when trying to complete this. He reports his fluid was cloudy which is unusual for him. He is been on peritoneal dialysis since January of this year. He reports one history of SBP previously. He reports this feels somewhat similar. He reports a fever for him at 98.5. He reports his normal temperature is 96. [place] [place] [facility]. On exam the patient is afebrile nontoxic appearing. He has tenderness to his right lower quadrant. Peritoneal dialysis shunt is in place and appears clean and dry. We will initiate workup for abdominal pain as  well as for bacterial peritonitis. Will initiate empiric antibiotic coverage for this. Will also obtain CT.  I consulted with nephrologist Dr. Arlean HoppingSchertz who will have dialysis RN come down to draw peritoneal fluid for cultures and recommends vancomycin and Fortaz for antibiotic treatment. Lactic acid is not elevated. CMP is consistent with the patient on dialysis. Normal sodium and potassium. CBCs are marked for white count of 11,300. CT abdomen and pelvis shows no appendicitis or acute finding. Following for peritoneal fluid analysis patient reports feeling much better after nausea and pain medicine. Peritoneal fluid cell count shows a white count of 41,200 with an absolute neutrophil count of 91%. Will admit for bacterial peritonitis. I consulted with hospitalist Dr. Toniann FailKakrakandy who accepted the patient for admission. He would like me to consult again with nephrology if the patient needs peritoneal antibiotics. I consulted again with nephrologist Dr. Arlean HoppingSchertz who reports he will put in antibiotics for the patient and will see him in the morning.   This patient was discussed with Dr. Dalene SeltzerSchlossman who agrees with assessment and plan.   Final Clinical Impressions(s) / ED Diagnoses   Final diagnoses:  Bacterial peritonitis (HCC)  Non-intractable vomiting with nausea, unspecified vomiting type    New Prescriptions Current Discharge Medication List       Everlene FarrierWilliam Tenzin Edelman, PA-C 12/31/15 [id number]    Alvira MondayErin Schlossman, MD 12/31/15 1259

## 2015-12-30 NOTE — ED Triage Notes (Signed)
The pt is a peritoneal dialysis pt  And he has not done any exchanges for 2 days.  He has abd pain n v elevated temp also  He has been feeling too sick to do his exchanges

## 2015-12-30 NOTE — ED Notes (Signed)
Second iv attempted without success.;

## 2015-12-30 NOTE — ED Notes (Signed)
Patient transported to CT 

## 2015-12-31 ENCOUNTER — Encounter (HOSPITAL_COMMUNITY): Payer: Self-pay | Admitting: Internal Medicine

## 2015-12-31 DIAGNOSIS — E1159 Type 2 diabetes mellitus with other circulatory complications: Secondary | ICD-10-CM

## 2015-12-31 DIAGNOSIS — K659 Peritonitis, unspecified: Secondary | ICD-10-CM | POA: Diagnosis present

## 2015-12-31 DIAGNOSIS — I10 Essential (primary) hypertension: Secondary | ICD-10-CM

## 2015-12-31 LAB — CBC
HCT: 30.5 % — ABNORMAL LOW (ref 39.0–52.0)
HEMOGLOBIN: 10.1 g/dL — AB (ref 13.0–17.0)
MCH: 30.9 pg (ref 26.0–34.0)
MCHC: 33.1 g/dL (ref 30.0–36.0)
MCV: 93.3 fL (ref 78.0–100.0)
Platelets: 195 10*3/uL (ref 150–400)
RBC: 3.27 MIL/uL — ABNORMAL LOW (ref 4.22–5.81)
RDW: 13.5 % (ref 11.5–15.5)
WBC: 10.8 10*3/uL — ABNORMAL HIGH (ref 4.0–10.5)

## 2015-12-31 LAB — BASIC METABOLIC PANEL
ANION GAP: 13 (ref 5–15)
BUN: 65 mg/dL — ABNORMAL HIGH (ref 6–20)
CALCIUM: 8.7 mg/dL — AB (ref 8.9–10.3)
CO2: 24 mmol/L (ref 22–32)
CREATININE: 9.18 mg/dL — AB (ref 0.61–1.24)
Chloride: 96 mmol/L — ABNORMAL LOW (ref 101–111)
GFR calc Af Amer: 7 mL/min — ABNORMAL LOW (ref >60)
GFR calc non Af Amer: 6 mL/min — ABNORMAL LOW (ref >60)
GLUCOSE: 205 mg/dL — AB (ref 65–99)
Potassium: 3.7 mmol/L (ref 3.5–5.1)
Sodium: 133 mmol/L — ABNORMAL LOW (ref 135–145)

## 2015-12-31 LAB — GLUCOSE, CAPILLARY
GLUCOSE-CAPILLARY: 255 mg/dL — AB (ref 65–99)
GLUCOSE-CAPILLARY: 154 mg/dL — AB (ref 65–99)
GLUCOSE-CAPILLARY: 178 mg/dL — AB (ref 65–99)
Glucose-Capillary: 207 mg/dL — ABNORMAL HIGH (ref 65–99)

## 2015-12-31 LAB — PATHOLOGIST SMEAR REVIEW

## 2015-12-31 LAB — GRAM STAIN

## 2015-12-31 MED ORDER — SIMVASTATIN 40 MG PO TABS
40.0000 mg | ORAL_TABLET | Freq: Every day | ORAL | Status: DC
  Administered 2015-12-31 (×2): 40 mg via ORAL
  Filled 2015-12-31 (×2): qty 1

## 2015-12-31 MED ORDER — ONDANSETRON HCL 4 MG PO TABS
4.0000 mg | ORAL_TABLET | Freq: Four times a day (QID) | ORAL | Status: DC | PRN
  Administered 2015-12-31: 4 mg via ORAL
  Filled 2015-12-31 (×2): qty 1

## 2015-12-31 MED ORDER — VANCOMYCIN HCL 10 G IV SOLR
2500.0000 mg | Freq: Once | INTRAVENOUS | Status: AC
  Administered 2015-12-31: 2500 mg via INTRAVENOUS
  Filled 2015-12-31: qty 2500

## 2015-12-31 MED ORDER — RENA-VITE PO TABS
1.0000 | ORAL_TABLET | Freq: Every day | ORAL | Status: DC
  Administered 2015-12-31: 1 via ORAL
  Filled 2015-12-31: qty 1

## 2015-12-31 MED ORDER — PIPERACILLIN-TAZOBACTAM IN DEX 2-0.25 GM/50ML IV SOLN
2.2500 g | Freq: Three times a day (TID) | INTRAVENOUS | Status: DC
  Administered 2015-12-31 – 2016-01-01 (×4): 2.25 g via INTRAVENOUS
  Filled 2015-12-31 (×8): qty 50

## 2015-12-31 MED ORDER — SEVELAMER CARBONATE 800 MG PO TABS: 1600.0000 mg | ORAL_TABLET | ORAL | Status: DC | PRN

## 2015-12-31 MED ORDER — ONDANSETRON HCL 4 MG/2ML IJ SOLN
4.0000 mg | Freq: Four times a day (QID) | INTRAMUSCULAR | Status: DC | PRN
  Administered 2016-01-01: 4 mg via INTRAVENOUS
  Filled 2015-12-31: qty 2

## 2015-12-31 MED ORDER — HEPARIN SODIUM (PORCINE) 5000 UNIT/ML IJ SOLN
5000.0000 [IU] | Freq: Three times a day (TID) | INTRAMUSCULAR | Status: DC
  Administered 2015-12-31 – 2016-01-01 (×3): 5000 [IU] via SUBCUTANEOUS
  Filled 2015-12-31 (×3): qty 1

## 2015-12-31 MED ORDER — TEMAZEPAM 15 MG PO CAPS
15.0000 mg | ORAL_CAPSULE | Freq: Every evening | ORAL | Status: DC | PRN
  Administered 2015-12-31: 15 mg via ORAL
  Filled 2015-12-31: qty 1

## 2015-12-31 MED ORDER — ACETAMINOPHEN 325 MG PO TABS
650.0000 mg | ORAL_TABLET | Freq: Four times a day (QID) | ORAL | Status: DC | PRN
  Administered 2015-12-31: 650 mg via ORAL
  Filled 2015-12-31: qty 2

## 2015-12-31 MED ORDER — HYDRALAZINE HCL 20 MG/ML IJ SOLN: 10.0000 mg | INTRAMUSCULAR | Status: DC | PRN

## 2015-12-31 MED ORDER — ISOSORBIDE MONONITRATE ER 30 MG PO TB24
30.0000 mg | ORAL_TABLET | Freq: Every day | ORAL | Status: DC
  Administered 2015-12-31 – 2016-01-01 (×2): 30 mg via ORAL
  Filled 2015-12-31 (×2): qty 1

## 2015-12-31 MED ORDER — INSULIN ASPART 100 UNIT/ML ~~LOC~~ SOLN
0.0000 [IU] | Freq: Three times a day (TID) | SUBCUTANEOUS | Status: DC
  Administered 2015-12-31: 5 [IU] via SUBCUTANEOUS
  Administered 2015-12-31: 3 [IU] via SUBCUTANEOUS
  Administered 2015-12-31: 2 [IU] via SUBCUTANEOUS
  Administered 2016-01-01: 5 [IU] via SUBCUTANEOUS
  Administered 2016-01-01: 3 [IU] via SUBCUTANEOUS

## 2015-12-31 MED ORDER — RENA-VITE PO TABS: 1.0000 | ORAL_TABLET | Freq: Every day | ORAL | Status: DC

## 2015-12-31 MED ORDER — SUCROFERRIC OXYHYDROXIDE 500 MG PO CHEW
500.0000 mg | CHEWABLE_TABLET | Freq: Three times a day (TID) | ORAL | Status: DC
  Administered 2015-12-31 – 2016-01-01 (×2): 500 mg via ORAL
  Filled 2015-12-31 (×4): qty 1

## 2015-12-31 MED ORDER — HYDRALAZINE HCL 25 MG PO TABS
25.0000 mg | ORAL_TABLET | Freq: Two times a day (BID) | ORAL | Status: DC
  Administered 2015-12-31 – 2016-01-01 (×3): 25 mg via ORAL
  Filled 2015-12-31 (×4): qty 1

## 2015-12-31 MED ORDER — SEVELAMER CARBONATE 800 MG PO TABS
4000.0000 mg | ORAL_TABLET | Freq: Three times a day (TID) | ORAL | Status: DC
  Administered 2015-12-31 – 2016-01-01 (×4): 4000 mg via ORAL
  Filled 2015-12-31 (×5): qty 5

## 2015-12-31 MED ORDER — ACETAMINOPHEN 650 MG RE SUPP: 650.0000 mg | Freq: Four times a day (QID) | RECTAL | Status: DC | PRN

## 2015-12-31 MED ORDER — CARVEDILOL 12.5 MG PO TABS
12.5000 mg | ORAL_TABLET | Freq: Two times a day (BID) | ORAL | Status: DC
  Administered 2015-12-31 – 2016-01-01 (×3): 12.5 mg via ORAL
  Filled 2015-12-31 (×3): qty 1

## 2015-12-31 MED ORDER — MORPHINE SULFATE (PF) 2 MG/ML IV SOLN
1.0000 mg | INTRAVENOUS | Status: DC | PRN
  Administered 2015-12-31: 1 mg via INTRAVENOUS
  Filled 2015-12-31: qty 1

## 2015-12-31 MED ORDER — ASPIRIN EC 81 MG PO TBEC
81.0000 mg | DELAYED_RELEASE_TABLET | Freq: Every day | ORAL | Status: DC
  Administered 2015-12-31 – 2016-01-01 (×2): 81 mg via ORAL
  Filled 2015-12-31 (×2): qty 1

## 2015-12-31 NOTE — Progress Notes (Signed)
Inpatient Diabetes Program Recommendations  AACE/ADA: New Consensus Statement on Inpatient Glycemic Control (2015)  Target Ranges:  Prepandial:   less than 140 mg/dL      Peak postprandial:   less than 180 mg/dL (1-2 hours)      Critically ill patients:  140 - 180 mg/dL   Lab Results  Component Value Date   GLUCAP 207 (H) 12/31/2015   HGBA1C 7.2 05/24/2014    Review of Glycemic Control  Results for Austin Mcdaniel, Austin Mcdaniel (MRN [id number]) as of 12/31/2015 14:41  Ref. Range 12/31/2015 00:00 12/31/2015 07:54 12/31/2015 12:04  Glucose-Capillary Latest Ref Range: 65 - 99 mg/dL 562151 (H) 130255 (H) 865207 (H)    Diabetes history: Type 2  Outpatient Diabetes medications: Humalog mix 75/25 insulin 12 units bid  Current orders for Inpatient glycemic control: Novolog sensitive correction tid  Inpatient Diabetes Program Recommendations: Consider adding low dose basal insulin Lantus 9 units qhs starting tonight (takes 18 units basal plus 6 units mealtime at home).   Susette RacerJulie Preeya Cleckley, RN, BA, MHA, CDE Diabetes Coordinator Inpatient Diabetes Program  [phone] (Team Pager) [phone] [facility] Office) 12/31/2015 2:56 PM

## 2015-12-31 NOTE — Progress Notes (Signed)
[facility] PROGRESS NOTE  Austin HedgeSteven Mcdaniel ZOX:[id number] DOB: 07-Aug-1970 DOA: 12/30/2015  PCP: No PCP Per Patient  Used to be the internal medicine teaching service, but no longer per the resident.  Brief History/Interval Summary: 45 year old Caucasian male with a past medical history of end-stage renal disease on dialysis since July 2016, initially on hemodialysis and now on peritoneal dialysis since January 2017, history of coronary artery disease status [facility], hypertension, hyperlipidemia and diabetes, presented with complaints of abdominal pain, nausea and vomiting. Patient was diagnosed as having peritonitis and was hospitalized for further management.  Reason for Visit: Bacterial peritonitis  Consultants: Nephrology  Procedures: Peritoneal dialysis. Paracentesis  Antibiotics: Currently on vancomycin and Zosyn  Subjective/Interval History: Patient states that he feels better this morning. Abdominal pain, nausea and vomiting has improved. He hasn't vomited since he has been up on the floor. Requesting solid food. Denies any chest pain or shortness of breath.  ROS: Denies headaches. No chills.  Objective:  Vital Signs  Vitals:   12/30/15 2230 12/30/15 2354 12/31/15 0547 12/31/15 0800  BP: 158/90 (!) 145/92 126/74 (!) 151/95  Pulse: 69 71 79 72  Resp:  20 18 18   Temp:  98.3 F (36.8 C) 98.2 F (36.8 C)   TempSrc:  Oral Oral   SpO2: 94% 90% (!) 89% 96%  Weight:  115.8 kg (255 lb 4.7 oz)    Height:  6\' 1"  (1.854 m)      Intake/Output Summary (Last 24 hours) at 12/31/15 1044 Last data filed at 12/31/15 1000  Gross per 24 hour  Intake             4270 ml  Output                0 ml  Net             4270 ml   Filed Weights   12/30/15 1627 12/30/15 2354  Weight: 111.1 kg (245 lb) 115.8 kg (255 lb 4.7 oz)    General appearance: alert, cooperative, appears stated age and no distress Resp: clear to auscultation  bilaterally Cardio: regular rate and rhythm, S1, S2 normal, no murmur, click, rub or gallop GI: Abdomen is soft. Peritoneal dialysis catheter is noted. Mildly tender diffusely without any rebound, rigidity or guarding. I'll sounds are present. No masses or organomegaly. Extremities: extremities normal, atraumatic, no cyanosis or edema Neurologic: Awake and alert. Oriented 3. No facial asymmetry. Motor strength equal bilateral upper and lower extremities.  Lab Results:  Data Reviewed: I have personally reviewed following labs and imaging studies  CBC:  Recent Labs Lab 12/30/15 1720 12/31/15 0659  WBC 11.3* 10.8*  NEUTROABS 8.8*  --   HGB 12.2* 10.1*  HCT 35.8* 30.5*  MCV 91.3 93.3  PLT 184 195    Basic Metabolic Panel:  Recent Labs Lab 12/30/15 1720 12/31/15 0659  NA 136 133*  K 3.6 3.7  CL 97* 96*  CO2 26 24  GLUCOSE 159* 205*  BUN 70* 65*  CREATININE 9.47* 9.18*  CALCIUM 9.1 8.7*    GFR: Estimated Creatinine Clearance: 13.6 mL/min (by C-G formula based on SCr of 9.18 mg/dL (H)).  Liver Function Tests:  Recent Labs Lab 12/30/15 1720  AST 14*  ALT 13*  ALKPHOS 55  BILITOT 0.6  PROT 6.1*  ALBUMIN 2.5*     Recent Labs Lab 12/30/15 1720  LIPASE 40    CBG:  Recent Labs Lab 12/31/15 0000 12/31/15 0754  GLUCAP 151* 255*     Recent Results (from the past 240 hour(s))  Stat Gram stain     Status: None   Collection Time: 12/30/15  8:09 PM  Result Value Ref Range Status   Specimen Description PERITONEAL FLUID  Final   Special Requests NONE  Final   Gram Stain   Final    ABUNDANT WBC PRESENT,BOTH PMN AND MONONUCLEAR FEW GRAM POSITIVE COCCI IN CLUSTERS IN PAIRS CRITICAL RESULT CALLED TO, READ BACK BY AND VERIFIED WITH: K.GENGLER,RN 0981 12/31/15 G.MCADOO    Report Status 12/31/2015 FINAL  Final  Culture, body fluid-bottle     Status: None (Preliminary result)   Collection Time: 12/30/15  8:09 PM  Result Value Ref Range Status   Specimen  Description BLOOD PERITONEAL  Final   Special Requests BOTTLES DRAWN AEROBIC ONLY 5CC  Final   Gram Stain   Final    GRAM POSITIVE COCCI IN PAIRS IN CLUSTERS AEROBIC BOTTLE ONLY CONSISTENT WITH ORIGINAL GRAM STAIN RESULTS    Culture PENDING  Incomplete   Report Status PENDING  Incomplete      Radiology Studies: Ct Abdomen Pelvis W Contrast  Result Date: 12/30/2015 CLINICAL DATA:  Right lower quadrant abdominal pain since last night. EXAM: CT ABDOMEN AND PELVIS WITH CONTRAST TECHNIQUE: Multidetector CT imaging of the abdomen and pelvis was performed using the standard protocol following bolus administration of intravenous contrast. CONTRAST:  ISOVUE-300 IOPAMIDOL (ISOVUE-300) INJECTION 61% COMPARISON:  None. FINDINGS: Lower chest:  No contributory findings. Hepatobiliary: No focal liver abnormality.No evidence of biliary obstruction or stone. Pancreas: Unremarkable. Spleen: Unremarkable. Adrenals/Urinary Tract: Negative adrenals. Minimally enhancing kidneys in this patient with end-stage renal disease. No hydronephrosis or stone. Unremarkable bladder. Stomach/Bowel:  No obstruction. No appendicitis. Vascular/Lymphatic: No acute vascular abnormality. Diffuse arterial calcification. No mass or adenopathy. Reproductive:History of diabetes with vas deferens calcifications. Other: Small ascites and randomly distributed pneumoperitoneum in this patient with Tenckhoff catheter that is positioned in the ventral low pelvis. Musculoskeletal: No acute or aggressive finding. IMPRESSION: 1. No appendicitis or other acute finding. 2. Small ascites and pneumoperitoneum as expected in this patient on peritoneal dialysis. Electronically Signed   By: Marnee Spring M.D.   On: 12/30/2015 19:48     Medications:  Scheduled: . aspirin EC  81 mg Oral Daily  . carvedilol  12.5 mg Oral BID WC  . dialysis solution 1.5% low-MG/low-CA   Intraperitoneal Q24H  . dialysis solution 1.5% low-MG/low-CA   Intraperitoneal  Q24H  . dialysis solution 2.5% low-MG/low-CA   Intraperitoneal Q24H  . gentamicin cream  1 application Topical Daily  . heparin  5,000 Units Subcutaneous Q8H  . hydrALAZINE  25 mg Oral BID  . insulin aspart  0-9 Units Subcutaneous TID WC  . isosorbide mononitrate  30 mg Oral Daily  . multivitamin  1 tablet Oral QHS  . piperacillin-tazobactam (ZOSYN)  IV  2.25 g Intravenous Q8H  . sevelamer carbonate  4,000 mg Oral TID WC  . simvastatin  40 mg Oral QHS   Continuous:  [id number] **OR** acetaminophen, heparin, hydrALAZINE, morphine injection, ondansetron **OR** ondansetron (ZOFRAN) IV, sevelamer carbonate, temazepam  Assessment/Plan:  Principal Problem:   Bacterial peritonitis (HCC) Active Problems:   DM type 2 causing vascular disease (HCC)   HTN (hypertension)   HLD (hyperlipidemia)   S/P BKA (below knee amputation) unilateral (HCC)   Peritonitis (HCC)    Bacterial peritonitis Patient's paratonia. Fluid analysis showed significantly elevated white cells. Gram stain was also positive for gram-positive cocci.  Patient has had episode of the peritonitis previously as well. He grew Serratia back in January. Nephrology has been consulted. Patient is on peritoneal dialysis at this time. Continue vancomycin and Zosyn for now. May need antibiotics through the dialysate as well. Await culture data. Patient's symptoms of nausea, vomiting have improved. Advance diet.   History of coronary artery disease status post bypass [facility]. Stable currently. Patient denies any chest pain. Continue with beta blocker. Continue with aspirin and statin. Continue with nitrates and hydralazine. [facility] at [facility].  Diabetes mellitus type 2 Monitor CBGs. Continue sliding scale insulin coverage. If he tolerates it oral intake, we'll place him back on his usual long-acting insulin regimen. Patient takes 75/25 at home.   Essential Hypertension Monitor blood  pressures closely. Continue home medications. Hydralazine as needed.  Chronic anemia  probably from ESRD. Follow CBC.  Hyperlipidemia On statins.  History of left BKA. Stable  ESRD on peritoneal dialysis Management per nephrology.  DVT Prophylaxis: Subcutaneous heparin    Code Status: Full code  Family Communication: Discussed with the patient  Disposition Plan: Management as outlined above.    LOS: 1 day   [facility]Daaron Dimarco  [facility] Pager [phone] 12/31/2015, 10:44 AM  If 7PM-7AM, please contact night-coverage at [email or web address], password [email or web address]

## 2015-12-31 NOTE — Progress Notes (Signed)
New Admission Note:   Arrival Method:   Via stretcher from ED Mental Orientation:  A & O x 4 Telemetry: N/A Assessment: Completed Skin:  Dry.  Intact except for 0.5 scab on left BKA stump IV:  Rt Wrist NSL and Rt Hand NSL Pain: See pain assessment Tubes:  LLQ Peritoneal Dialysis Catheter Safety Measures: Safety Fall Prevention Plan has been given, discussed and signed Admission: Completed [address] [place] Orientation: Patient has been orientated to the room, unit and staff.  Family:  Significant Other - Earlene Platernna Williams - at bedside  Peritoneal Dialysis Consent obtained and treatment initiated.  Orders have been reviewed and implemented. Will continue to monitor the patient. Call light has been placed within reach and bed alarm has been activated.   Bernie CoveyKimberly Talasia Saulter RN- Marsa ArisBC, [place] Phone number: [phone]

## 2015-12-31 NOTE — H&P (Signed)
History and Physical    Austin Mcdaniel Mcdaniel:[id number] DOB: 1970-05-26 DOA: 12/30/2015  PCP: No PCP Per Patient  Patient coming from: Home.  Chief Complaint: Abdominal pain.  HPI: Austin Mcdaniel is a 45 y.o. male with history of ESRD on peritoneal dialysis, hypertension, hyperlipidemia and diabetes mellitus2 presents to the ER because of abdominal pain and nausea vomiting. Patient has been having these symptoms for the last 3 days. Pain is mostly in the right lower quadrant. Patient also has subjective feeling of fever and chills. CT of the abdomen and pelvis in the ER was not showing anything acute. Peritoneal fluid was showing WBC count of 41,000 with 90% neutrophils. Rest of peritoneal fluid labs are pending. On-call nephrologist Dr. Arlean HoppingSchertz was consulted and patient is being started on empiric antibiotics. On exam patient is not in distress.   Patient states he also had one or 2 episodes of diarrhea which was nonbloody. He has not eaten anything much because of the pain for the last 3 days.   ED Course: Patient was started on empiric antibiotics vancomycin and cefepime in the ER. Nephrology was consulted.  Review of Systems: As per HPI, rest all negative.   Past Medical History:  Diagnosis Date  . Acid reflux   . Anemia   . Chronic kidney disease    CKD STAGE 4  . Diabetic foot ulcer (HCC) 06/2011; 10/2011-12/2011   left dorsal foot  (01/03/2012)  . Hypertension   . Osteomyelitis of left foot (HCC)   . Peritoneal dialysis status (HCC)   . Renal insufficiency   . Spinal headache   . Type II diabetes mellitus (HCC)     Past Surgical History:  Procedure Laterality Date  . AMPUTATION  01/06/2012   Procedure: AMPUTATION BELOW KNEE;  Surgeon: Toni ArthursJohn Hewitt, MD;  Location: [facility] OR;  Service: Orthopedics;  Laterality: Left;  DR.HEWITT WOULD LIKE TO FOLLOW HIS OTHER AFTERNOON CASE STARTING AROUND 1700-1715  . AV FISTULA PLACEMENT Left 08/15/2014   Procedure: RADIOCEPHALIC ARTERIOVENOUS Left  FISTULA CREATION;  Surgeon: Chuck Hinthristopher S Dickson, MD;  [facility];  Service: Vascular;  Laterality: Left;  . INSERTION OF DIALYSIS CATHETER N/A 08/15/2014   Procedure: INSERTION OF DIALYSIS CATHETER;  Surgeon: Chuck Hinthristopher S Dickson, MD;  [facility];  Service: Vascular;  Laterality: N/A;  . TOE AMPUTATION  2009; 2012?   left great; left 2nd toe     reports that he has been smoking Cigars.  He has a 0.70 pack-year smoking history. His smokeless tobacco use includes Snuff. He reports that he drinks about 1.2 oz of alcohol per week . He reports that he does not use drugs.  Allergies  Allergen Reactions  . Norvasc [Amlodipine Besylate] Swelling    Leg swelling     Family History  Problem Relation Age of Onset  . Hypertension Other     Prior to Admission medications   Medication Sig Start Date End Date Taking? Authorizing Provider  aspirin EC 81 MG tablet Take 81 mg by mouth daily.   Yes Historical Provider, MD  carvedilol (COREG) 12.5 MG tablet Take 12.5 mg by mouth 2 (two) times daily. 03/01/15  Yes Historical Provider, MD  ibuprofen (ADVIL,MOTRIN) 200 MG tablet Take 400 mg by mouth every 6 (six) hours as needed for headache (pain).   Yes Historical Provider, MD  Insulin Lispro Prot & Lispro (HUMALOG MIX 75/25 KWIKPEN) (75-25) 100 UNIT/ML Kwikpen Inject 12 Units into the skin 2 (two) times daily. Patient taking differently: Inject 10  Units into the skin 2 (two) times daily.  06/02/14  Yes Gust RungErik C Hoffman, DO  isosorbide mononitrate (IMDUR) 30 MG 24 hr tablet Take 30 mg by mouth daily. 03/01/15  Yes Historical Provider, MD  multivitamin (RENA-VIT) TABS tablet Take 1 tablet by mouth daily.   Yes Historical Provider, MD  sevelamer carbonate (RENVELA) 800 MG tablet Take 1,600-4,000 mg by mouth See admin instructions. Take 5 tablets (4000 mg) by mouth with meals and 2 tablets (1600 mg) with snacks   Yes Historical Provider, MD  simvastatin (ZOCOR) 40 MG tablet Take 40 mg by mouth at bedtime.    Yes Historical Provider, MD  temazepam (RESTORIL) 15 MG capsule Take 15 mg by mouth at bedtime as needed for sleep.  12/04/15  Yes Historical Provider, MD  Blood Glucose Monitoring Suppl (RELION PRIME MONITOR) DEVI 1 Units by Does not apply route 3 (three) times daily before meals. 06/02/14   Gust RungErik C Hoffman, DO  glucose blood (RELION PRIME TEST) test strip Use as instructed 06/02/14   Gust RungErik C Hoffman, DO  hydrALAZINE (APRESOLINE) 25 MG tablet Take 25 mg by mouth 2 (two) times daily. 03/01/15   Historical Provider, MD  HYDROcodone-acetaminophen (NORCO) 5-325 MG per tablet Take 1 tablet by mouth every 6 (six) hours as needed for moderate pain. Patient not taking: Reported on 12/30/2015 08/15/14   Lars MageEmma M Collins, PA-C  Insulin Pen Needle 31G X 5 MM MISC 1 Units by Does not apply route as needed. 06/02/14   Gust RungErik C Hoffman, DO  labetalol (NORMODYNE) 100 MG tablet Take 1 tablet (100 mg total) by mouth 2 (two) times daily. Patient not taking: Reported on 12/30/2015 06/02/14   Gust RungErik C Hoffman, DO  oxyCODONE-acetaminophen (ROXICET) 5-325 MG per tablet Take 1 tablet by mouth every 6 (six) hours as needed for severe pain. Patient not taking: Reported on 12/30/2015 08/15/14   Raymond GurneyKimberly A Trinh, PA-C  pravastatin (PRAVACHOL) 40 MG tablet Take 1 tablet (40 mg total) by mouth daily. Patient not taking: Reported on 12/30/2015 06/02/14   Gust RungErik C Hoffman, DO  RELION LANCETS THIN 26G MISC 1 Units by Does not apply route 3 (three) times daily before meals. 06/02/14   Gust RungErik C Hoffman, DO    Physical Exam: Vitals:   12/30/15 2100 12/30/15 2200 12/30/15 2230 12/30/15 2354  BP: 174/84 161/84 158/90 (!) 145/92  Pulse: 71 69 69 71  Resp:    20  Temp:    98.3 F (36.8 C)  TempSrc:    Oral  SpO2: 90% 92% 94% 90%  Weight:    115.8 kg (255 lb 4.7 oz)  Height:    6\' 1"  (1.854 m)      Constitutional: Moderately built and nourished. Vitals:   12/30/15 2100 12/30/15 2200 12/30/15 2230 12/30/15 2354  BP: 174/84 161/84 158/90 (!) 145/92    Pulse: 71 69 69 71  Resp:    20  Temp:    98.3 F (36.8 C)  TempSrc:    Oral  SpO2: 90% 92% 94% 90%  Weight:    115.8 kg (255 lb 4.7 oz)  Height:    6\' 1"  (1.854 m)   Eyes: Anicteric no pallor. ENMT: No discharge from the ears eyes nose and mouth. Neck: No mass felt. No neck rigidity. Respiratory: No rhonchi or crepitations. Cardiovascular: S1 and S2 heard. No guarding or rigidity. Abdomen: Soft nontender dialysis catheter is seen. No guarding or rigidity. Musculoskeletal: No edema. No joint effusion. Left BKA. Skin: No rash. Skin  appears warm. Neurologic: Alert awake oriented to time place and person. Moves all extremities. Psychiatric: Appears normal. Normal affect.   Labs on Admission: I have personally reviewed following labs and imaging studies  CBC:  Recent Labs Lab 12/30/15 1720  WBC 11.3*  NEUTROABS 8.8*  HGB 12.2*  HCT 35.8*  MCV 91.3  PLT 184   Basic Metabolic Panel:  Recent Labs Lab 12/30/15 1720  NA 136  K 3.6  CL 97*  CO2 26  GLUCOSE 159*  BUN 70*  CREATININE 9.47*  CALCIUM 9.1   GFR: Estimated Creatinine Clearance: 13.1 mL/min (by C-G formula based on SCr of 9.47 mg/dL (H)). Liver Function Tests:  Recent Labs Lab 12/30/15 1720  AST 14*  ALT 13*  ALKPHOS 55  BILITOT 0.6  PROT 6.1*  ALBUMIN 2.5*    Recent Labs Lab 12/30/15 1720  LIPASE 40   No results for input(s): AMMONIA in the last 168 hours. Coagulation Profile: No results for input(s): INR, PROTIME in the last 168 hours. Cardiac Enzymes: No results for input(s): CKTOTAL, CKMB, CKMBINDEX, TROPONINI in the last 168 hours. BNP (last 3 results) No results for input(s): PROBNP in the last 8760 hours. HbA1C: No results for input(s): HGBA1C in the last 72 hours. CBG:  Recent Labs Lab 12/31/15 0000  GLUCAP 151*   Lipid Profile: No results for input(s): CHOL, HDL, LDLCALC, TRIG, CHOLHDL, LDLDIRECT in the last 72 hours. Thyroid Function Tests: No results for input(s):  TSH, T4TOTAL, FREET4, T3FREE, THYROIDAB in the last 72 hours. Anemia Panel: No results for input(s): VITAMINB12, FOLATE, FERRITIN, TIBC, IRON, RETICCTPCT in the last 72 hours. Urine analysis:    Component Value Date/Time   COLORURINE YELLOW 12/30/2015 2052   APPEARANCEUR CLEAR 12/30/2015 2052   LABSPEC 1.019 12/30/2015 2052   PHURINE 6.5 12/30/2015 2052   GLUCOSEU 250 (A) 12/30/2015 2052   HGBUR SMALL (A) 12/30/2015 2052   BILIRUBINUR NEGATIVE 12/30/2015 2052   KETONESUR NEGATIVE 12/30/2015 2052   PROTEINUR >300 (A) 12/30/2015 2052   UROBILINOGEN 0.2 08/19/2013 2100   NITRITE NEGATIVE 12/30/2015 2052   LEUKOCYTESUR NEGATIVE 12/30/2015 2052   Sepsis Labs: @LABRCNTIP (procalcitonin:4,lacticidven:4) ) Recent Results (from the past 240 hour(s))  Culture, body fluid-bottle     Status: None (Preliminary result)   Collection Time: 12/30/15  8:09 PM  Result Value Ref Range Status   Specimen Description BLOOD PERITONEAL  Final   Special Requests BOTTLES DRAWN AEROBIC ONLY 5CC  Final   Culture PENDING  Incomplete   Report Status PENDING  Incomplete     Radiological Exams on Admission: Ct Abdomen Pelvis W Contrast  Result Date: 12/30/2015 CLINICAL DATA:  Right lower quadrant abdominal pain since last night. EXAM: CT ABDOMEN AND PELVIS WITH CONTRAST TECHNIQUE: Multidetector CT imaging of the abdomen and pelvis was performed using the standard protocol following bolus administration of intravenous contrast. CONTRAST:  ISOVUE-300 IOPAMIDOL (ISOVUE-300) INJECTION 61% COMPARISON:  None. FINDINGS: Lower chest:  No contributory findings. Hepatobiliary: No focal liver abnormality.No evidence of biliary obstruction or stone. Pancreas: Unremarkable. Spleen: Unremarkable. Adrenals/Urinary Tract: Negative adrenals. Minimally enhancing kidneys in this patient with end-stage renal disease. No hydronephrosis or stone. Unremarkable bladder. Stomach/Bowel:  No obstruction. No appendicitis.  Vascular/Lymphatic: No acute vascular abnormality. Diffuse arterial calcification. No mass or adenopathy. Reproductive:History of diabetes with vas deferens calcifications. Other: Small ascites and randomly distributed pneumoperitoneum in this patient with Tenckhoff catheter that is positioned in the ventral low pelvis. Musculoskeletal: No acute or aggressive finding. IMPRESSION: 1. No appendicitis or  other acute finding. 2. Small ascites and pneumoperitoneum as expected in this patient on peritoneal dialysis. Electronically Signed   By: Marnee Spring M.D.   On: 12/30/2015 19:48     Assessment/Plan Principal Problem:   Bacterial peritonitis (HCC) Active Problems:   DM type 2 causing vascular disease (HCC)   HTN (hypertension)   HLD (hyperlipidemia)   S/P BKA (below knee amputation) unilateral (HCC)   Peritonitis (HCC)    1. Bacterial peritonitis - nephrology has been consulted. Patient probably will need antibiotics through the dialysate fluid. For now I have placed patient on vancomycin and Zosyn. Follow cultures and pending peritoneal fluid labs. Patient is placed on liquid diet for now. 2. Diabetes mellitus type 2 - since patient has not eaten well I have placed patient on sliding scale coverage and holding off patient's long-acting insulin. 3. Hypertension - in addition to when necessary IV hydralazine and we will continue patient's home dose of antihypertensives including Coreg, Imdur, hydralazine. 4. Chronic anemia probably from ESRD - follow CBC. 5. Hyperlipidemia on statins. 6. History of left BKA. 7. ESRD on peritoneal dialysis per nephrology.   DVT prophylaxis: Heparin. Code Status: Full code.  Family Communication: Discussed with patient.  Disposition Plan: Home.  Consults called: Nephrology.  Admission status: Inpatient. Likely stay 2-3 days.    Eduard Clos MD [facility] Pager [phone].  If 7PM-7AM, please contact  night-coverage [email or web address] Password [email or web address]  12/31/2015, 12:51 AM

## 2015-12-31 NOTE — Progress Notes (Signed)
Pharmacy Antibiotic Note  Austin Mcdaniel is a 45 y.o. male admitted on 12/30/2015 with abd pain and fever - likely peritonitis. Cell count from peritoneal fluid also supports this.  Patient with ESRD on peritoneal HD- PTA he did not do exchanges on 11/4 due to pain, pt also noted cloudy fluid.   Patient received 1 time doses of IV ceftazidime and vancomycin in the ED and then pharmacy was formally consulted for Zosyn and Vancomycin- no specification made in consults or orders and both have been started IV.  Unfortunately, patient received a vancomycin load despite receiving a dose in the ED as well.  Plan: -Zosyn 2.25gm IV q8h -Will check vancomycin level 11/8 PM at the earliest (72h after initial vancomycin dose given) -Redose IV vancomycin when level <20 mcg/mL -Follow micro data, clinical condition, need to change antibiotics to intra-peritoneal   Height: 6\' 1"  (185.4 cm) Weight: 255 lb 4.7 oz (115.8 kg) IBW/kg (Calculated) : 79.9  Temp (24hrs), Avg:98.3 F (36.8 C), Min:98.2 F (36.8 C), Max:98.5 F (36.9 C)   Recent Labs Lab 12/30/15 1720 12/30/15 1728 12/30/15 2116 12/31/15 0659  WBC 11.3*  --   --  10.8*  CREATININE 9.47*  --   --  9.18*  LATICACIDVEN  --  0.86 0.86  --     Estimated Creatinine Clearance: 13.6 mL/min (by C-G formula based on SCr of 9.18 mg/dL (H)).    Allergies  Allergen Reactions  . Norvasc [Amlodipine Besylate] Swelling    Leg swelling    Antimicrobials this admission: Ceftazidime 11/5 x1 Vanc 11/5>>  *was given 1500mg  on 11/5 @ 2100 and 2500mg  IV x1 on 11/6 at 0245 Zosyn 11/6>>   Dose adjustments this admission:   Microbiology results: 11/5 BCx: sent 11/5 Peritoneal fluid: GPC (also on gm stain)  Thank you for allowing pharmacy to be a part of this patient's care.  Tajha Sammarco D. Randol Zumstein, PharmD, BCPS Clinical Pharmacist Pager: [phone] 12/31/2015 12:06 PM

## 2015-12-31 NOTE — Progress Notes (Signed)
Pharmacy Antibiotic Note  Austin HedgeSteven Mcdaniel is a 45 y.o. male admitted on 12/30/2015 with abd pain and fever - likely peritonitis.  Pharmacy has been consulted for Vancomycin and Zosyn dosing. Pt with ESRD - peritoneal HD which he skipped 11/4 due to pain, pt also noted cloudy fluid.   Plan: Zosyn 2.25gm IV q8h Vancomycin 2500mg  IV load Will f/u micro data, HD plans, and pt's clinical condition Vanc levels in 2-4 days and redose when level < 20   Height: 6\' 1"  (185.4 cm) Weight: 255 lb 4.7 oz (115.8 kg) IBW/kg (Calculated) : 79.9  Temp (24hrs), Avg:98.4 F (36.9 C), Min:98.3 F (36.8 C), Max:98.5 F (36.9 C)   Recent Labs Lab 12/30/15 1720 12/30/15 1728 12/30/15 2116  WBC 11.3*  --   --   CREATININE 9.47*  --   --   LATICACIDVEN  --  0.86 0.86    Estimated Creatinine Clearance: 13.1 mL/min (by C-G formula based on SCr of 9.47 mg/dL (H)).    Allergies  Allergen Reactions  . Norvasc [Amlodipine Besylate] Swelling    Leg swelling     Antimicrobials this admission: 11/6 Vanc >>  11/6 Zosyn >>   Dose adjustments this admission: n/a  Microbiology results: 11/5 BCx x2:  11/5 Peritoneal fluid:   Thank you for allowing pharmacy to be a part of this patient's care.  Christoper Fabianaron Rai Sinagra, PharmD, BCPS Clinical pharmacist, pager [phone] 12/31/2015 1:11 AM

## 2015-12-31 NOTE — Consult Note (Signed)
[facility] Renal Consultation Note  Indication for Consultation:  Management of ESRD/hemodialysis; anemia, hypertension/volume and secondary hyperparathyroidism  HPI: Austin Mcdaniel is a 45 y.o. male with ESRD secondary to  DM on CCPD ( Dr. Lupita Dawn) admitted with Peritonitis . Saturday reports abdominal pain and cloudy fluid ,"told by PD RN on call to come to ER" . WBC PD count = 41,200 with  Gram stain = GRAM POSITIVE COCCI IN PAIRS IN CLUSTERS , BC and Fluid CS pending . He denies any recent problems with PD exchangers on CCPD 7 times per week. HO Serratia peritonitis in Feb. 2017 . Currently feels better able to tolerate some lunch after antibiotics given. Noted he does have a L FA AVF last used  Per Pt In Jan  2017. He has a L BKA and using prosthesis.      Past Medical History:  Diagnosis Date  . Acid reflux   . Anemia   . Chronic kidney disease    CKD STAGE 4  . Diabetic foot ulcer ([place]) 06/2011; 10/2011-12/2011   left dorsal foot  (01/03/2012)  . Hypertension   . 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 Peritoneal dialysis status ([place])   . Renal insufficiency   . Spinal headache   . Type II diabetes mellitus ([place])     Past Surgical History:  Procedure Laterality Date  . AMPUTATION  01/06/2012   Procedure: AMPUTATION BELOW KNEE;  Surgeon: Wylene Simmer, MD;  Location: [place];  Service: Orthopedics;  Laterality: Left;  DR.HEWITT WOULD LIKE TO FOLLOW HIS OTHER AFTERNOON CASE STARTING AROUND 1700-1715  . AV FISTULA PLACEMENT Left 08/15/2014   Procedure: RADIOCEPHALIC ARTERIOVENOUS Left FISTULA CREATION;  Surgeon: Angelia Mould, MD;  Location: [place];  Service: Vascular;  Laterality: Left;  . INSERTION OF DIALYSIS CATHETER N/A 08/15/2014   Procedure: INSERTION OF DIALYSIS CATHETER;  Surgeon: Angelia Mould, MD;  Location: [place];  Service: Vascular;  Laterality: N/A;  . TOE AMPUTATION  2009; 2012?   left great; left 2nd toe      Family History   Problem Relation Age of Onset  . Hypertension Other       reports that he has been smoking Cigars.  He has a 0.70 pack-year smoking history. His smokeless tobacco use includes Snuff. He reports that he drinks about 1.2 oz of alcohol per week . He reports that he does not use drugs.   Allergies  Allergen Reactions  . Norvasc [Amlodipine Besylate] Swelling    Leg swelling     Prior to Admission medications   Medication Sig Start Date End Date Taking? Authorizing Provider  aspirin EC 81 MG tablet Take 81 mg by mouth daily.   Yes Historical Provider, MD  carvedilol (COREG) 12.5 MG tablet Take 12.5 mg by mouth 2 (two) times daily. 03/01/15  Yes Historical Provider, MD  ibuprofen (ADVIL,MOTRIN) 200 MG tablet Take 400 mg by mouth every 6 (six) hours as needed for headache (pain).   Yes Historical Provider, MD  Insulin Lispro Prot & Lispro (HUMALOG MIX 75/25 KWIKPEN) (75-25) 100 UNIT/ML Kwikpen Inject 12 Units into the skin 2 (two) times daily. Patient taking differently: Inject 10 Units into the skin 2 (two) times daily.  06/02/14  Yes Lucious Groves, DO  isosorbide mononitrate (IMDUR) 30 MG 24 hr tablet Take 30 mg by mouth daily. 03/01/15  Yes Historical Provider, MD  multivitamin (RENA-VIT) TABS tablet Take 1 tablet by mouth daily.   Yes Historical  Provider, MD  sevelamer carbonate (RENVELA) 800 MG tablet Take 1,600-4,000 mg by mouth See admin instructions. Take 5 tablets (4000 mg) by mouth with meals and 2 tablets (1600 mg) with snacks   Yes Historical Provider, MD  simvastatin (ZOCOR) 40 MG tablet Take 40 mg by mouth at bedtime.   Yes Historical Provider, MD  temazepam (RESTORIL) 15 MG capsule Take 15 mg by mouth at bedtime as needed for sleep.  12/04/15  Yes Historical Provider, MD  Blood Glucose Monitoring Suppl (RELION PRIME MONITOR) DEVI 1 Units by Does not apply route 3 (three) times daily before meals. 06/02/14   Lucious Groves, DO  glucose blood (RELION PRIME TEST) test strip Use as instructed  06/02/14   Lucious Groves, DO  hydrALAZINE (APRESOLINE) 25 MG tablet Take 25 mg by mouth 2 (two) times daily. 03/01/15   Historical Provider, MD  HYDROcodone-acetaminophen (NORCO) 5-325 MG per tablet Take 1 tablet by mouth every 6 (six) hours as needed for moderate pain. Patient not taking: Reported on 12/30/2015 08/15/14   Ulyses Amor, PA-C  Insulin Pen Needle 31G X 5 MM MISC 1 Units by Does not apply route as needed. 06/02/14   Lucious Groves, DO  labetalol (NORMODYNE) 100 MG tablet Take 1 tablet (100 mg total) by mouth 2 (two) times daily. Patient not taking: Reported on 12/30/2015 06/02/14   Lucious Groves, DO  oxyCODONE-acetaminophen (ROXICET) 5-325 MG per tablet Take 1 tablet by mouth every 6 (six) hours as needed for severe pain. Patient not taking: Reported on 12/30/2015 08/15/14   Alvia Grove, PA-C  pravastatin (PRAVACHOL) 40 MG tablet Take 1 tablet (40 mg total) by mouth daily. Patient not taking: Reported on 12/30/2015 06/02/14   Lucious Groves, DO  RELION LANCETS THIN 26G MISC 1 Units by Does not apply route 3 (three) times daily before meals. 06/02/14   Lucious Groves, DO      Results for orders placed or performed during the hospital encounter of 12/30/15 (from the past 48 hour(s))  Lipase, blood     Status: None   Collection Time: 12/30/15  5:20 PM  Result Value Ref Range   Lipase 40 11 - 51 U/L  Comprehensive metabolic panel     Status: Abnormal   Collection Time: 12/30/15  5:20 PM  Result Value Ref Range   Sodium 136 135 - 145 mmol/L   Potassium 3.6 3.5 - 5.1 mmol/L   Chloride 97 (L) 101 - 111 mmol/L   CO2 26 22 - 32 mmol/L   Glucose, Bld 159 (H) 65 - 99 mg/dL   BUN 70 (H) 6 - 20 mg/dL   Creatinine, Ser 9.47 (H) 0.61 - 1.24 mg/dL   Calcium 9.1 8.9 - 10.3 mg/dL   Total Protein 6.1 (L) 6.5 - 8.1 g/dL   Albumin 2.5 (L) 3.5 - 5.0 g/dL   AST 14 (L) 15 - 41 U/L   ALT 13 (L) 17 - 63 U/L   Alkaline Phosphatase 55 38 - 126 U/L   Total Bilirubin 0.6 0.3 - 1.2 mg/dL   GFR calc non Af  Amer 6 (L) >60 mL/min   GFR calc Af Amer 7 (L) >60 mL/min    Comment: (NOTE) The eGFR has been calculated using the CKD EPI equation. This calculation has not been validated in all clinical situations. eGFR's persistently <60 mL/min signify possible Chronic Kidney Disease.    Anion gap 13 5 - 15  CBC with Differential  Status: Abnormal   Collection Time: 12/30/15  5:20 PM  Result Value Ref Range   WBC 11.3 (H) 4.0 - 10.5 K/uL   RBC 3.92 (L) 4.22 - 5.81 MIL/uL   Hemoglobin 12.2 (L) 13.0 - 17.0 g/dL   HCT 35.8 (L) 39.0 - 52.0 %   MCV 91.3 78.0 - 100.0 fL   MCH 31.1 26.0 - 34.0 pg   MCHC 34.1 30.0 - 36.0 g/dL   RDW 13.3 11.5 - 15.5 %   Platelets 184 150 - 400 K/uL   Neutrophils Relative % 78 %   Neutro Abs 8.8 (H) 1.7 - 7.7 K/uL   Lymphocytes Relative 12 %   Lymphs Abs 1.4 0.7 - 4.0 K/uL   Monocytes Relative 9 %   Monocytes Absolute 1.0 0.1 - 1.0 K/uL   Eosinophils Relative 1 %   Eosinophils Absolute 0.1 0.0 - 0.7 K/uL   Basophils Relative 0 %   Basophils Absolute 0.0 0.0 - 0.1 K/uL  I-Stat CG4 Lactic Acid, ED     Status: None   Collection Time: 12/30/15  5:28 PM  Result Value Ref Range   Lactic Acid, Venous 0.86 0.5 - 1.9 mmol/L  Body fluid cell count with differential     Status: Abnormal   Collection Time: 12/30/15  8:09 PM  Result Value Ref Range   Fluid Type-FCT Peritoneal    Color, Fluid STRAW (A) YELLOW   Appearance, Fluid CLOUDY (A) CLEAR   WBC, Fluid 41,200 (H) 0 - 1,000 cu mm   Neutrophil Count, Fluid 91 (H) 0 - 25 %   Lymphs, Fluid 5 %   Monocyte-Macrophage-Serous Fluid 4 (L) 50 - 90 %   Other Cells, Fluid  %    INTRACELLULAR MICROORGANISMS SEEN, PLEASE CORRELATE WITH MICROBIOLOGY  Lactate dehydrogenase, Peritoneal fluid     Status: Abnormal   Collection Time: 12/30/15  8:09 PM  Result Value Ref Range   LD, Fluid 594 (H) 3 - 23 U/L    Comment: (NOTE) Results should be evaluated in conjunction with serum values    Fluid Type-FLDH PERITONEAL CAVITY    Glucose, Peritoneal fluid     Status: None   Collection Time: 12/30/15  8:09 PM  Result Value Ref Range   Glucose, Peritoneal Fluid 113 mg/dL    Comment: NO NORMAL RANGE ESTABLISHED FOR THIS TEST  Protein, Peritoneal fluid     Status: None   Collection Time: 12/30/15  8:09 PM  Result Value Ref Range   Total protein, fluid 3.7 g/dL    Comment: (NOTE) No normal range established for this test Results should be evaluated in conjunction with serum values    Fluid Type-FTP PERITONEAL CAVITY   Albumin, Peritoneal fluid     Status: None   Collection Time: 12/30/15  8:09 PM  Result Value Ref Range   Albumin, Fluid 1.9 g/dL    Comment: (NOTE) No normal range established for this test Results should be evaluated in conjunction with serum values    Fluid Type-FALB PERITONEAL CAVITY   Stat Gram stain     Status: None   Collection Time: 12/30/15  8:09 PM  Result Value Ref Range   Specimen Description PERITONEAL FLUID    Special Requests NONE    Gram Stain      ABUNDANT WBC PRESENT,BOTH PMN AND MONONUCLEAR FEW GRAM POSITIVE COCCI IN CLUSTERS IN PAIRS CRITICAL RESULT CALLED TO, READ BACK BY AND VERIFIED WITH: K.GENGLER,RN 1638 12/31/15 G.MCADOO    Report Status 12/31/2015 FINAL  Culture, body fluid-bottle     Status: None (Preliminary result)   Collection Time: 12/30/15  8:09 PM  Result Value Ref Range   Specimen Description BLOOD PERITONEAL    Special Requests BOTTLES DRAWN AEROBIC ONLY 5CC    Gram Stain      GRAM POSITIVE COCCI IN PAIRS IN CLUSTERS AEROBIC BOTTLE ONLY CONSISTENT WITH ORIGINAL GRAM STAIN RESULTS    Culture PENDING    Report Status PENDING   Pathologist smear review     Status: None   Collection Time: 12/30/15  8:09 PM  Result Value Ref Range   Path Review Abundant inflammatory cells,     Comment: predominantly neutrophils. Reviewed by Lennox Solders Lyndon Code, M.D. 12/31/15.   Urinalysis, Routine w reflex microscopic     Status: Abnormal   Collection Time: 12/30/15   8:52 PM  Result Value Ref Range   Color, Urine YELLOW YELLOW   APPearance CLEAR CLEAR   Specific Gravity, Urine 1.019 1.005 - 1.030   pH 6.5 5.0 - 8.0   Glucose, UA 250 (A) NEGATIVE mg/dL   Hgb urine dipstick SMALL (A) NEGATIVE   Bilirubin Urine NEGATIVE NEGATIVE   Ketones, ur NEGATIVE NEGATIVE mg/dL   Protein, ur >300 (A) NEGATIVE mg/dL   Nitrite NEGATIVE NEGATIVE   Leukocytes, UA NEGATIVE NEGATIVE  Urine microscopic-add on     Status: Abnormal   Collection Time: 12/30/15  8:52 PM  Result Value Ref Range   Squamous Epithelial / LPF 0-5 (A) NONE SEEN   WBC, UA 0-5 0 - 5 WBC/hpf   RBC / HPF 0-5 0 - 5 RBC/hpf   Bacteria, UA RARE (A) NONE SEEN   Urine-Other MUCOUS PRESENT   I-Stat CG4 Lactic Acid, ED     Status: None   Collection Time: 12/30/15  9:16 PM  Result Value Ref Range   Lactic Acid, Venous 0.86 0.5 - 1.9 mmol/L  Glucose, capillary     Status: Abnormal   Collection Time: 12/31/15 12:00 AM  Result Value Ref Range   Glucose-Capillary 151 (H) 65 - 99 mg/dL  Basic metabolic panel     Status: Abnormal   Collection Time: 12/31/15  6:59 AM  Result Value Ref Range   Sodium 133 (L) 135 - 145 mmol/L   Potassium 3.7 3.5 - 5.1 mmol/L   Chloride 96 (L) 101 - 111 mmol/L   CO2 24 22 - 32 mmol/L   Glucose, Bld 205 (H) 65 - 99 mg/dL   BUN 65 (H) 6 - 20 mg/dL   Creatinine, Ser 9.18 (H) 0.61 - 1.24 mg/dL   Calcium 8.7 (L) 8.9 - 10.3 mg/dL   GFR calc non Af Amer 6 (L) >60 mL/min   GFR calc Af Amer 7 (L) >60 mL/min    Comment: (NOTE) The eGFR has been calculated using the CKD EPI equation. This calculation has not been validated in all clinical situations. eGFR's persistently <60 mL/min signify possible Chronic Kidney Disease.    Anion gap 13 5 - 15  CBC     Status: Abnormal   Collection Time: 12/31/15  6:59 AM  Result Value Ref Range   WBC 10.8 (H) 4.0 - 10.5 K/uL   RBC 3.27 (L) 4.22 - 5.81 MIL/uL   Hemoglobin 10.1 (L) 13.0 - 17.0 g/dL   HCT 30.5 (L) 39.0 - 52.0 %   MCV 93.3  78.0 - 100.0 fL   MCH 30.9 26.0 - 34.0 pg   MCHC 33.1 30.0 - 36.0 g/dL   RDW 13.5  11.5 - 15.5 %   Platelets 195 150 - 400 K/uL  Glucose, capillary     Status: Abnormal   Collection Time: 12/31/15  7:54 AM  Result Value Ref Range   Glucose-Capillary 255 (H) 65 - 99 mg/dL  Glucose, capillary     Status: Abnormal   Collection Time: 12/31/15 12:04 PM  Result Value Ref Range   Glucose-Capillary 207 (H) 65 - 99 mg/dL    ROS:  He reports some subjective fevers, has had some Intermittent constipation and diarrhea  But taking combination Auryxia and Renvela helping.  Otherwise  Denies sob, chest pain, dizziness . Has old callous on L BKA     Physical Exam: Vitals:   12/31/15 0547 12/31/15 0800  BP: 126/74 (!) 151/95  Pulse: 79 72  Resp: 18 18  Temp: 98.2 F (36.8 C)      General: alert  Soft spoken WM , NAD  HEENT: [place] , MMM, Nonicteric  Neck: no jvd, supple Heart: RRR, No rub or mur.  Lungs: CTA nonlabored breathing Abdomen: Obese, soft NT, PD cath in L lower quad  No active dc  Extremities: no pedal edema/ L BKA Skin:  Warm dry / no overt rash Neuro: OX3 , moves all extrem . No acute focal deficits noted  Dialysis Access: PD Cath/ LFA AVF pos bruit   Dialysis Orders: On CCPD   7 times /week  115kg edw  6 exchanges, Fill vol 2500  Dwell time 1hr 17mn  Last fill vol 2000 / no day exchanges   Assessment/Plan  1. Peritonitis= IV Ceftazidime/  And IV Vancomycin given in ER pharmacy consulted / Zosyn  Added, FU Cultures / WBC  Daily  2. ESRD - k= 3.7  CCPD continue use 1.5 %  3. Hypertension/volume  - vol okay at edw  / on Coreg / hydralazine and Labetalol  4. Anemia  - hgb  12.2 >10.1  Fu hgb trend no recent Mircera  5. Metabolic bone disease -  On Renvela and Auryxia  As op  "mainly for  diarrhea and constipation  Effect "/ use Velphoro and renvela  In  Hosp/  6. CAD-meds  7. DM type 2 - per admit   DErnest Haber PA-C [id number]/07/2015,  1:52 PM    I have seen and examined this patient and agree with plan and assessment in the above note with renal recommendations/intervention highlighted. Feels comfortable.  PD exit site with some erythema and traumatized exit site but no warmth, tenderness, or drainage.  Await culture and sensitivity results.  Hopefully can narrow antibiotics soon. JBroadus JohnA Otis Portal,MD 12/31/2015 2:55 PM

## 2016-01-01 LAB — PHOSPHORUS: PHOSPHORUS: 6.5 mg/dL — AB (ref 2.5–4.6)

## 2016-01-01 LAB — BODY FLUID CELL COUNT WITH DIFFERENTIAL
Eos, Fluid: 0 %
Lymphs, Fluid: 12 %
MONOCYTE-MACROPHAGE-SEROUS FLUID: 10 % — AB (ref 50–90)
Neutrophil Count, Fluid: 78 % — ABNORMAL HIGH (ref 0–25)
Total Nucleated Cell Count, Fluid: 312 cu mm (ref 0–1000)

## 2016-01-01 LAB — CBC
HCT: 26 % — ABNORMAL LOW (ref 39.0–52.0)
HEMOGLOBIN: 8.6 g/dL — AB (ref 13.0–17.0)
MCH: 30.9 pg (ref 26.0–34.0)
MCHC: 33.1 g/dL (ref 30.0–36.0)
MCV: 93.5 fL (ref 78.0–100.0)
Platelets: 175 10*3/uL (ref 150–400)
RBC: 2.78 MIL/uL — AB (ref 4.22–5.81)
RDW: 13.3 % (ref 11.5–15.5)
WBC: 7.8 10*3/uL (ref 4.0–10.5)

## 2016-01-01 LAB — BASIC METABOLIC PANEL
ANION GAP: 14 (ref 5–15)
BUN: 58 mg/dL — ABNORMAL HIGH (ref 6–20)
CHLORIDE: 93 mmol/L — AB (ref 101–111)
CO2: 26 mmol/L (ref 22–32)
CREATININE: 9.1 mg/dL — AB (ref 0.61–1.24)
Calcium: 8.4 mg/dL — ABNORMAL LOW (ref 8.9–10.3)
GFR calc non Af Amer: 6 mL/min — ABNORMAL LOW (ref >60)
GFR, EST AFRICAN AMERICAN: 7 mL/min — AB (ref >60)
Glucose, Bld: 254 mg/dL — ABNORMAL HIGH (ref 65–99)
Potassium: 3.4 mmol/L — ABNORMAL LOW (ref 3.5–5.1)
SODIUM: 133 mmol/L — AB (ref 135–145)

## 2016-01-01 LAB — GRAM STAIN

## 2016-01-01 LAB — GLUCOSE, CAPILLARY
GLUCOSE-CAPILLARY: 264 mg/dL — AB (ref 65–99)
Glucose-Capillary: 207 mg/dL — ABNORMAL HIGH (ref 65–99)

## 2016-01-01 MED ORDER — UNABLE TO FIND: 0 refills | Status: DC

## 2016-01-01 MED ORDER — DARBEPOETIN ALFA 100 MCG/0.5ML IJ SOSY
100.0000 ug | PREFILLED_SYRINGE | INTRAMUSCULAR | Status: DC
  Administered 2016-01-01: 100 ug via INTRAVENOUS
  Filled 2016-01-01: qty 0.5

## 2016-01-01 NOTE — Progress Notes (Signed)
PD Sample obtained and sent to lab

## 2016-01-01 NOTE — Progress Notes (Signed)
Subjective:  Feels better, no abd pain   Objective Vital signs in last 24 hours: Vitals:   12/31/15 0800 12/31/15 1712 12/31/15 2104 01/01/16 0553  BP: (!) 151/95 (!) 118/57 (!) 100/56 (!) 150/74  Pulse: 72 62 62 60  Resp: 18 18 16 18   Temp:  97.7 F (36.5 C) 98.2 F (36.8 C) 98.2 F (36.8 C)  TempSrc:  Oral Oral Oral  SpO2: 96% 93% 92% 93%  Weight:      Height:        Physical Exam: General: alert, pleasant  NAD  Heart: RRR, No rub or mur.  Lungs: CTA nonlabored breathing Abdomen: Obese, soft NT, PD cath in L lower quad  No active dc  Extremities: no pedal edema/ L BKA Dialysis Access: PD Cath/ LFA AVF pos bruit   Dialysis Orders: On CCPD   7 times /week  115kg edw  6 exchanges, Fill vol 2500  Dwell time 1hr 15min  Last fill vol 2000 / no day exchanges   Problem/Plan: 1. Peritonitis= IV Ceftazidime/  And IV Vancomycin given in ER pharmacy consulted / Zosyn  Added, FU Cultures / WBC  Daily / this am Told by Gregor Hamsn Rebecca  "PD wbc  Yesterday per lab not able to Run Sec Labeling issue "/ Today WBC ct pend and PD ,fluid clearing  2. ESRD - k= 3.4  CCPD continue use 1.5 %  3. Hypertension/volume  - vol okay at edw  / on Coreg / hydralazine and Labetalol  4. Anemia  - hgb  12.2 >10.1  >8.6  Fu hgb trend no recent Mircera/ Give Aranesp 100 today.  5. Metabolic bone disease -  On Renvela and Auryxia  As op  "mainly for  diarrhea and constipation  Effect "/ use Velphoro and renvela  In  Hosp/  6. CAD-meds  7. DM type 2 - per admit  Lenny Pastelavid Zeyfang, PA-C [facility] Beeper [phone] 01/01/2016,8:10 AM  LOS: 2 days   Labs: Basic Metabolic Panel:  Recent Labs Lab 12/30/15 1720 12/31/15 0659 01/01/16 0515  NA 136 133* 133*  K 3.6 3.7 3.4*  CL 97* 96* 93*  CO2 26 24 26   GLUCOSE 159* 205* 254*  BUN 70* 65* 58*  CREATININE 9.47* 9.18* 9.10*  CALCIUM 9.1 8.7* 8.4*  PHOS  --   --  6.5*   Liver Function Tests:  Recent Labs Lab 12/30/15 1720  AST 14*  ALT  13*  ALKPHOS 55  BILITOT 0.6  PROT 6.1*  ALBUMIN 2.5*    Recent Labs Lab 12/30/15 1720  LIPASE 40   No results for input(s): AMMONIA in the last 168 hours. CBC:  Recent Labs Lab 12/30/15 1720 12/31/15 0659 01/01/16 0515  WBC 11.3* 10.8* 7.8  NEUTROABS 8.8*  --   --   HGB 12.2* 10.1* 8.6*  HCT 35.8* 30.5* 26.0*  MCV 91.3 93.3 93.5  PLT 184 195 175   Cardiac Enzymes: No results for input(s): CKTOTAL, CKMB, CKMBINDEX, TROPONINI in the last 168 hours. CBG:  Recent Labs Lab 12/31/15 0754 12/31/15 1204 12/31/15 1640 12/31/15 2101 01/01/16 0752  GLUCAP 255* 207* 154* 178* 264*    Studies/Results: Ct Abdomen Pelvis W Contrast  Result Date: 12/30/2015 CLINICAL DATA:  Right lower quadrant abdominal pain since last night. EXAM: CT ABDOMEN AND PELVIS WITH CONTRAST TECHNIQUE: Multidetector CT imaging of the abdomen and pelvis was performed using the standard protocol following bolus administration of intravenous contrast. CONTRAST:  100mL ISOVUE-300 IOPAMIDOL (ISOVUE-300) INJECTION 61%  COMPARISON:  None. FINDINGS: Lower chest:  No contributory findings. Hepatobiliary: No focal liver abnormality.No evidence of biliary obstruction or stone. Pancreas: Unremarkable. Spleen: Unremarkable. Adrenals/Urinary Tract: Negative adrenals. Minimally enhancing kidneys in this patient with end-stage renal disease. No hydronephrosis or stone. Unremarkable bladder. Stomach/Bowel:  No obstruction. No appendicitis. Vascular/Lymphatic: No acute vascular abnormality. Diffuse arterial calcification. No mass or adenopathy. Reproductive:History of diabetes with vas deferens calcifications. Other: Small ascites and randomly distributed pneumoperitoneum in this patient with Tenckhoff catheter that is positioned in the ventral low pelvis. Musculoskeletal: No acute or aggressive finding. IMPRESSION: 1. No appendicitis or other acute finding. 2. Small ascites and pneumoperitoneum as expected in this patient on  peritoneal dialysis. Electronically Signed   By: Marnee SpringJonathon  Watts M.D.   On: 12/30/2015 19:48   Medications:  . aspirin EC  81 mg Oral Daily  . carvedilol  12.5 mg Oral BID WC  . dialysis solution 1.5% low-MG/low-CA   Intraperitoneal Q24H  . dialysis solution 1.5% low-MG/low-CA   Intraperitoneal Q24H  . dialysis solution 2.5% low-MG/low-CA   Intraperitoneal Q24H  . gentamicin cream  1 application Topical Daily  . heparin  5,000 Units Subcutaneous Q8H  . hydrALAZINE  25 mg Oral BID  . insulin aspart  0-9 Units Subcutaneous TID WC  . isosorbide mononitrate  30 mg Oral Daily  . multivitamin  1 tablet Oral QHS  . piperacillin-tazobactam (ZOSYN)  IV  2.25 g Intravenous Q8H  . sevelamer carbonate  4,000 mg Oral TID WC  . simvastatin  40 mg Oral QHS  . sucroferric oxyhydroxide  500 mg Oral TID WC    I have seen and examined this patient and agree with plan and assessment in the above note with renal recommendations/intervention highlighted. Cultures + for staph aureus. Awaiting sensitivities.  He is without abdominal pain, afebrile, and eating well.  He is stable for discharge on Vanco and ancef with f/u at hometraining tomorrow.  Will follow culture sensitivities as an outpatient. Jomarie LongsJoseph A Brighten Orndoff,MD 01/01/2016 11:58 AM

## 2016-01-01 NOTE — Care Management Note (Signed)
Case Management Note  Patient Details  Name: Harold HedgeSteven Siefring MRN: [id number] Date of Birth: May 14, 1970  Subjective/Objective:   CM following for progression and d/c planning.                 Action/Plan: 01/01/2016 Pt for d/c to home , pt experienced with PD and is informed re going to HD center to receive antibiotic to add to PD fluid at home. This CM checked with Onalee Huaavid, [place] with urology who will validate with with MD and center that they have info re this antibiotic need. Pt RN , Caryl AspKellie aware of process.   Expected Discharge Date:  01/02/16               Expected Discharge Plan:  Home/Self Care  In-House Referral:  NA  Discharge planning Services  NA  Post Acute Care Choice:  NA Choice offered to:  NA  DME Arranged:  N/A DME Agency:  NA  HH Arranged:  NA HH Agency:  NA  Status of Service:  Completed, signed off  If discussed at Long Length of Stay Meetings, dates discussed:    Additional Comments:  Starlyn SkeansRoyal, Franny Selvage U, RN 01/01/2016, 2:41 PM

## 2016-01-01 NOTE — Discharge Summary (Signed)
[facility]  Physician Discharge Summary   Patient ID: Austin HedgeSteven Halterman MRN: [id number] DOB/AGE: 03-23-1970 45 y.o.  Admit date: 12/30/2015 Discharge date: 01/01/2016  PCP: No PCP Per Patient  DISCHARGE DIAGNOSES:  Principal Problem:   Bacterial peritonitis (HCC) Active Problems:   DM type 2 causing vascular disease (HCC)   HTN (hypertension)   HLD (hyperlipidemia)   S/P BKA (below knee amputation) unilateral (HCC)   Peritonitis (HCC)   RECOMMENDATIONS FOR OUTPATIENT FOLLOW UP: 1. Patient to receive intraperitoneal vancomycin and Ancef at home per nephrology   DISCHARGE CONDITION: fair  Diet recommendation: Modified carbohydrate  Filed Weights   12/30/15 1627 12/30/15 2354  Weight: 111.1 kg (245 lb) 115.8 kg (255 lb 4.7 oz)    INITIAL HISTORY: 45 year old Caucasian male with a past medical history of end-stage renal disease on dialysis since July 2016, initially on hemodialysis and now on peritoneal dialysis since January 2017, history of coronary artery disease status [facility] this year, hypertension, hyperlipidemia and diabetes, presented with complaints of abdominal pain, nausea and vomiting. Patient was diagnosed as having peritonitis and was hospitalized for further management.  Consultants: Nephrology  Procedures: Peritoneal dialysis. Paracentesis   HOSPITAL COURSE:   Bacterial peritonitis Patient's peritoneal Fluid analysis showed significantly elevated white cells. Gram stain was positive for gram-positive cocci. Patient has had episode of the peritonitis previously as well. He grew Serratia back in January. Patient was started on vancomycin and Zosyn. Nephrology was consulted. Patient continued with his peritoneal dialysis in the hospital. Patient's symptoms of abdominal pain, nausea and vomiting have resolved. His WBC is now normal. Discussed with nephrology today. They will arrange for intraperitoneal vancomycin and Ancef to be given  at home tomorrow. He has a home training session tomorrow. They will follow on the culture data. They feel that he can go home. Patient agreeable. Okay for discharge today.    History of coronary artery disease status post bypass in [facility]. Stable currently. Patient denies any chest pain. Continue with beta blocker. Continue with aspirin and statin. Continue with nitrates and hydralazine. [facility].  Diabetes mellitus type 2 Patient may resume his home medication regimen.  Essential Hypertension Monitor blood pressures closely. Continue home medications. Hydralazine as needed.  Chronic anemia Probably from ESRD. Hemoglobin did drop. His elevated hemoglobin at the time of admission was most likely due to hemoconcentration. His hemoglobin is now at baseline. There was no evidence for any overt bleeding.  Hyperlipidemia On statins.  History of left BKA. Stable  ESRDon peritoneal dialysis Management per nephrology.  Cleared for discharge by nephrology.  PERTINENT LABS:  The results of significant diagnostics from this hospitalization (including imaging, microbiology, ancillary and laboratory) are listed below for reference.    Microbiology: Recent Results (from the past 240 hour(s))  Blood culture (routine x 2)     Status: None (Preliminary result)   Collection Time: 12/30/15  6:05 PM  Result Value Ref Range Status   Specimen Description BLOOD RIGHT WRIST  Final   Special Requests IN PEDIATRIC BOTTLE 5CC  Final   Culture NO GROWTH < 24 HOURS  Final   Report Status PENDING  Incomplete  Stat Gram stain     Status: None   Collection Time: 12/30/15  8:09 PM  Result Value Ref Range Status   Specimen Description PERITONEAL FLUID  Final   Special Requests NONE  Final   Gram Stain   Final    ABUNDANT WBC  PRESENT,BOTH PMN AND MONONUCLEAR FEW GRAM POSITIVE COCCI IN CLUSTERS IN PAIRS CRITICAL RESULT CALLED TO, READ BACK BY  AND VERIFIED WITH: K.GENGLER,RN [id number] 12/31/15 G.MCADOO    Report Status 12/31/2015 FINAL  Final  Culture, body fluid-bottle     Status: Abnormal (Preliminary result)   Collection Time: 12/30/15  8:09 PM  Result Value Ref Range Status   Specimen Description BLOOD PERITONEAL  Final   Special Requests BOTTLES DRAWN AEROBIC ONLY 5CC  Final   Gram Stain   Final    GRAM POSITIVE COCCI IN PAIRS IN CLUSTERS AEROBIC BOTTLE ONLY CONSISTENT WITH ORIGINAL GRAM STAIN RESULTS    Culture (A)  Final    STAPHYLOCOCCUS AUREUS SUSCEPTIBILITIES TO FOLLOW    Report Status PENDING  Incomplete     Labs: Basic Metabolic Panel:  Recent Labs Lab 12/30/15 1720 12/31/15 0659 01/01/16 0515  NA 136 133* 133*  K 3.6 3.7 3.4*  CL 97* 96* 93*  CO2 26 24 26   GLUCOSE 159* 205* 254*  BUN 70* 65* 58*  CREATININE 9.47* 9.18* 9.10*  CALCIUM 9.1 8.7* 8.4*  PHOS  --   --  6.5*   Liver Function Tests:  Recent Labs Lab 12/30/15 1720  AST 14*  ALT 13*  ALKPHOS 55  BILITOT 0.6  PROT 6.1*  ALBUMIN 2.5*    Recent Labs Lab 12/30/15 1720  LIPASE 40   CBC:  Recent Labs Lab 12/30/15 1720 12/31/15 0659 01/01/16 0515  WBC 11.3* 10.8* 7.8  NEUTROABS 8.8*  --   --   HGB 12.2* 10.1* 8.6*  HCT 35.8* 30.5* 26.0*  MCV 91.3 93.3 93.5  PLT 184 195 175   CBG:  Recent Labs Lab 12/31/15 1204 12/31/15 1640 12/31/15 2101 01/01/16 0752 01/01/16 1159  GLUCAP 207* 154* 178* 264* 207*     IMAGING STUDIES Ct Abdomen Pelvis W Contrast  Result Date: 12/30/2015 CLINICAL DATA:  Right lower quadrant abdominal pain since last night. EXAM: CT ABDOMEN AND PELVIS WITH CONTRAST TECHNIQUE: Multidetector CT imaging of the abdomen and pelvis was performed using the standard protocol following bolus administration of intravenous contrast. CONTRAST:  100mL ISOVUE-300 IOPAMIDOL (ISOVUE-300) INJECTION 61% COMPARISON:  None. FINDINGS: Lower chest:  No contributory findings. Hepatobiliary: No focal liver abnormality.No  evidence of biliary obstruction or stone. Pancreas: Unremarkable. Spleen: Unremarkable. Adrenals/Urinary Tract: Negative adrenals. Minimally enhancing kidneys in this patient with end-stage renal disease. No hydronephrosis or stone. Unremarkable bladder. Stomach/Bowel:  No obstruction. No appendicitis. Vascular/Lymphatic: No acute vascular abnormality. Diffuse arterial calcification. No mass or adenopathy. Reproductive:History of diabetes with vas deferens calcifications. Other: Small ascites and randomly distributed pneumoperitoneum in this patient with Tenckhoff catheter that is positioned in the ventral low pelvis. Musculoskeletal: No acute or aggressive finding. IMPRESSION: 1. No appendicitis or other acute finding. 2. Small ascites and pneumoperitoneum as expected in this patient on peritoneal dialysis. Electronically Signed   By: Marnee SpringJonathon  Watts M.D.   On: 12/30/2015 19:48    DISCHARGE EXAMINATION: Vitals:   12/31/15 1712 12/31/15 2104 01/01/16 0553 01/01/16 0837  BP: (!) 118/57 (!) 100/56 (!) 150/74 115/65  Pulse: 62 62 60 (!) 58  Resp: 18 16 18    Temp: 97.7 F (36.5 C) 98.2 F (36.8 C) 98.2 F (36.8 C) 98.3 F (36.8 C)  TempSrc: Oral Oral Oral Oral  SpO2: 93% 92% 93%   Weight:      Height:       General appearance: alert, cooperative, appears stated age and no distress Resp: clear to auscultation bilaterally  Cardio: regular rate and rhythm, S1, S2 normal, no murmur, click, rub or gallop GI: soft, non-tender; bowel sounds normal; no masses,  no organomegaly Extremities: Minimal edema in the lower extremity  DISPOSITION: Home  Discharge Instructions    Call MD for:  extreme fatigue    Complete by:  As directed    Call MD for:  persistant dizziness or light-headedness    Complete by:  As directed    Call MD for:  persistant nausea and vomiting    Complete by:  As directed    Call MD for:  severe uncontrolled pain    Complete by:  As directed    Call MD for:  temperature  >100.4    Complete by:  As directed    Discharge instructions    Complete by:  As directed    Your home training nurse will give you antibiotics at home starting tomorrow. Please take your medications as prescribed.  You were cared for by a hospitalist during your hospital stay. If you have any questions about your discharge medications or the care you received while you were in the hospital after you are discharged, you can call the unit and asked to speak with the hospitalist on call if the hospitalist that took care of you is not available. Once you are discharged, your primary care physician will handle any further medical issues. Please note that NO REFILLS for any discharge medications will be authorized once you are discharged, as it is imperative that you return to your primary care physician (or establish a relationship with a primary care physician if you do not have one) for your aftercare needs so that they can reassess your need for medications and monitor your lab values. If you do not have a primary care physician, you can call [phone] for a physician referral.   Increase activity slowly    Complete by:  As directed       ALLERGIES:  Allergies  Allergen Reactions  . Norvasc [Amlodipine Besylate] Swelling    Leg swelling      Current Discharge Medication List    START taking these medications   Details  UNABLE TO FIND Vancomycin and Ancef to be given intraperitoneally starting 01/02/16 at home per Dr. Arrie Aran. Qty: 1 each, Refills: 0      CONTINUE these medications which have NOT CHANGED   Details  aspirin EC 81 MG tablet Take 81 mg by mouth daily.    carvedilol (COREG) 12.5 MG tablet Take 12.5 mg by mouth 2 (two) times daily.    ibuprofen (ADVIL,MOTRIN) 200 MG tablet Take 400 mg by mouth every 6 (six) hours as needed for headache (pain).    Insulin Lispro Prot & Lispro (HUMALOG MIX 75/25 KWIKPEN) (75-25) 100 UNIT/ML Kwikpen Inject 12 Units into the skin 2 (two)  times daily. Qty: 15 mL, Refills: 5    isosorbide mononitrate (IMDUR) 30 MG 24 hr tablet Take 30 mg by mouth daily.    multivitamin (RENA-VIT) TABS tablet Take 1 tablet by mouth daily.    sevelamer carbonate (RENVELA) 800 MG tablet Take 1,600-4,000 mg by mouth See admin instructions. Take 5 tablets (4000 mg) by mouth with meals and 2 tablets (1600 mg) with snacks    simvastatin (ZOCOR) 40 MG tablet Take 40 mg by mouth at bedtime.    temazepam (RESTORIL) 15 MG capsule Take 15 mg by mouth at bedtime as needed for sleep.  Refills: 2    Blood Glucose Monitoring Suppl (RELION  PRIME MONITOR) DEVI 1 Units by Does not apply route 3 (three) times daily before meals. Qty: 100 Device, Refills: 11    glucose blood (RELION PRIME TEST) test strip Use as instructed Qty: 100 each, Refills: 12    hydrALAZINE (APRESOLINE) 25 MG tablet Take 25 mg by mouth 2 (two) times daily.    HYDROcodone-acetaminophen (NORCO) 5-325 MG per tablet Take 1 tablet by mouth every 6 (six) hours as needed for moderate pain. Qty: 30 tablet, Refills: 0    Insulin Pen Needle 31G X 5 MM MISC 1 Units by Does not apply route as needed. Qty: 100 each, Refills: 11    labetalol (NORMODYNE) 100 MG tablet Take 1 tablet (100 mg total) by mouth 2 (two) times daily. Qty: 60 tablet, Refills: 5   Associated Diagnoses: Renovascular hypertension    oxyCODONE-acetaminophen (ROXICET) 5-325 MG per tablet Take 1 tablet by mouth every 6 (six) hours as needed for severe pain. Qty: 20 tablet, Refills: 0    RELION LANCETS THIN 26G MISC 1 Units by Does not apply route 3 (three) times daily before meals. Qty: 100 each, Refills: 11      STOP taking these medications     pravastatin (PRAVACHOL) 40 MG tablet          Follow-up Information    [name],KELLIE A, MD Follow up.   Specialty:  Nephrology Contact information: [address] [place] [address] [phone]           TOTAL DISCHARGE TIME: 35  mins  [facility]  [facility] Pager [phone]  01/01/2016, 2:05 PM

## 2016-01-01 NOTE — Discharge Instructions (Signed)
Peritonitis °Peritonitis is inflammation of the peritoneum. The peritoneum is the tissue that lines the abdomen and covers the internal organs. Certain conditions or injuries can cause organs to leak stool, bacteria, fungi, or chemicals, such as bile or other digestive fluids, into the abdomen. When these substances come into contact with the peritoneum, they may cause irritation or infection. Peritonitis can be a life-threatening infection if not treated promptly. The infection can progress to involve the whole body (sepsis). °CAUSES  °Many conditions can cause peritonitis, including: °· Appendicitis. °· Pancreatitis. °· Diverticulitis. °· Ulcers. °· Crohn disease or ulcerative colitis. °· Cancer. °· Liver disease. °· Tuberculosis. °Other possible causes are:  °· Injury, such as: °¨ Abdominal injury. °¨ Injury to the stomach or esophagus. °· Other infections inside the abdomen or pelvis. °· Peritoneal dialysis. This is a procedure used to cleanse the blood when the kidneys are unable to do so. °SIGNS AND SYMPTOMS  °· Severe abdominal pain. °· Hard-feeling abdomen. °· Abdominal swelling. °· Fever and chills. °· Nausea and vomiting. °· Poor appetite or no appetite. °· Inability to pass gas or stool. °· Diarrhea. °· Less frequent urination. °DIAGNOSIS  °Your health care provider will perform a physical exam and take your medical history. Other tests that are done may include: °· Blood tests. °· Urinalysis. °· Stool analysis, if you have associated diarrhea. °· Paracentesis. °· X-ray. °· Ultrasound. °· CT scans. °TREATMENT  °Treatment for peritonitis requires treating the symptoms and also the underlying cause of peritonitis. Treatment may include: °· Antibiotic medicines. °· Surgery to remove infected fluid and tissue. °· Surgery to treat conditions that cause peritonitis, such as an appendectomy for appendicitis. °HOME CARE INSTRUCTIONS  °· Take medicines only as directed by your health care provider. °· If you were  prescribed an antibiotic medicine, finish it all even if you start to feel better. °· If you were taking prescription medicines for other problems before you developed peritonitis, ask about when you should re-start these medicines. °· Follow your health care provider's instructions for diet and activity. °· Drink enough fluid to keep your urine clear or pale yellow. °· If directed, use a stool softener or laxative. °· Rest as much as possible. °SEEK MEDICAL CARE IF: °You have a fever. °SEEK IMMEDIATE MEDICAL CARE IF: °· You have severe abdominal pain or tenderness. °· You have abdominal swelling. °· You start to have the chills. °· You have new problems with passing urine. °· You develop chest pains or shortness of breath. °· You have nausea, vomiting, or diarrhea. °· You are unable to pass gas or stool. °  °This information is not intended to replace advice given to you by your health care provider. Make sure you discuss any questions you have with your health care provider. °  °Document Released: 01/24/2008 Document Revised: 03/03/2014 Document Reviewed: 09/23/2013 °Elsevier Interactive Patient Education ©2016 [employer] ° °

## 2016-01-01 NOTE — Progress Notes (Signed)
Harold HedgeSteven Betten to be D/C'd Home per MD order.  Discussed prescriptions and follow up appointments with the patient. Prescriptions given to patient, medication list explained in detail. Pt verbalized understanding.    Medication List    STOP taking these medications   pravastatin 40 MG tablet Commonly known as:  PRAVACHOL     TAKE these medications   aspirin EC 81 MG tablet Take 81 mg by mouth daily.   COREG 12.5 MG tablet Generic drug:  carvedilol Take 12.5 mg by mouth 2 (two) times daily.   glucose blood test strip Commonly known as:  RELION PRIME TEST Use as instructed   hydrALAZINE 25 MG tablet Commonly known as:  APRESOLINE Take 25 mg by mouth 2 (two) times daily.   HYDROcodone-acetaminophen 5-325 MG tablet Commonly known as:  NORCO Take 1 tablet by mouth every 6 (six) hours as needed for moderate pain.   ibuprofen 200 MG tablet Commonly known as:  ADVIL,MOTRIN Take 400 mg by mouth every 6 (six) hours as needed for headache (pain).   IMDUR 30 MG 24 hr tablet Generic drug:  isosorbide mononitrate Take 30 mg by mouth daily.   Insulin Lispro Prot & Lispro (75-25) 100 UNIT/ML Kwikpen Commonly known as:  HUMALOG MIX 75/25 KWIKPEN Inject 12 Units into the skin 2 (two) times daily. What changed:  how much to take   Insulin Pen Needle 31G X 5 MM Misc 1 Units by Does not apply route as needed.   labetalol 100 MG tablet Commonly known as:  NORMODYNE Take 1 tablet (100 mg total) by mouth 2 (two) times daily.   multivitamin Tabs tablet Take 1 tablet by mouth daily.   oxyCODONE-acetaminophen 5-325 MG tablet Commonly known as:  ROXICET Take 1 tablet by mouth every 6 (six) hours as needed for severe pain.   RELION LANCETS THIN 26G Misc 1 Units by Does not apply route 3 (three) times daily before meals.   RELION PRIME MONITOR Devi 1 Units by Does not apply route 3 (three) times daily before meals.   sevelamer carbonate 800 MG tablet Commonly known as:  RENVELA Take  1,600-4,000 mg by mouth See admin instructions. Take 5 tablets (4000 mg) by mouth with meals and 2 tablets (1600 mg) with snacks   simvastatin 40 MG tablet Commonly known as:  ZOCOR Take 40 mg by mouth at bedtime.   temazepam 15 MG capsule Commonly known as:  RESTORIL Take 15 mg by mouth at bedtime as needed for sleep.   UNABLE TO FIND Vancomycin and Ancef to be given intraperitoneally starting 01/02/16 at home per Dr. Arrie Aranoladonato.       Vitals:   01/01/16 0553 01/01/16 0837  BP: (!) 150/74 115/65  Pulse: 60 (!) 58  Resp: 18   Temp: 98.2 F (36.8 C) 98.3 F (36.8 C)    Skin clean, dry and intact without evidence of skin break down, no evidence of skin tears noted. IV catheter discontinued intact. Site without signs and symptoms of complications. Dressing and pressure applied. Pt denies pain at this time. No complaints noted.  An After Visit Summary was printed and given to the patient. Patient escorted via WC, and D/C home via private auto.  Mariann BarterKellie Leana Springston BSN, RN [facility] Phone [phone]

## 2016-01-02 LAB — CULTURE, BODY FLUID W GRAM STAIN -BOTTLE

## 2016-01-02 LAB — CULTURE, BODY FLUID-BOTTLE

## 2016-01-02 LAB — PATHOLOGIST SMEAR REVIEW

## 2016-01-04 LAB — CULTURE, BLOOD (ROUTINE X 2)
CULTURE: NO GROWTH
CULTURE: NO GROWTH

## 2016-01-06 LAB — CULTURE, BODY FLUID W GRAM STAIN -BOTTLE: Culture: NO GROWTH

## 2016-01-06 LAB — CULTURE, BODY FLUID-BOTTLE

## 2016-03-29 ENCOUNTER — Encounter (HOSPITAL_COMMUNITY): Payer: Self-pay

## 2016-03-29 ENCOUNTER — Emergency Department (HOSPITAL_COMMUNITY)
Admission: EM | Admit: 2016-03-29 | Discharge: 2016-03-30 | Disposition: A | Discharge status: Home / Self Care | Payer: 59 | Attending: Emergency Medicine | Admitting: Emergency Medicine

## 2016-03-29 DIAGNOSIS — Z79899 Other long term (current) drug therapy: Secondary | ICD-10-CM | POA: Insufficient documentation

## 2016-03-29 DIAGNOSIS — Z794 Long term (current) use of insulin: Secondary | ICD-10-CM | POA: Insufficient documentation

## 2016-03-29 DIAGNOSIS — I129 Hypertensive chronic kidney disease with stage 1 through stage 4 chronic kidney disease, or unspecified chronic kidney disease: Secondary | ICD-10-CM | POA: Insufficient documentation

## 2016-03-29 DIAGNOSIS — N184 Chronic kidney disease, stage 4 (severe): Secondary | ICD-10-CM | POA: Insufficient documentation

## 2016-03-29 DIAGNOSIS — R1084 Generalized abdominal pain: Secondary | ICD-10-CM | POA: Diagnosis present

## 2016-03-29 DIAGNOSIS — E1122 Type 2 diabetes mellitus with diabetic chronic kidney disease: Secondary | ICD-10-CM | POA: Diagnosis not present

## 2016-03-29 DIAGNOSIS — F17290 Nicotine dependence, other tobacco product, uncomplicated: Secondary | ICD-10-CM | POA: Diagnosis not present

## 2016-03-29 DIAGNOSIS — Z7982 Long term (current) use of aspirin: Secondary | ICD-10-CM | POA: Insufficient documentation

## 2016-03-29 DIAGNOSIS — Z992 Dependence on renal dialysis: Secondary | ICD-10-CM | POA: Diagnosis not present

## 2016-03-29 LAB — CBC
HCT: 38.3 % — ABNORMAL LOW (ref 39.0–52.0)
Hemoglobin: 13.2 g/dL (ref 13.0–17.0)
MCH: 31 pg (ref 26.0–34.0)
MCHC: 34.5 g/dL (ref 30.0–36.0)
MCV: 89.9 fL (ref 78.0–100.0)
Platelets: 220 10*3/uL (ref 150–400)
RBC: 4.26 MIL/uL (ref 4.22–5.81)
RDW: 13.1 % (ref 11.5–15.5)
WBC: 7.2 10*3/uL (ref 4.0–10.5)

## 2016-03-29 LAB — URINALYSIS, ROUTINE W REFLEX MICROSCOPIC
Bilirubin Urine: NEGATIVE
Glucose, UA: >=500 mg/dL — AB
Ketones, ur: NEGATIVE mg/dL
Leukocytes, UA: NEGATIVE
Nitrite: NEGATIVE
Protein, ur: >=300 mg/dL — AB
Specific Gravity, Urine: 1.014 (ref 1.005–1.030)
pH: 5 (ref 5.0–8.0)

## 2016-03-29 LAB — COMPREHENSIVE METABOLIC PANEL WITH GFR
ALT: 13 U/L — ABNORMAL LOW (ref 17–63)
AST: 18 U/L (ref 15–41)
Albumin: 3 g/dL — ABNORMAL LOW (ref 3.5–5.0)
Alkaline Phosphatase: 65 U/L (ref 38–126)
Anion gap: 17 — ABNORMAL HIGH (ref 5–15)
BUN: 60 mg/dL — ABNORMAL HIGH (ref 6–20)
CO2: 26 mmol/L (ref 22–32)
Calcium: 9.1 mg/dL (ref 8.9–10.3)
Chloride: 91 mmol/L — ABNORMAL LOW (ref 101–111)
Creatinine, Ser: 9.11 mg/dL — ABNORMAL HIGH (ref 0.61–1.24)
GFR calc Af Amer: 7 mL/min — ABNORMAL LOW
GFR calc non Af Amer: 6 mL/min — ABNORMAL LOW
Glucose, Bld: 218 mg/dL — ABNORMAL HIGH (ref 65–99)
Potassium: 3.9 mmol/L (ref 3.5–5.1)
Sodium: 134 mmol/L — ABNORMAL LOW (ref 135–145)
Total Bilirubin: 0.6 mg/dL (ref 0.3–1.2)
Total Protein: 6.8 g/dL (ref 6.5–8.1)

## 2016-03-29 LAB — LACTATE DEHYDROGENASE, PLEURAL OR PERITONEAL FLUID: LD, Fluid: 682 U/L — ABNORMAL HIGH (ref 3–23)

## 2016-03-29 LAB — LIPASE, BLOOD: Lipase: 15 U/L (ref 11–51)

## 2016-03-29 MED ORDER — FENTANYL CITRATE (PF) 100 MCG/2ML IJ SOLN
50.0000 ug | Freq: Once | INTRAMUSCULAR | Status: AC
  Administered 2016-03-29: 50 ug via INTRAVENOUS
  Filled 2016-03-29: qty 2

## 2016-03-29 MED ORDER — OXYCODONE-ACETAMINOPHEN 5-325 MG PO TABS
1.0000 | ORAL_TABLET | Freq: Once | ORAL | Status: AC
  Administered 2016-03-30: 1 via ORAL
  Filled 2016-03-29: qty 1

## 2016-03-29 MED ORDER — ONDANSETRON 4 MG PO TBDP
4.0000 mg | ORAL_TABLET | Freq: Once | ORAL | Status: AC | PRN
  Administered 2016-03-29: 4 mg via ORAL

## 2016-03-29 MED ORDER — CEFTAZIDIME 200 MG/ML FOR DIALYSIS
2000.0000 mg | INJECTION | Freq: Once | INTRAVENOUS_CENTRAL | Status: AC
  Administered 2016-03-30: 2000 mg via INTRAPERITONEAL
  Filled 2016-03-29: qty 10

## 2016-03-29 MED ORDER — ONDANSETRON 4 MG PO TBDP
ORAL_TABLET | ORAL | Status: AC
  Filled 2016-03-29: qty 1

## 2016-03-29 MED ORDER — VANCOMYCIN 100 MG/ML FOR DIALYSIS
2000.0000 mg | INJECTION | Freq: Once | Status: AC
  Administered 2016-03-30: 2000 mg via INTRAPERITONEAL
  Filled 2016-03-29: qty 20

## 2016-03-29 MED ORDER — DELFLEX-LC/1.5% DEXTROSE 344 MOSM/L IP SOLN
2.0000 L | Freq: Once | INTRAPERITONEAL | Status: AC
  Administered 2016-03-30: 2000 mL via INTRAPERITONEAL

## 2016-03-29 NOTE — ED Provider Notes (Signed)
[facility]-EMERGENCY DEPT Provider Note   CSN: [id number] Arrival date & time: 03/29/16  1911     History   Chief Complaint Chief Complaint  Patient presents with  . Abdominal Pain    HPI Austin Mcdaniel is a 46 y.o. male.  The history is provided by the patient. No language interpreter was used.  Abdominal Pain      Austin Mcdaniel is a 46 y.o. male who presents to the Emergency Department complaining of abdominal pain.  Patient with history of kidney disease on peritoneal dialysis at home here for evaluation of abdominal pain. ED about 3:00pm he developed generalized abdominal pain with emesis 2. He reports associated malaise and has a temperature of 98, which is high for him. He has a history of bacterial peritonitis back in November the presented with similar symptoms. Following his last 4 hour dwell he was taking off the dialysate about 2 to 2:30 and noticed it was cloudy and had fibrous material in it.  Past Medical History:  Diagnosis Date  . Acid reflux   . Anemia   . Chronic kidney disease    CKD STAGE 4  . Diabetic foot ulcer (HCC) 06/2011; 10/2011-12/2011   left dorsal foot  (01/03/2012)  . Hypertension   . Osteomyelitis of left foot (HCC)   . Peritoneal dialysis status (HCC)   . Renal insufficiency   . Spinal headache   . Type II diabetes mellitus [facility])     Patient Active Problem List   Diagnosis Date Noted  . Peritonitis (HCC) 12/31/2015  . Bacterial peritonitis (HCC) 12/30/2015  . Chronic kidney disease (CKD), stage IV (severe) (HCC) 05/24/2014  . Vision changes 08/22/2013  . Nephropathy 10/06/2012  . Abnormal presence of protein in urine 08/17/2012  . Abnormality of gait 06/01/2012  . Insomnia 05/12/2012  . S/P BKA (below knee amputation) unilateral (HCC) 01/12/2012  . HTN (hypertension) 06/25/2010  . HLD (hyperlipidemia) 06/25/2010  . History of tobacco abuse 06/25/2010  . DM type 2 causing vascular disease (HCC) 05/26/1994    Past Surgical History:    Procedure Laterality Date  . AMPUTATION  01/06/2012   Procedure: AMPUTATION BELOW KNEE;  Surgeon: Toni Arthurs, MD;  Location: [facility] OR;  Service: Orthopedics;  Laterality: Left;  DR.HEWITT WOULD LIKE TO FOLLOW HIS OTHER AFTERNOON CASE STARTING AROUND 1700-1715  . AV FISTULA PLACEMENT Left 08/15/2014   Procedure: RADIOCEPHALIC ARTERIOVENOUS Left FISTULA CREATION;  Surgeon: Chuck Hint, MD;  Location: [facility] OR;  Service: Vascular;  Laterality: Left;  . INSERTION OF DIALYSIS CATHETER N/A 08/15/2014   Procedure: INSERTION OF DIALYSIS CATHETER;  Surgeon: Chuck Hint, MD;  Location[facility] OR;  Service: Vascular;  Laterality: N/A;  . TOE AMPUTATION  2009; 2012?   left great; left 2nd toe       Home Medications    Prior to Admission medications   Medication Sig Start Date End Date Taking? Authorizing Provider  aspirin EC 81 MG tablet Take 81 mg by mouth daily.   Yes Historical Provider, MD  carvedilol (COREG) 12.5 MG tablet Take 12.5 mg by mouth 2 (two) times daily. 03/01/15  Yes Historical Provider, MD  hydrALAZINE (APRESOLINE) 25 MG tablet Take 25 mg by mouth 2 (two) times daily. 03/01/15  Yes Historical Provider, MD  ibuprofen (ADVIL,MOTRIN) 200 MG tablet Take 400 mg by mouth every 6 (six) hours as needed for headache (pain).   Yes Historical Provider, MD  Insulin Lispro Prot & Lispro (HUMALOG MIX 75/25 KWIKPEN) (75-25) 100 UNIT/ML [employer]  Inject 12 Units into the skin 2 (two) times daily. Patient taking differently: Inject 10 Units into the skin 2 (two) times daily.  06/02/14  Yes Gust Rung, DO  isosorbide mononitrate (IMDUR) 30 MG 24 hr tablet Take 30 mg by mouth daily. 03/01/15  Yes Historical Provider, MD  multivitamin (RENA-VIT) TABS tablet Take 1 tablet by mouth every morning.    Yes Historical Provider, MD  sevelamer carbonate (RENVELA) 800 MG tablet Take 1,600-4,000 mg by mouth See admin instructions. Take 5 tablets (4000 mg) by mouth with meals and 2 tablets (1600 mg) with  snacks   Yes Historical Provider, MD  simvastatin (ZOCOR) 40 MG tablet Take 40 mg by mouth at bedtime.   Yes Historical Provider, MD  traMADol (ULTRAM) 50 MG tablet Take 50 mg by mouth every 6 (six) hours as needed.   Yes Historical Provider, MD  labetalol (NORMODYNE) 100 MG tablet Take 1 tablet (100 mg total) by mouth 2 (two) times daily. Patient not taking: Reported on 12/30/2015 06/02/14   Gust Rung, DO  ondansetron (ZOFRAN ODT) 4 MG disintegrating tablet Take 1 tablet (4 mg total) by mouth every 8 (eight) hours as needed for nausea or vomiting. 03/30/16   Tilden Fossa, MD  oxyCODONE-acetaminophen (PERCOCET/ROXICET) 5-325 MG tablet Take 1 tablet by mouth every 6 (six) hours as needed for severe pain. 03/30/16   Tilden Fossa, MD    Family History Family History  Problem Relation Age of Onset  . Hypertension Other     Social History Social History  Substance Use Topics  . Smoking status: Light Tobacco Smoker    Packs/day: 0.10    Years: 7.00    Types: Cigars  . Smokeless tobacco: Current User    Types: Snuff     Comment: 01/03/2012 "quit cigarettes 15-20 yr ago; occasionally smoke a pipe; dip rarely - last time ~ 3 months ago"  07/2013 "occasional cigar"  . Alcohol use 1.2 oz/week    2 Cans of beer per week     Comment: social     Allergies   Norvasc [amlodipine besylate]   Review of Systems Review of Systems  Gastrointestinal: Positive for abdominal pain.  All other systems reviewed and are negative.    Physical Exam Updated Vital Signs BP 127/72   Pulse 69   Temp 98.3 F (36.8 C) (Oral)   Resp 18   SpO2 92%   Physical Exam  Constitutional: He is oriented to person, place, and time. He appears well-developed and well-nourished.  HENT:  Head: Normocephalic and atraumatic.  Cardiovascular: Normal rate and regular rhythm.   No murmur heard. Pulmonary/Chest: Effort normal and breath sounds normal. No respiratory distress.  Abdominal: Soft. There is no rebound  and no guarding.  Moderate diffuse abdominal tenderness with PD catheter in place.   Musculoskeletal: He exhibits no edema or tenderness.  Left BKA  Neurological: He is alert and oriented to person, place, and time.  Skin: Skin is warm and dry. There is pallor.  Psychiatric: He has a normal mood and affect. His behavior is normal.  Nursing note and vitals reviewed.    ED Treatments / Results  Labs (all labs ordered are listed, but only abnormal results are displayed) Labs Reviewed  COMPREHENSIVE METABOLIC PANEL - Abnormal; Notable for the following:       Result Value   Sodium 134 (*)    Chloride 91 (*)    Glucose, Bld 218 (*)    BUN 60 (*)  Creatinine, Ser 9.11 (*)    Albumin 3.0 (*)    ALT 13 (*)    GFR calc non Af Amer 6 (*)    GFR calc Af Amer 7 (*)    Anion gap 17 (*)    All other components within normal limits  CBC - Abnormal; Notable for the following:    HCT 38.3 (*)    All other components within normal limits  URINALYSIS, ROUTINE W REFLEX MICROSCOPIC - Abnormal; Notable for the following:    APPearance HAZY (*)    Glucose, UA >=500 (*)    Hgb urine dipstick SMALL (*)    Protein, ur >=300 (*)    Bacteria, UA RARE (*)    Squamous Epithelial / LPF 0-5 (*)    All other components within normal limits  BODY FLUID CELL COUNT WITH DIFFERENTIAL - Abnormal; Notable for the following:    Appearance, Fluid TURBID (*)    WBC, Fluid 113,120 (*)    Neutrophil Count, Fluid 99 (*)    Monocyte-Macrophage-Serous Fluid 1 (*)    All other components within normal limits  LACTATE DEHYDROGENASE, BODY FLUID - Abnormal; Notable for the following:    LD, Fluid 682 (*)    All other components within normal limits  BODY FLUID CULTURE  LIPASE, BLOOD    EKG  EKG Interpretation None       Radiology No results found.  Procedures Procedures (including critical care time)  Medications Ordered in ED Medications  ondansetron (ZOFRAN-ODT) 4 MG disintegrating tablet (not  administered)  vancomycin (VANCOCIN) 100 mg/mL injection 2,000 mg (not administered)  cefTAZidime (FORTAZ) 200 mg/mL injection 2,000 mg (not administered)  dialysis solution 1.5% low-MG/low-CA SOLN 2 L (not administered)  ondansetron (ZOFRAN-ODT) disintegrating tablet 4 mg (4 mg Oral Given 03/29/16 1927)  fentaNYL (SUBLIMAZE) injection 50 mcg (50 mcg Intravenous Given 03/29/16 2205)  oxyCODONE-acetaminophen (PERCOCET/ROXICET) 5-325 MG per tablet 1 tablet (1 tablet Oral Given 03/30/16 0000)     Initial Impression / Assessment and Plan / ED Course  I have reviewed the triage vital signs and the nursing notes.  Pertinent labs & imaging results that were available during my care of the patient were reviewed by me and considered in my medical decision making (see chart for details).    Pt with ESRD on PD at home here with abdominal pain similar to prior episode of peritonitis.  Concern for recurrent peritonitis based on presentation.  He is well hydrated and nontoxic on exam.  D/w Dr. Darrick Pennaeterding with Nephrology, recommends 2L 1.5% with 2gm vancomycin and 2 gm fortaz in PD catheter and dwell for 6 hours before removing with follow up with PD nurse on Monday.  D/w pt findings of studies and discussion with Dr. Darrick Pennaeterding.  Discussed home care and outpatient follow up as well as close return precautions for new or worrisome sxs.    Final Clinical Impressions(s) / ED Diagnoses   Final diagnoses:  Generalized abdominal pain  Peritoneal dialysis status (HCC)    New Prescriptions New Prescriptions   ONDANSETRON (ZOFRAN ODT) 4 MG DISINTEGRATING TABLET    Take 1 tablet (4 mg total) by mouth every 8 (eight) hours as needed for nausea or vomiting.   OXYCODONE-ACETAMINOPHEN (PERCOCET/ROXICET) 5-325 MG TABLET    Take 1 tablet by mouth every 6 (six) hours as needed for severe pain.     Tilden FossaElizabeth Breland Elders, MD 03/30/16 1240

## 2016-03-29 NOTE — ED Notes (Signed)
[facility] to request she administer medications when available.

## 2016-03-29 NOTE — ED Notes (Signed)
Relayed lab results to MD.

## 2016-03-29 NOTE — ED Notes (Signed)
6E RN at bedside to obtain peritoneal fluid for testing

## 2016-03-29 NOTE — ED Notes (Signed)
Per provider, contacted the dialysis RN to collect labs. Cala BradfordKimberly RN stated she will be down.

## 2016-03-29 NOTE — ED Triage Notes (Signed)
Onset today 3pm abd pain, vomiting x 2.

## 2016-03-30 DIAGNOSIS — R1084 Generalized abdominal pain: Secondary | ICD-10-CM | POA: Diagnosis not present

## 2016-03-30 LAB — BODY FLUID CELL COUNT WITH DIFFERENTIAL
Eos, Fluid: 0 %
LYMPHS FL: 0 %
MONOCYTE-MACROPHAGE-SEROUS FLUID: 1 % — AB (ref 50–90)
Neutrophil Count, Fluid: 99 % — ABNORMAL HIGH (ref 0–25)
Total Nucleated Cell Count, Fluid: 113120 cu mm — ABNORMAL HIGH (ref 0–1000)

## 2016-03-30 MED ORDER — FENTANYL CITRATE (PF) 100 MCG/2ML IJ SOLN
50.0000 ug | Freq: Once | INTRAMUSCULAR | Status: AC
  Administered 2016-03-30: 50 ug via INTRAVENOUS
  Filled 2016-03-30: qty 2

## 2016-03-30 MED ORDER — ONDANSETRON 4 MG PO TBDP: 4.0000 mg | ORAL_TABLET | Freq: Three times a day (TID) | ORAL | 0 refills | Status: AC | PRN

## 2016-03-30 MED ORDER — OXYCODONE-ACETAMINOPHEN 5-325 MG PO TABS: 1.0000 | ORAL_TABLET | Freq: Four times a day (QID) | ORAL | 0 refills | Status: AC | PRN

## 2016-03-30 NOTE — Progress Notes (Signed)
Called to draw peritoneal fluid off patient to send for cell count.  Per patient, he had already drained for the day.  However, he usually has 100 - 200 ml remaining.  Patient signed consent for procedure.  Able to withdraw approximately 50 cc of thick yellow peritoneal fluid that had multiple fibrin present.  This was taken to the laboratory for evaluation.  Bernie CoveyKimberly Elster Corbello RN-BC, [employer]igroupWTA

## 2016-03-30 NOTE — Discharge Instructions (Signed)
Keep your dialysis fluid in for six hours today before removing.  Follow up with the Dialysis Home Team nurse on Monday.  Get rechecked immediately if you develop high fevers, uncontrolled pain, or new worrisome symptoms.

## 2016-03-30 NOTE — Progress Notes (Signed)
Received order to infuse 2 liters of 1.5% low mag/low ca+ peritoneal dialysate fluid into patient's peritoneal cavity.  To this fluid, vancomycin and fortaz were added.  At 0135, 50 mcg of IV Fentanyl was given to patient for abdominal pain.  As patient would fall asleep, oxygen levels would drop as low as 77% on room air.  The levels would come back to normal limits once patient awakened.  Patient's primary nurse and MD made aware.  O2 at 2 LPM was put on patient.  Patient tolerated the infusion of the peritoneal dialysate fluid without complications.  [facility] [employer] MinorKimberly Fidelia Cathers RN-BC, WTA.

## 2016-04-01 LAB — PATHOLOGIST SMEAR REVIEW

## 2016-04-02 LAB — BODY FLUID CULTURE: GRAM STAIN: NONE SEEN

## 2016-04-03 ENCOUNTER — Telehealth (HOSPITAL_BASED_OUTPATIENT_CLINIC_OR_DEPARTMENT_OTHER): Payer: Self-pay

## 2016-04-03 NOTE — Telephone Encounter (Signed)
Post ED Visit - Positive Culture Follow-up  Culture report reviewed by antimicrobial stewardship pharmacist:  []  Austin Mcdaniel, Pharm.D. []  Austin Mcdaniel, Pharm.D., BCPS []  Austin Mcdaniel, Pharm.D. []  Austin Mcdaniel, Pharm.D., BCPS []  Austin Mcdaniel, [address]Ph[address]D., BCPS, AAHIVP []  Austin Mcdaniel, Pharm.D., BCPS, AAHIVP []  Austin Mcdaniel, Pharm.D. []  Austin Mcdaniel, [address]Ph[address]  Positive Perotoneal fluid  Culture -> abundant serratia marcescens Pt receiving Elita QuickFortaz and vancomycin outpatient  Austin Mcdaniel, Austin Mcdaniel 04/03/2016, 11:36 AM

## 2016-12-28 IMAGING — CT CT ABD-PELV W/ CM
2 of 5 series · 8 of 46 positions shown, 9 images · IV contrast (Iodine)
Comparison: None.

CLINICAL DATA: Right lower quadrant abdominal pain since last
night.

EXAM:
CT ABDOMEN AND PELVIS WITH CONTRAST
TECHNIQUE: Multidetector CT imaging of the abdomen and pelvis was performed
using the standard protocol following bolus administration of
intravenous contrast.
CONTRAST:  100mL FASJXI-655 IOPAMIDOL (FASJXI-655) INJECTION 61%

[Series 201: routine, idose (2) · axial · 0.94mm/px · z∈[+118,+543]mm · 5 of 107 slices shown, 6 images]
[im 11/107  soft-tissue]
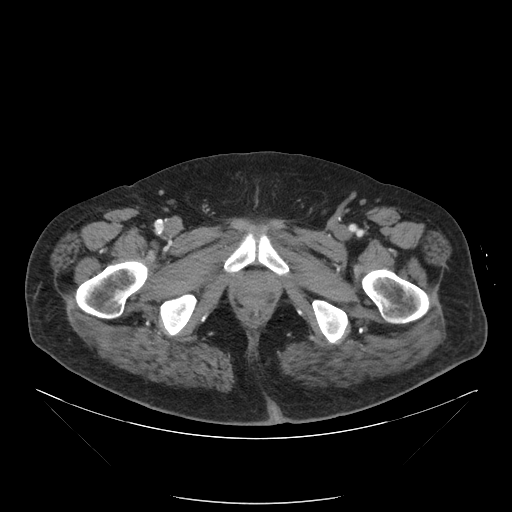
[im 11/107  bone]
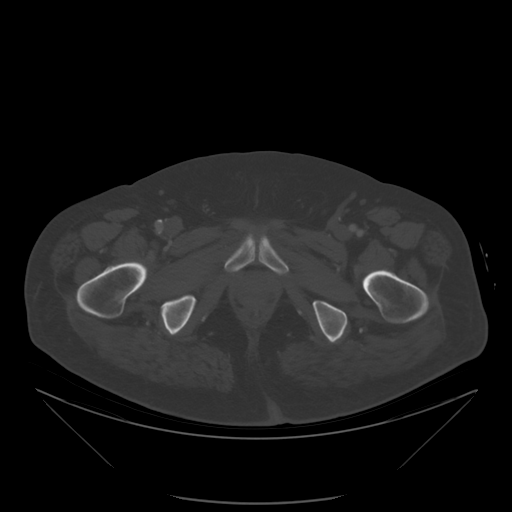
[im 32/107  soft-tissue]
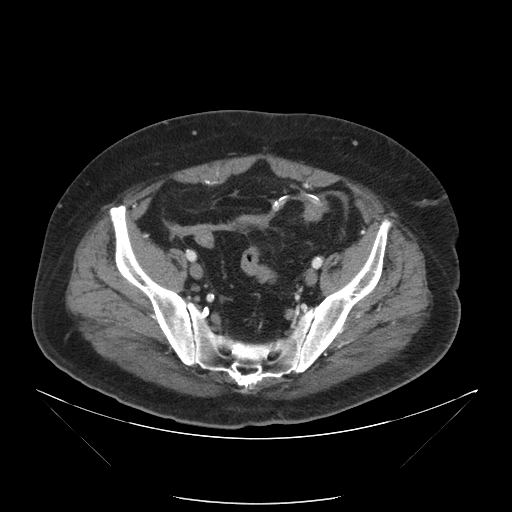
[im 54/107  soft-tissue]
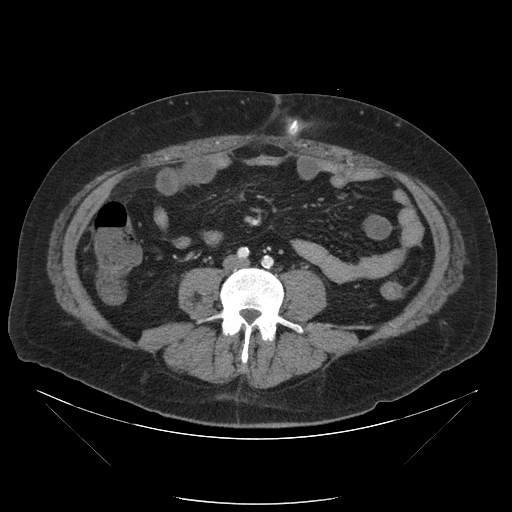
[im 75/107  soft-tissue]
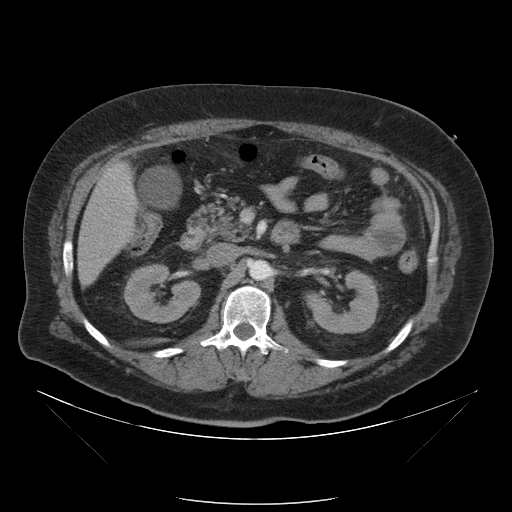
[im 96/107  soft-tissue]
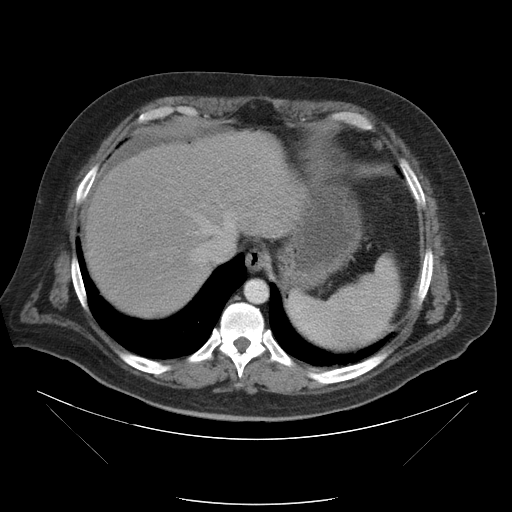

[Series 207: coronals, idose (2) · coronal · 0.45mm/px · 3 of 132 slices shown]
[im 44/132  soft-tissue]
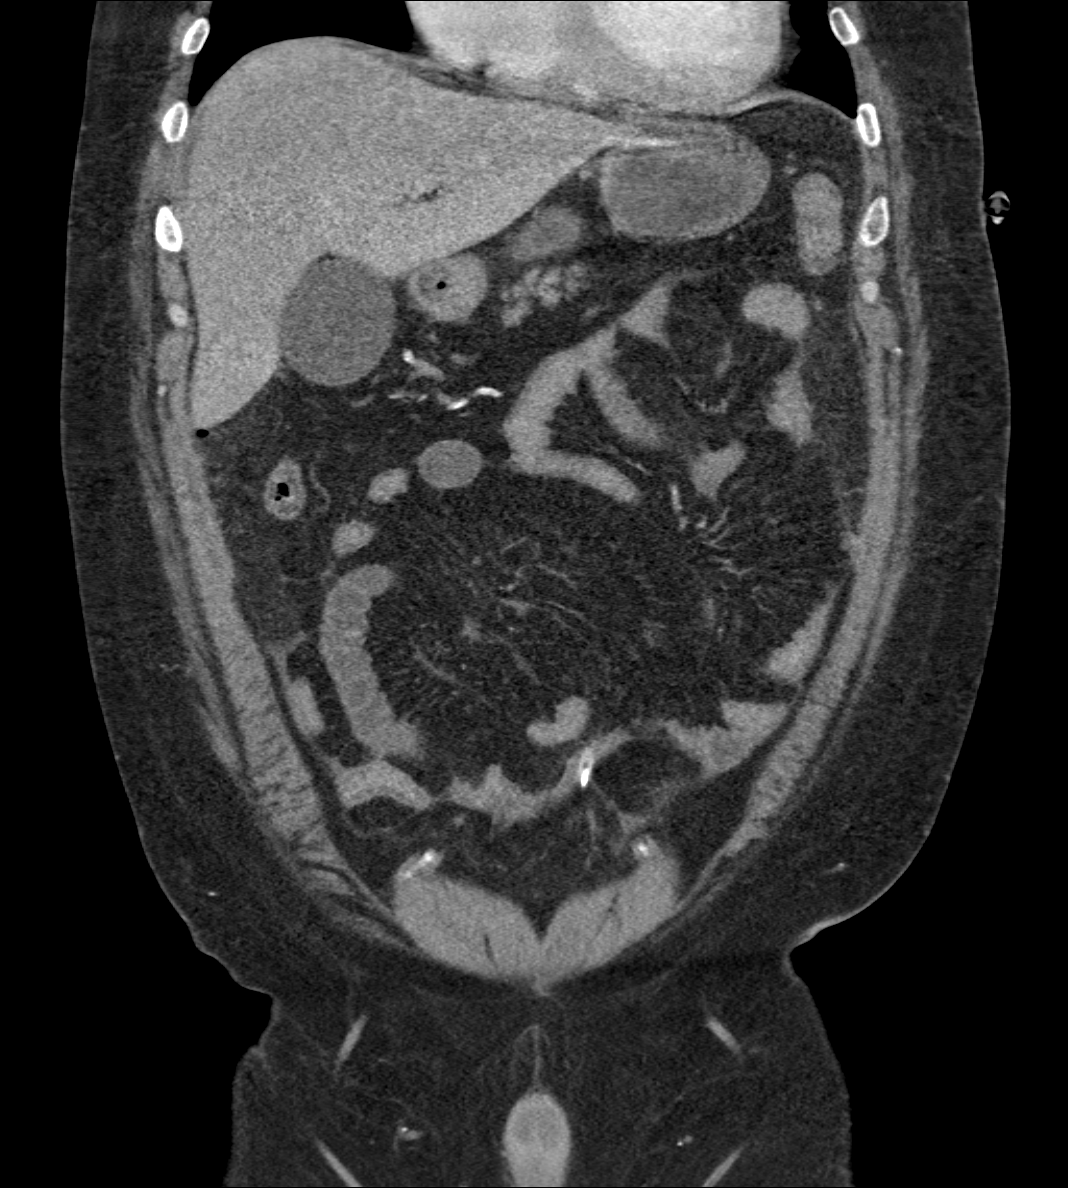
[im 59/132  soft-tissue]
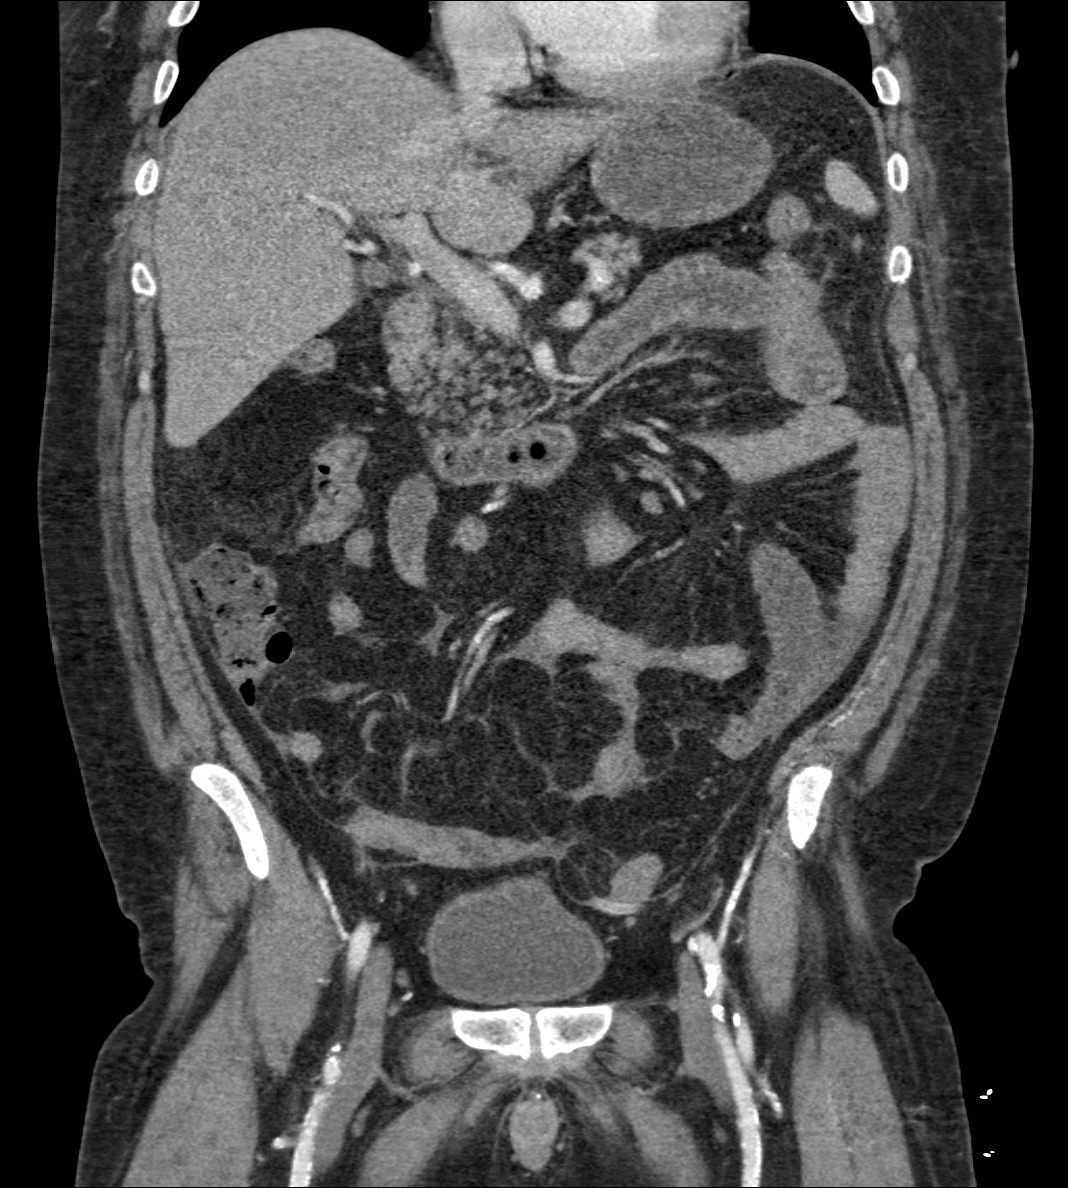
[im 73/132  soft-tissue]
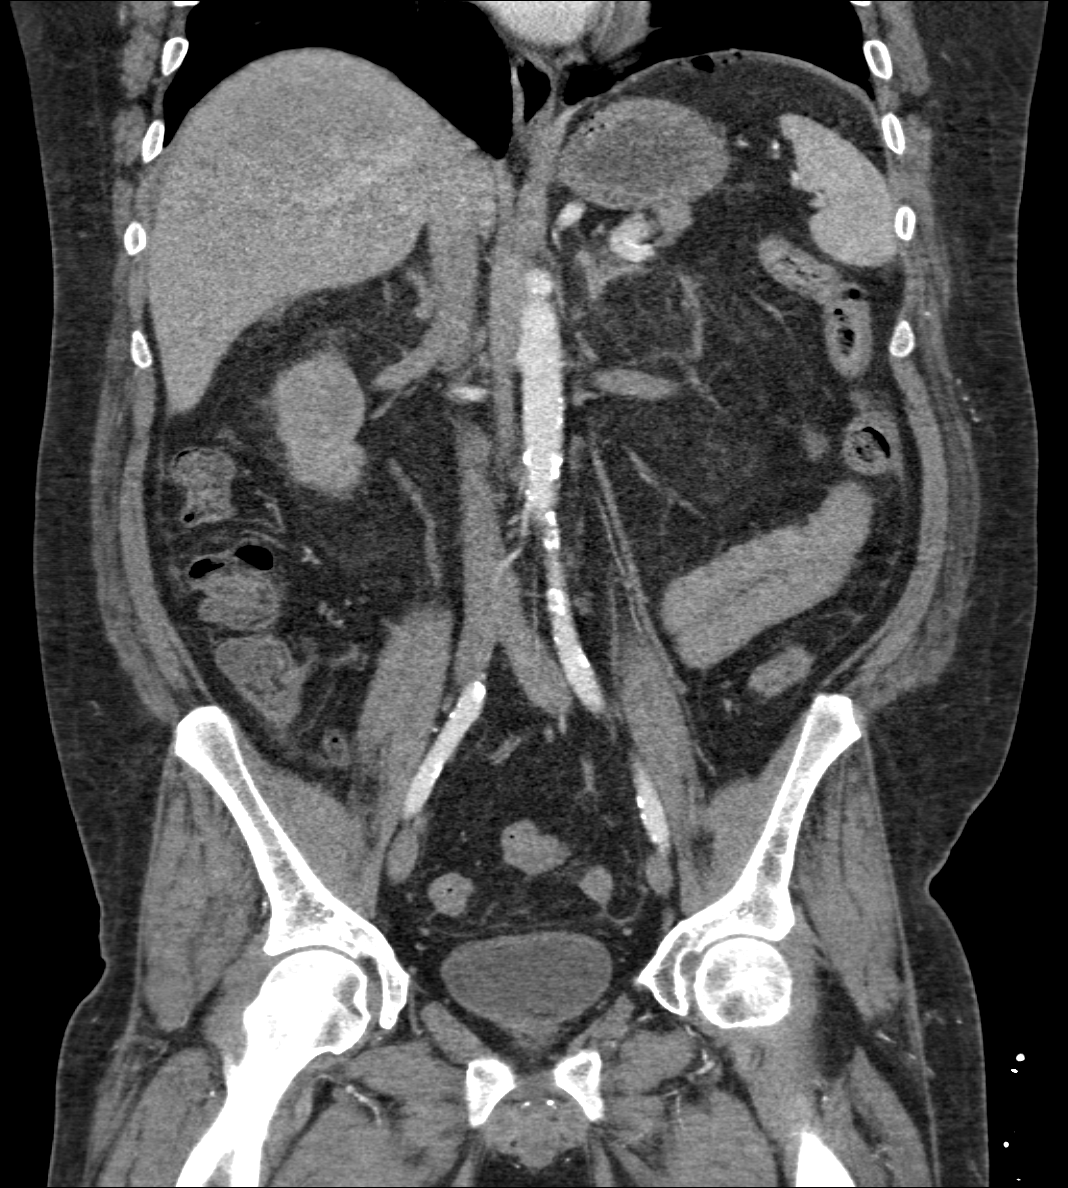

[8 of 46 positions shown; findings below may reference images not displayed]

FINDINGS: Lower chest:  No contributory findings.

Hepatobiliary: No focal liver abnormality.No evidence of biliary
obstruction or stone.

Pancreas: Unremarkable.

Spleen: Unremarkable.

Adrenals/Urinary Tract: Negative adrenals. Minimally enhancing
kidneys in this patient with end-stage renal disease. No
hydronephrosis or stone. Unremarkable bladder.

Stomach/Bowel:  No obstruction. No appendicitis.

Vascular/Lymphatic: No acute vascular abnormality. Diffuse arterial
calcification. No mass or adenopathy.

Reproductive:History of diabetes with vas deferens calcifications.

Other: Small ascites and randomly distributed pneumoperitoneum in
this patient with Tenckhoff catheter that is positioned in the
ventral low pelvis.

Musculoskeletal: No acute or aggressive finding.
IMPRESSION: 1. No appendicitis or other acute finding.
2. Small ascites and pneumoperitoneum as expected in this patient on
peritoneal dialysis.

## 2020-03-27 DEATH — deceased
